# Patient Record
Sex: Male | Born: 1935 | Race: White | Hispanic: No | Marital: Married | Smoking: Former smoker
Health system: Southern US, Community
[De-identification: ages and names within clinical notes are randomized; demographics above are authoritative.]

## PROBLEM LIST (undated history)

## (undated) DIAGNOSIS — I214 Non-ST elevation (NSTEMI) myocardial infarction: Secondary | ICD-10-CM

## (undated) DIAGNOSIS — C61 Malignant neoplasm of prostate: Secondary | ICD-10-CM

## (undated) DIAGNOSIS — Z8719 Personal history of other diseases of the digestive system: Secondary | ICD-10-CM

## (undated) DIAGNOSIS — I480 Paroxysmal atrial fibrillation: Secondary | ICD-10-CM

## (undated) DIAGNOSIS — E039 Hypothyroidism, unspecified: Secondary | ICD-10-CM

## (undated) DIAGNOSIS — E871 Hypo-osmolality and hyponatremia: Secondary | ICD-10-CM

## (undated) DIAGNOSIS — I5042 Chronic combined systolic (congestive) and diastolic (congestive) heart failure: Secondary | ICD-10-CM

## (undated) DIAGNOSIS — I119 Hypertensive heart disease without heart failure: Secondary | ICD-10-CM

## (undated) DIAGNOSIS — Z951 Presence of aortocoronary bypass graft: Secondary | ICD-10-CM

## (undated) DIAGNOSIS — I10 Essential (primary) hypertension: Secondary | ICD-10-CM

## (undated) DIAGNOSIS — I440 Atrioventricular block, first degree: Secondary | ICD-10-CM

## (undated) DIAGNOSIS — Z8521 Personal history of malignant neoplasm of larynx: Secondary | ICD-10-CM

## (undated) DIAGNOSIS — R498 Other voice and resonance disorders: Secondary | ICD-10-CM

## (undated) DIAGNOSIS — K219 Gastro-esophageal reflux disease without esophagitis: Secondary | ICD-10-CM

## (undated) DIAGNOSIS — I2510 Atherosclerotic heart disease of native coronary artery without angina pectoris: Secondary | ICD-10-CM

## (undated) DIAGNOSIS — M1990 Unspecified osteoarthritis, unspecified site: Secondary | ICD-10-CM

## (undated) DIAGNOSIS — R001 Bradycardia, unspecified: Secondary | ICD-10-CM

## (undated) HISTORY — DX: Hypertensive heart disease without heart failure: I11.9

## (undated) HISTORY — DX: Bradycardia, unspecified: R00.1

## (undated) HISTORY — DX: Hypo-osmolality and hyponatremia: E87.1

## (undated) HISTORY — DX: Paroxysmal atrial fibrillation: I48.0

## (undated) HISTORY — DX: Chronic combined systolic (congestive) and diastolic (congestive) heart failure: I50.42

## (undated) HISTORY — PX: LARYNX SURGERY: SHX692

## (undated) HISTORY — PX: CATARACT EXTRACTION W/ INTRAOCULAR LENS  IMPLANT, BILATERAL: SHX1307

## (undated) HISTORY — PX: CARDIOVASCULAR STRESS TEST: SHX262

## (undated) HISTORY — DX: Atherosclerotic heart disease of native coronary artery without angina pectoris: I25.10

---

## 1998-09-27 ENCOUNTER — Ambulatory Visit (HOSPITAL_BASED_OUTPATIENT_CLINIC_OR_DEPARTMENT_OTHER): Admission: RE | Admit: 1998-09-27 | Discharge: 1998-09-27 | Payer: Self-pay | Admitting: Otolaryngology

## 2011-01-04 ENCOUNTER — Encounter (INDEPENDENT_AMBULATORY_CARE_PROVIDER_SITE_OTHER): Payer: Medicare Other | Admitting: Ophthalmology

## 2011-01-04 DIAGNOSIS — H3530 Unspecified macular degeneration: Secondary | ICD-10-CM

## 2011-01-04 DIAGNOSIS — D492 Neoplasm of unspecified behavior of bone, soft tissue, and skin: Secondary | ICD-10-CM

## 2011-01-04 DIAGNOSIS — H43819 Vitreous degeneration, unspecified eye: Secondary | ICD-10-CM

## 2011-01-22 ENCOUNTER — Other Ambulatory Visit: Payer: Self-pay | Admitting: Urology

## 2011-01-22 ENCOUNTER — Ambulatory Visit (INDEPENDENT_AMBULATORY_CARE_PROVIDER_SITE_OTHER): Payer: Medicare Other | Admitting: Urology

## 2011-01-22 DIAGNOSIS — R972 Elevated prostate specific antigen [PSA]: Secondary | ICD-10-CM

## 2011-01-22 DIAGNOSIS — N400 Enlarged prostate without lower urinary tract symptoms: Secondary | ICD-10-CM

## 2011-03-05 ENCOUNTER — Other Ambulatory Visit (HOSPITAL_COMMUNITY): Payer: Self-pay | Admitting: Urology

## 2011-03-05 ENCOUNTER — Inpatient Hospital Stay (HOSPITAL_COMMUNITY): Admission: RE | Admit: 2011-03-05 | Discharge: 2011-03-05 | Payer: Medicare Other | Source: Ambulatory Visit

## 2011-03-05 ENCOUNTER — Ambulatory Visit (HOSPITAL_COMMUNITY)
Admission: RE | Admit: 2011-03-05 | Discharge: 2011-03-05 | Disposition: A | Discharge status: Home / Self Care | Payer: Medicare Other | Source: Ambulatory Visit | Attending: Urology | Admitting: Urology

## 2011-03-05 DIAGNOSIS — N4289 Other specified disorders of prostate: Secondary | ICD-10-CM | POA: Insufficient documentation

## 2011-03-05 NOTE — Op Note (Signed)
Preoperative diagnosis: Prostate nodule, elevated PSA  Postoperative diagnosis: Same  Principal procedure: Transrectal ultrasound and biopsy of the prostate  Surgeon: Romolo Sieling  Anesthesia: 2% lidocaine, 9 cc  Complications: None  Specimens: 12 needle biopsies of the prostate, to pathology  Brief history   77 year old male who presents for ultrasound and biopsy of the prostate. He was first seen in my office here in [place], [place] in late October, 2012. At that time he was noted to have a 4-5 mm left prostatic nodule and a PSA of 9.2. The patient is on 5 alpha reductase inhibitors. With this prostate nodule and elevating PSA, it was recommended that he undergo ultrasound and biopsy of the prostate. Risks and complications have been discussed with the patient which include but are not limited to blood in the urine, semen and stool. Additionally, a 1/200 chance of developing sepsis. He understands these and desires to proceed.  Description of procedure  afterinformed consent and timeout, the patient was placed in the left lateral decubitus position. The transrectal ultrasound probe was advanced into the rectum. Images were taken of the prostate both in transverse and sagittal dimensions. Prostate measured 50.1 mL. There was a hypoechoic area noted in the left prostate in the peripheral zone, encompassing the apex and mid regions of the prostate. Measure proximally 1-1.5 cm. No other specific lesions were seen in the prostate, with seminal vesicles appear normal. After local infiltration with 2% plain lidocaine, biopsies were taken x12 in the usual sextant pattern, including the aforementioned hypoechoic area.the patient tolerated the procedure well. He was then allowed to go home at his convenience, with followup to be provided over the phone.

## 2011-03-05 NOTE — Progress Notes (Signed)
Prostate biopsy procedure started at 1325.  10 cc of Lidocaine injected into prostate.  Thirteen samples obtained and sent to pathology.  Procedure end time 1335.  Pt tolerated procedure well.

## 2011-03-05 NOTE — Discharge Instructions (Signed)
Prostate Biopsy TRUS Biopsy BEFORE THE TEST   Do not take aspirin. Do not take any medicine that has aspirin in it 7 days before your biopsy.   You may be given a medicine to take on the day of your biopsy.   You may also be given a medicine or treatment to help you go poop (laxative or enema).  AFTER THE TEST  Only take medicine as told by your doctor.   It is normal to have some bleeding from your rectum for the first 5 days.   You may have blood in your pee (urine) or sperm.  Finding out the results of your test Ask when your test results will be ready. Make sure you get your test results. GET HELP RIGHT AWAY IF:  You have a temperature by mouth above 102 F (38.9 C), not controlled by medicine.   You have blood in your pee for more than 5 days.   You have a lot of blood in your pee.   You have bleeding from your rectum for more than 5 days or have a lot of blood in your poop (feces).   You have severe pain.  Document Released: 02/27/2009 Document Revised: 11/21/2010 Document Reviewed: 02/27/2009 [facility] Patient Information 2012 [place], [place].

## 2011-04-26 ENCOUNTER — Other Ambulatory Visit: Payer: Self-pay | Admitting: Urology

## 2011-04-26 DIAGNOSIS — C61 Malignant neoplasm of prostate: Secondary | ICD-10-CM

## 2011-05-09 ENCOUNTER — Telehealth: Payer: Self-pay | Admitting: *Deleted

## 2011-05-09 ENCOUNTER — Encounter (HOSPITAL_COMMUNITY): Payer: Self-pay

## 2011-05-09 ENCOUNTER — Encounter (HOSPITAL_COMMUNITY)
Admission: RE | Admit: 2011-05-09 | Discharge: 2011-05-09 | Disposition: A | Discharge status: Home / Self Care | Payer: Medicare Other | Source: Ambulatory Visit | Attending: Urology | Admitting: Urology

## 2011-05-09 ENCOUNTER — Ambulatory Visit (HOSPITAL_COMMUNITY)
Admission: RE | Admit: 2011-05-09 | Discharge: 2011-05-09 | Disposition: A | Discharge status: Home / Self Care | Payer: Medicare Other | Source: Ambulatory Visit | Attending: Urology | Admitting: Urology

## 2011-05-09 DIAGNOSIS — C61 Malignant neoplasm of prostate: Secondary | ICD-10-CM | POA: Insufficient documentation

## 2011-05-09 HISTORY — DX: Malignant neoplasm of prostate: C61

## 2011-05-09 MED ORDER — TECHNETIUM TC 99M MEDRONATE IV KIT
24.0000 | PACK | Freq: Once | INTRAVENOUS | Status: AC | PRN
  Administered 2011-05-09: 24 via INTRAVENOUS

## 2011-05-17 NOTE — Telephone Encounter (Signed)
xxx

## 2011-05-29 ENCOUNTER — Encounter: Payer: Self-pay | Admitting: Radiation Oncology

## 2011-05-29 NOTE — Progress Notes (Signed)
  Radiation Oncology         [phone] ________________________________  Name: [name] [name]             MRN: [id number]  Date: 05/29/2011  DOB: 05-Jul-1928  [name] [name],  [name] [name] has now started radiation in [name] [name] and will finish on 4/8.  MM   ________________________________  [name] [name], M.D.

## 2011-06-05 ENCOUNTER — Encounter: Payer: Self-pay | Admitting: Radiation Oncology

## 2011-06-11 ENCOUNTER — Other Ambulatory Visit: Payer: Self-pay | Admitting: Urology

## 2011-06-21 ENCOUNTER — Other Ambulatory Visit: Payer: Self-pay | Admitting: Radiation Oncology

## 2011-06-21 DIAGNOSIS — C61 Malignant neoplasm of prostate: Secondary | ICD-10-CM

## 2011-07-03 ENCOUNTER — Encounter: Payer: Self-pay | Admitting: Radiation Oncology

## 2011-07-03 NOTE — Progress Notes (Signed)
  Radiation Oncology         [phone] ________________________________  Name: [name] [name]              MRN: [id number]  Date: 05/22/2011  DOB: 11/11/1921  SIMULATION AND TREATMENT PLANNING NOTE PUBIC ARCH STUDY  DIAGNOSIS: Adenocarcinoma of the prostate  NARRATIVE:  The patient presented today for to plan external radiation and evaluation for possible prostate seed implant boost. He was brought to the radiation planning suite and placed supine on the CT couch. A 3-dimensional image study set was obtained in upload to the planning computer. There, on each axial slice, I contoured the prostate gland. Then, using three-dimensional radiation planning tools I reconstructed the prostate in view of the structures from the transperineal needle pathway to assess for possible pubic arch interference. In doing so, I did not appreciate any pubic arch interference. Also, the patient's prostate volume was estimated based on the drawn structure. The volume was 47 cc.  Given the pubic arch appearance and prostate volume, patient remains a good candidate to proceed with prostate seed implant. Today, he freely provided informed written consent to proceed.    PLAN: The patient will undergo 5 weeks of external beam radiation treatment in her at [facility] to be followed by prostate seed implant boost. ________________________________  [name] [name], M.D.

## 2011-07-04 ENCOUNTER — Ambulatory Visit (HOSPITAL_COMMUNITY)
Admission: RE | Admit: 2011-07-04 | Discharge: 2011-07-04 | Disposition: A | Discharge status: Home / Self Care | Payer: Medicare Other | Source: Ambulatory Visit | Attending: Radiation Oncology | Admitting: Radiation Oncology

## 2011-07-04 ENCOUNTER — Other Ambulatory Visit: Payer: Self-pay

## 2011-07-04 DIAGNOSIS — R9431 Abnormal electrocardiogram [ECG] [EKG]: Secondary | ICD-10-CM | POA: Insufficient documentation

## 2011-07-04 DIAGNOSIS — R05 Cough: Secondary | ICD-10-CM | POA: Insufficient documentation

## 2011-07-04 DIAGNOSIS — R059 Cough, unspecified: Secondary | ICD-10-CM | POA: Insufficient documentation

## 2011-07-04 DIAGNOSIS — Z01810 Encounter for preprocedural cardiovascular examination: Secondary | ICD-10-CM | POA: Insufficient documentation

## 2011-07-04 DIAGNOSIS — Z01818 Encounter for other preprocedural examination: Secondary | ICD-10-CM | POA: Insufficient documentation

## 2011-07-04 DIAGNOSIS — C61 Malignant neoplasm of prostate: Secondary | ICD-10-CM

## 2011-07-26 LAB — CBC
HCT: 40.2 % (ref 39.0–52.0)
Hemoglobin: 13.5 g/dL (ref 13.0–17.0)
MCH: 29.6 pg (ref 26.0–34.0)
MCV: 88.2 fL (ref 78.0–100.0)
Platelets: 250 10*3/uL (ref 150–400)
RBC: 4.56 MIL/uL (ref 4.22–5.81)

## 2011-07-26 LAB — COMPREHENSIVE METABOLIC PANEL
BUN: 14 mg/dL (ref 6–23)
CO2: 27 mEq/L (ref 19–32)
Calcium: 9.8 mg/dL (ref 8.4–10.5)
Chloride: 100 mEq/L (ref 96–112)
Creatinine, Ser: 1.17 mg/dL (ref 0.50–1.35)
GFR calc Af Amer: 66 mL/min — ABNORMAL LOW (ref >90)
GFR calc non Af Amer: 57 mL/min — ABNORMAL LOW (ref >90)
Glucose, Bld: 97 mg/dL (ref 70–99)
Total Bilirubin: 0.4 mg/dL (ref 0.3–1.2)

## 2011-07-26 LAB — PROTIME-INR: INR: 1.06 (ref 0.00–1.49)

## 2011-07-30 ENCOUNTER — Encounter (HOSPITAL_BASED_OUTPATIENT_CLINIC_OR_DEPARTMENT_OTHER): Payer: Self-pay | Admitting: *Deleted

## 2011-07-31 ENCOUNTER — Encounter (HOSPITAL_BASED_OUTPATIENT_CLINIC_OR_DEPARTMENT_OTHER): Payer: Self-pay | Admitting: *Deleted

## 2011-07-31 NOTE — Progress Notes (Signed)
NPO AFTER MN. ARRIVES AT 0915. CURRENT LAB RESULTS IN EPIC . CXR AND EKG W/ CHART AND IN EPIC. WILL TAKE SYNTHROID AND PRILOSEC AM OF SURG. W/ SIP OF WATER AND DO ONE FLEET ENEMA.

## 2011-08-01 ENCOUNTER — Telehealth: Payer: Self-pay | Admitting: *Deleted

## 2011-08-01 NOTE — Telephone Encounter (Signed)
Called patient to remind of procedure for 08-02-11, lvm for a return call

## 2011-08-02 ENCOUNTER — Ambulatory Visit (HOSPITAL_BASED_OUTPATIENT_CLINIC_OR_DEPARTMENT_OTHER)
Admission: RE | Admit: 2011-08-02 | Discharge: 2011-08-02 | Disposition: A | Discharge status: Home / Self Care | Payer: Medicare Other | Source: Ambulatory Visit | Attending: Urology | Admitting: Urology

## 2011-08-02 ENCOUNTER — Ambulatory Visit (HOSPITAL_BASED_OUTPATIENT_CLINIC_OR_DEPARTMENT_OTHER): Payer: Medicare Other | Admitting: Anesthesiology

## 2011-08-02 ENCOUNTER — Encounter (HOSPITAL_BASED_OUTPATIENT_CLINIC_OR_DEPARTMENT_OTHER): Payer: Self-pay

## 2011-08-02 ENCOUNTER — Ambulatory Visit (HOSPITAL_COMMUNITY): Payer: Medicare Other

## 2011-08-02 ENCOUNTER — Encounter (HOSPITAL_BASED_OUTPATIENT_CLINIC_OR_DEPARTMENT_OTHER)
Admission: RE | Disposition: A | Discharge status: Home / Self Care | Payer: Self-pay | Source: Ambulatory Visit | Attending: Urology

## 2011-08-02 ENCOUNTER — Encounter (HOSPITAL_BASED_OUTPATIENT_CLINIC_OR_DEPARTMENT_OTHER): Payer: Self-pay | Admitting: Anesthesiology

## 2011-08-02 DIAGNOSIS — I10 Essential (primary) hypertension: Secondary | ICD-10-CM | POA: Insufficient documentation

## 2011-08-02 DIAGNOSIS — C61 Malignant neoplasm of prostate: Secondary | ICD-10-CM | POA: Insufficient documentation

## 2011-08-02 DIAGNOSIS — I2510 Atherosclerotic heart disease of native coronary artery without angina pectoris: Secondary | ICD-10-CM | POA: Insufficient documentation

## 2011-08-02 DIAGNOSIS — K219 Gastro-esophageal reflux disease without esophagitis: Secondary | ICD-10-CM | POA: Insufficient documentation

## 2011-08-02 DIAGNOSIS — E039 Hypothyroidism, unspecified: Secondary | ICD-10-CM | POA: Insufficient documentation

## 2011-08-02 DIAGNOSIS — Z8521 Personal history of malignant neoplasm of larynx: Secondary | ICD-10-CM | POA: Insufficient documentation

## 2011-08-02 HISTORY — DX: Atrioventricular block, first degree: I44.0

## 2011-08-02 HISTORY — DX: Personal history of malignant neoplasm of larynx: Z85.21

## 2011-08-02 HISTORY — DX: Gastro-esophageal reflux disease without esophagitis: K21.9

## 2011-08-02 HISTORY — PX: CYSTOSCOPY: SHX5120

## 2011-08-02 HISTORY — DX: Essential (primary) hypertension: I10

## 2011-08-02 HISTORY — PX: RADIOACTIVE SEED IMPLANT: SHX5150

## 2011-08-02 HISTORY — DX: Unspecified osteoarthritis, unspecified site: M19.90

## 2011-08-02 HISTORY — DX: Personal history of other diseases of the digestive system: Z87.19

## 2011-08-02 HISTORY — DX: Other voice and resonance disorders: R49.8

## 2011-08-02 HISTORY — DX: Hypothyroidism, unspecified: E03.9

## 2011-08-02 SURGERY — INSERTION, RADIATION SOURCE, PROSTATE
Anesthesia: General | Site: Prostate | Wound class: Clean Contaminated

## 2011-08-02 MED ORDER — ACETAMINOPHEN 325 MG PO TABS: 650.0000 mg | ORAL_TABLET | ORAL | Status: DC | PRN

## 2011-08-02 MED ORDER — SODIUM CHLORIDE 0.9 % IV SOLN: 250.0000 mL | INTRAVENOUS | Status: DC | PRN

## 2011-08-02 MED ORDER — FLEET ENEMA 7-19 GM/118ML RE ENEM: 1.0000 | ENEMA | Freq: Once | RECTAL | Status: DC

## 2011-08-02 MED ORDER — MEPERIDINE HCL 25 MG/ML IJ SOLN: 6.2500 mg | INTRAMUSCULAR | Status: DC | PRN

## 2011-08-02 MED ORDER — STERILE WATER FOR IRRIGATION IR SOLN
Status: DC | PRN
  Administered 2011-08-02: 3000 mL

## 2011-08-02 MED ORDER — LACTATED RINGERS IV SOLN: INTRAVENOUS | Status: DC

## 2011-08-02 MED ORDER — DEXAMETHASONE SODIUM PHOSPHATE 4 MG/ML IJ SOLN
INTRAMUSCULAR | Status: DC | PRN
  Administered 2011-08-02: 10 mg via INTRAVENOUS

## 2011-08-02 MED ORDER — HYDROCODONE-ACETAMINOPHEN 5-500 MG PO CAPS: 1.0000 | ORAL_CAPSULE | ORAL | Status: AC | PRN

## 2011-08-02 MED ORDER — SODIUM CHLORIDE 0.9 % IJ SOLN: 3.0000 mL | INTRAMUSCULAR | Status: DC | PRN

## 2011-08-02 MED ORDER — SODIUM CHLORIDE 0.9 % IJ SOLN: 3.0000 mL | Freq: Two times a day (BID) | INTRAMUSCULAR | Status: DC

## 2011-08-02 MED ORDER — CIPROFLOXACIN HCL 250 MG PO TABS: 250.0000 mg | ORAL_TABLET | Freq: Two times a day (BID) | ORAL | Status: AC

## 2011-08-02 MED ORDER — IOHEXOL 350 MG/ML SOLN
INTRAVENOUS | Status: DC | PRN
  Administered 2011-08-02: 4 mL

## 2011-08-02 MED ORDER — OXYCODONE HCL 5 MG PO TABS: 5.0000 mg | ORAL_TABLET | ORAL | Status: DC | PRN

## 2011-08-02 MED ORDER — LIDOCAINE HCL (CARDIAC) 20 MG/ML IV SOLN
INTRAVENOUS | Status: DC | PRN
  Administered 2011-08-02: 50 mg via INTRAVENOUS

## 2011-08-02 MED ORDER — LACTATED RINGERS IV SOLN
INTRAVENOUS | Status: DC
  Administered 2011-08-02 (×3): via INTRAVENOUS

## 2011-08-02 MED ORDER — GLYCOPYRROLATE 0.2 MG/ML IJ SOLN
INTRAMUSCULAR | Status: DC | PRN
  Administered 2011-08-02: 0.2 mg via INTRAVENOUS

## 2011-08-02 MED ORDER — CIPROFLOXACIN IN D5W 400 MG/200ML IV SOLN
400.0000 mg | INTRAVENOUS | Status: AC
  Administered 2011-08-02: 400 mg via INTRAVENOUS

## 2011-08-02 MED ORDER — ONDANSETRON HCL 4 MG/2ML IJ SOLN: 4.0000 mg | Freq: Four times a day (QID) | INTRAMUSCULAR | Status: DC | PRN

## 2011-08-02 MED ORDER — MORPHINE SULFATE 2 MG/ML IJ SOLN: 2.0000 mg | INTRAMUSCULAR | Status: DC | PRN

## 2011-08-02 MED ORDER — PROMETHAZINE HCL 25 MG/ML IJ SOLN: 6.2500 mg | INTRAMUSCULAR | Status: DC | PRN

## 2011-08-02 MED ORDER — ACETAMINOPHEN 650 MG RE SUPP: 650.0000 mg | RECTAL | Status: DC | PRN

## 2011-08-02 MED ORDER — PROPOFOL 10 MG/ML IV EMUL
INTRAVENOUS | Status: DC | PRN
  Administered 2011-08-02: 150 mg via INTRAVENOUS

## 2011-08-02 MED ORDER — FENTANYL CITRATE 0.05 MG/ML IJ SOLN: 25.0000 ug | INTRAMUSCULAR | Status: DC | PRN

## 2011-08-02 MED ORDER — FENTANYL CITRATE 0.05 MG/ML IJ SOLN
INTRAMUSCULAR | Status: DC | PRN
  Administered 2011-08-02: 25 ug via INTRAVENOUS
  Administered 2011-08-02: 50 ug via INTRAVENOUS
  Administered 2011-08-02: 25 ug via INTRAVENOUS

## 2011-08-02 SURGICAL SUPPLY — 23 items
BLADE SURG ROTATE 9660 (MISCELLANEOUS) ×3 IMPLANT
CATH FOLEY 2WAY SLVR  5CC 16FR (CATHETERS) ×1
CATH FOLEY 2WAY SLVR 5CC 16FR (CATHETERS) ×2 IMPLANT
CATH ROBINSON RED A/P 20FR (CATHETERS) ×3 IMPLANT
CLOTH BEACON ORANGE TIMEOUT ST (SAFETY) ×3 IMPLANT
COVER MAYO STAND STRL (DRAPES) ×3 IMPLANT
COVER TABLE BACK 60X90 (DRAPES) ×3 IMPLANT
DRSG TEGADERM 4X4.75 (GAUZE/BANDAGES/DRESSINGS) ×3 IMPLANT
DRSG TEGADERM 8X12 (GAUZE/BANDAGES/DRESSINGS) ×3 IMPLANT
GLOVE BIO SURGEON STRL SZ8 (GLOVE) ×6 IMPLANT
GLOVE ECLIPSE 8.0 STRL XLNG CF (GLOVE) ×3 IMPLANT
GLOVE INDICATOR 6.5 STRL GRN (GLOVE) ×1 IMPLANT
GOWN STRL REIN XL XLG (GOWN DISPOSABLE) ×3 IMPLANT
GOWN XL W/COTTON TOWEL STD (GOWNS) ×3 IMPLANT
HOLDER FOLEY CATH W/STRAP (MISCELLANEOUS) ×3 IMPLANT
KIT SEEDNET PRECISE PROCEDURE (KITS) ×1 IMPLANT
PACK CYSTOSCOPY (CUSTOM PROCEDURE TRAY) ×3 IMPLANT
SPONGE GAUZE 4X4 12PLY (GAUZE/BANDAGES/DRESSINGS) ×1 IMPLANT
SYRINGE 10CC LL (SYRINGE) ×3 IMPLANT
TOWEL OR 17X24 6PK STRL BLUE (TOWEL DISPOSABLE) ×1 IMPLANT
UNDERPAD 30X30 INCONTINENT (UNDERPADS AND DIAPERS) ×6 IMPLANT
WATER STERILE IRR 3000ML UROMA (IV SOLUTION) ×3 IMPLANT
WATER STERILE IRR 500ML POUR (IV SOLUTION) ×3 IMPLANT

## 2011-08-02 NOTE — Anesthesia Postprocedure Evaluation (Signed)
  Anesthesia Post-op Note  Patient: [name] [name]  Procedure(s) Performed: Procedure(s) (LRB): RADIOACTIVE SEED IMPLANT (N/A) CYSTOSCOPY FLEXIBLE (N/A)  Patient Location: PACU  Anesthesia Type: General  Level of Consciousness: awake and alert   Airway and Oxygen Therapy: Patient Spontanous Breathing  Post-op Pain: mild  Post-op Assessment: Post-op Vital signs reviewed, Patient's Cardiovascular Status Stable, Respiratory Function Stable, Patent Airway and No signs of Nausea or vomiting  Post-op Vital Signs: stable  Complications: No apparent anesthesia complications

## 2011-08-02 NOTE — Transfer of Care (Signed)
Immediate Anesthesia Transfer of Care Note  Patient: [name] [name]  Procedure(s) Performed: Procedure(s) (LRB): RADIOACTIVE SEED IMPLANT (N/A) CYSTOSCOPY FLEXIBLE (N/A)  Patient Location: PACU  Anesthesia Type: General  Level of Consciousness: awake and sedated  Airway & Oxygen Therapy: Patient Spontanous Breathing and Patient connected to face mask oxygen  Post-op Assessment: Report given to PACU RN and Post -op Vital signs reviewed and stable  Post vital signs: Reviewed and stable  Complications: No apparent anesthesia complications

## 2011-08-02 NOTE — Progress Notes (Signed)
Geiger survey - neg 

## 2011-08-02 NOTE — Anesthesia Preprocedure Evaluation (Addendum)
Anesthesia Evaluation  Patient identified by MRN, date of birth, ID band Patient awake    Reviewed: Allergy & Precautions, H&P , NPO status , Patient's Chart, lab work & pertinent test results  Airway Mallampati: II TM Distance: >3 FB Neck ROM: Full    Dental No notable dental hx. (+) Edentulous Upper and Edentulous Lower   Pulmonary neg pulmonary ROS, COPDformer smoker breath sounds clear to auscultation  Pulmonary exam normal       Cardiovascular hypertension, Pt. on medications negative cardio ROS  - dysrhythmias - Valvular Problems/MurmursRhythm:Regular Rate:Normal     Neuro/Psych negative neurological ROS  negative psych ROS   GI/Hepatic negative GI ROS, Neg liver ROS, hiatal hernia, GERD-  Medicated and Controlled,  Endo/Other  negative endocrine ROSHypothyroidism   Renal/GU negative Renal ROS  negative genitourinary   Musculoskeletal negative musculoskeletal ROS (+)   Abdominal   Peds negative pediatric ROS (+)  Hematology negative hematology ROS (+)   Anesthesia Other Findings H/o laryngeal cancer s/p partial resection  Reproductive/Obstetrics negative OB ROS                          Anesthesia Physical Anesthesia Plan  ASA: III  Anesthesia Plan: General   Post-op Pain Management:    Induction: Intravenous  Airway Management Planned: LMA  Additional Equipment:   Intra-op Plan:   Post-operative Plan: Extubation in OR  Informed Consent: I have reviewed the patients History and Physical, chart, labs and discussed the procedure including the risks, benefits and alternatives for the proposed anesthesia with the patient or authorized representative who has indicated his/her understanding and acceptance.   Dental advisory given  Plan Discussed with: CRNA  Anesthesia Plan Comments:         Anesthesia Quick Evaluation

## 2011-08-02 NOTE — Discharge Instructions (Signed)
Radioactive Seed Implant Home Care Instructions   Activity:    Rest for the remainder of the day.  Do not drive or operate equipment today.  You may resume normal activities in a few days as instructed by your physician, without risk of harmful radiation exposure to those around you, provided you follow  the time and distance precautions on the Radiation Oncology Instruction Sheet.   Meals: Drink plenty of lipuids and eat light foods, such as gelatin or soup this evening .  You may return to normal meal   plan tomorrow.  Return To Work: You may return to work as instructed by Designer, multimedia.    Call your physician if any of these symptoms occur:   Persistent or heavy bleeding  Urine stream diminishes or stops completely after catheter is removed  Fever equal to or greater than 101 degrees F  Cloudy urine with a strong foul odor  Severe pain  You may feel some burning pain and/or hesitancy when you urinate after the catheter is removed.  These symptoms may increase over the next few weeks, but should diminish within forur to six weeks.  Applying moist heat to the lower abdomen or a hot tub bath may help relieve the pain.  If the discomfort becomes severe, please call your physician for additional medications.  Follow-up (Date of Return Visit to Physician): ***  Patient:_______________________________   @date @  Nurse:________________________________ @date @   Post Anesthesia Home Care Instructions  Activity: Get plenty of rest for the remainder of the day. A responsible adult should stay with you for 24 hours following the procedure.  For the next 24 hours, DO NOT: -Drive a car -Advertising copywriter -Drink alcoholic beverages -Take any medication unless instructed by your physician -Make any legal decisions or sign important papers.  Meals: Start with liquid foods such as gelatin or soup. Progress to regular foods as tolerated. Avoid greasy, spicy, heavy foods. If nausea  and/or vomiting occur, drink only clear liquids until the nausea and/or vomiting subsides. Call your physician if vomiting continues.  Special Instructions/Symptoms: Your throat may feel dry or sore from the anesthesia or the breathing tube placed in your throat during surgery. If this causes discomfort, gargle with warm salt water. The discomfort should disappear within 24 hours.

## 2011-08-02 NOTE — H&P (Signed)
Urology History and Physical Exam  CC: Prostate cancer  HPI: 78 year old male presents for brachytherapy. His history is  as follows:  I first saw him recently in our office in [place], [place]. At that time, his PSA was 9.20, with a significant nodule on his left prostate. He was on Avodart at that time. Within the past 2-3 years, his PSA was 12 prior to initiation of the Avodart. It was recommended at that time that he have a biopsy by Dr. Rito Ehrlich. The patient deferred on this.  Recent ultrasound and biopsy was performed on 03/05/2011. 5/12 biopsies were positive as follows:  Left base lateral, Gleason 4+3, 60% of core Left mid medial, Gleason 4+3, 80% of core Left mid lateral, Gleason 3+4, 80% of core Left apex medial, Gleason 3+4, 10% of core Left apex lateral, Gleason 3+4, 70% of core  Prostatic size was 50 cc. He does not have significant urinary symptomatology.  He was referred for XRT to Dr. Kathrynn Running, and has received EBRT in 25 fractions. He is here for brachytherapy to complete combination radiotherapy.    PMH: Past Medical History  Diagnosis Date  . Hypertension   . GERD (gastroesophageal reflux disease)   . Prostate cancer STAGE T2a,      FOLLOWED BY DR Retta Diones AND DR MANNING  . H/O hiatal hernia   . Coronary atherosclerosis PER CT SCAN DONE 05-09-2011     CALCIFIED PLAQUE  . History of cancer of larynx 1996--- S/P  REMOVAL 1/3 VOICE BOX  AND RADIATION TX    NO RECURRENCE---  RESIDUAL , PT SPEECH A WHISPER  . Weakness of voice PT CAN ONLY WHISPER---  SECONDARY TO VOCAL CORD CA  S/P REMOVAL 1/3  VOICE BOX  . Hypothyroidism   . Heart murmur MILD--  ASYMPTOMATIC  . First degree heart block   . DJD (degenerative joint disease)     PSH: Past Surgical History  Procedure Date  . Larynx surgery x3  1996    BX'S AND 1/3 OF VOICE BOX REMOVED DUE TO CANCER--  NO RECURRENCE---  (RESIDUAL , WHISPERS)  . Cataract extraction w/ intraocular lens  implant,  bilateral   . Cardiovascular stress test 5 YRS AGO    PT STATES NORMAL    Allergies: Allergies  Allergen Reactions  . Sulfa Antibiotics Palpitations  . Contrast Media (Iodinated Diagnostic Agents) Rash    ivp dye---  Pt states w/ pre-treatment does okay    Medications: No prescriptions prior to admission     Social History: History   Social History  . Marital Status: Married    Spouse Name: N/A    Number of Children: N/A  . Years of Education: N/A   Occupational History  . Not on file.   Social History Main Topics  . Smoking status: Former Smoker -- 3.0 packs/day for 45 years    Types: Cigarettes    Quit date: 07/31/1990  . Smokeless tobacco: Never Used  . Alcohol Use: No  . Drug Use: No  . Sexually Active:    Other Topics Concern  . Not on file   Social History Narrative  . No narrative on file    Family History: History reviewed. No pertinent family history.  Review of Systems: Genitourinary, constitutional, skin, eye, otolaryngeal, hematologic/lymphatic, cardiovascular, pulmonary, endocrine, musculoskeletal, gastrointestinal, neurological and psychiatric system(s) were reviewed and pertinent findings if present are noted.  Genitourinary: urinary stream starts and stops and erectile dysfunction.  Gastrointestinal: heartburn.  ENT: sore throat  and sinus problems.  Respiratory: shortness of breath.  Musculoskeletal: back pain and joint pain.   Physical Exam: @VITALS2 @ Constitutional: Well nourished and well developed . No acute distress.  ENT:. The ears and nose are normal in appearance.  Neck: The appearance of the neck is normal.  Pulmonary: No respiratory distress and normal respiratory rhythm and effort.  Abdomen: No hernias are palpable.  Rectal: Rectal exam demonstrates normal sphincter tone and the anus is normal on inspection. Estimated prostate size is 2+. The prostate has a palpable nodule (4 mm) involving the left, apex of the prostate which  appears to be confined within the prostate capsule. The left seminal vesicle is nonpalpable. The right seminal vesicle is nonpalpable.  Genitourinary: Examination of the penis demonstrates no discharge, no masses, no lesions and a normal meatus. The penis is uncircumcised. The scrotum is without lesions. The right epididymis is palpably normal and non-tender. The left epididymis is palpably normal and non-tender. The right testis is palpably normal, non-tender and without masses. The left testis is normal, non-tender and without masses.  Studies:  No results found for this basename: HGB:2,WBC:2,PLT:2 in the last 72 hours  No results found for this basename: NA:2,K:2,CL:2,CO2:2,BUN:2,CREATININE:2,CALCIUM:2,MAGNESIUM:2,GFRNONAA:2,GFRAA:2 in the last 72 hours   No results found for this basename: PT:2,INR:2,APTT:2 in the last 72 hours   No components found with this basename: ABG:2    Assessment:  Clinical stage T2 A. Adenocarcinoma the prostate, Gleason 4+3  Plan: I-125 brachytherapy of the prostate to complete combination radiotherapy

## 2011-08-02 NOTE — OR Nursing (Signed)
89 Seeds -

## 2011-08-02 NOTE — Progress Notes (Signed)
Pt voided 50 cc's; pvr via scan was 100 cc's.  Dr. Retta Diones called and informed.  Ok for d/c to home.  Pt encouraged to force fluids today.

## 2011-08-02 NOTE — Op Note (Signed)
Preoperative diagnosis: Clinical stage TI C adenocarcinoma the prostate   Postoperative diagnosis: Same   Procedure: I-125 prostate seed implantation with Nucletron robotic implanter   Surgeon: Bertram Millard. Gust Eugene M.D.  Radiation Oncologist:Manning  Anesthesia: Gen.   Indications: Patient  was diagnosed with clinical stage TI C prostate cancer. We had extensive discussion with him about treatment options versus. He elected to proceed with seed implantation. He underwent consultation my office as well as with Dr. Chipper Herb. He appeared to understand the advantages disadvantages potential risks of this treatment option. Full informed consent has been obtained.   Technique and findings: Patient was brought the operating room where he had successful induction of general anesthesia. He was placed in dorso-lithotomy position and prepped and draped in usual manner. Appropriate surgical timeout was performed. Radiation oncology department placed a transrectal ultrasound probe anchoring stand. Foley catheter with contrast in the balloon was inserted without difficulty. Anchoring needles were placed within the prostate. Rectal tube was placed. Real-time contouring of the urethra prostate and rectum were performed and the dosing parameters were established. Targeted dose was 110 gray.  I was then called  to the operating suite suite for placement of the needles. A second timeout was performed. All needle passage was done with real-time transrectal ultrasound guidance with the sagittal plane. A total of 22 needles were placed. The implantation itself was done with the robotic implanter. 89 active seeds were implanted. A Foley catheter was removed and flexible cystoscopy failed to show any seeds outside the prostate. The Foley catheter was inserted which drained clear urine. The patient was brought to recovery room in stable condition.

## 2011-08-02 NOTE — Anesthesia Procedure Notes (Signed)
Procedure Name: LMA Insertion Date/Time: 08/02/2011 10:58 AM Performed by: Gar Gibbon Pre-anesthesia Checklist: Patient identified, Emergency Drugs available, Suction available and Patient being monitored Patient Re-evaluated:Patient Re-evaluated prior to inductionOxygen Delivery Method: Circle System Utilized Preoxygenation: Pre-oxygenation with 100% oxygen Intubation Type: IV induction Ventilation: Mask ventilation without difficulty LMA: LMA inserted LMA Size: 4.0 Number of attempts: 1 Airway Equipment and Method: bite block Placement Confirmation: positive ETCO2 Tube secured with: Tape Dental Injury: Teeth and Oropharynx as per pre-operative assessment

## 2011-08-05 ENCOUNTER — Encounter (HOSPITAL_BASED_OUTPATIENT_CLINIC_OR_DEPARTMENT_OTHER): Payer: Self-pay | Admitting: Urology

## 2011-08-05 ENCOUNTER — Encounter (INDEPENDENT_AMBULATORY_CARE_PROVIDER_SITE_OTHER): Payer: Medicare Other | Admitting: Ophthalmology

## 2011-08-05 DIAGNOSIS — H353290 Exudative age-related macular degeneration, unspecified eye, stage unspecified: Secondary | ICD-10-CM

## 2011-08-05 DIAGNOSIS — H43819 Vitreous degeneration, unspecified eye: Secondary | ICD-10-CM

## 2011-08-05 DIAGNOSIS — H3530 Unspecified macular degeneration: Secondary | ICD-10-CM

## 2011-08-05 NOTE — Progress Notes (Signed)
  Radiation Oncology         [phone] ________________________________  Name: [name] [name] MRN: [id number]  Date: 06/10/2011  DOB: Jun 20, 1928       Prostate Seed Implant  DIAGNOSIS: A 78 year old gentlemen with stage T2a adenocarcinoma of the prostate with a Gleason of 4+3 and a PSA of 9.2 (on avodart).  PROCEDURE: Insertion of radioactive I-125 seeds into the prostate gland.  RADIATION DOSE: 110 Gy, boost therapy.  TECHNIQUE: [name] [name] was brought to the operating room with the urologist. He was placed in the dorsolithotomy position. He was catheterized and a rectal tube was inserted. The perineum was shaved, prepped and draped. The ultrasound probe was then introduced into the rectum to see the prostate gland.  TREATMENT DEVICE: A needle grid was attached to the ultrasound probe stand and anchor needles were placed.  COMPLEX ISODOSE CALCULATION: The prostate was imaged in 3D using a sagittal sweep of the prostate probe. These images were transferred to the planning computer. There, the prostate, urethra and rectum were defined on each axial reconstructed image. Then, the software created an optimized plan and a few seed positions were adjusted. Then the accepted plan was uploaded to the seed Selectron afterloading unit.  SPECIAL TREATMENT PROCEDURE/SUPERVISION AND HANDLING: The Nucletron FIRST system was used to place the needles under sagittal guidance. A total of 24 needles were used to deposit 89 seeds in the prostate gland. The individual seed activity was 0.359 mCi for a total implant activity of 31.951 mCi.  COMPLEX SIMULATION: At the end of the procedure, an anterior radiograph of the pelvis was obtained to document seed positioning and count. Cystoscopy was performed to check the urethra and bladder.  MICRODOSIMETRY: At the end of the procedure, the patient was emitting 0.317 mrem/hr at 1 meter. Accordingly, he was considered safe for hospital discharge.  PLAN: The patient  will return to the radiation oncology clinic for post implant CT dosimetry in three weeks.   ________________________________  Artist Pais Kathrynn Running, M.D.

## 2011-08-08 ENCOUNTER — Ambulatory Visit (INDEPENDENT_AMBULATORY_CARE_PROVIDER_SITE_OTHER): Payer: Medicare Other | Admitting: Ophthalmology

## 2011-08-08 DIAGNOSIS — H43819 Vitreous degeneration, unspecified eye: Secondary | ICD-10-CM

## 2011-08-08 DIAGNOSIS — H3530 Unspecified macular degeneration: Secondary | ICD-10-CM

## 2011-08-08 DIAGNOSIS — H353290 Exudative age-related macular degeneration, unspecified eye, stage unspecified: Secondary | ICD-10-CM

## 2011-08-21 ENCOUNTER — Telehealth: Payer: Self-pay | Admitting: *Deleted

## 2011-08-21 NOTE — Telephone Encounter (Signed)
CALLED PATIENT TO REMIND OF APPTS. FOR 08-22-11, CONFIRMED APPTS.

## 2011-08-22 ENCOUNTER — Ambulatory Visit
Admit: 2011-08-22 | Discharge: 2011-08-22 | Disposition: A | Discharge status: Home / Self Care | Payer: Medicare Other | Attending: Radiation Oncology | Admitting: Radiation Oncology

## 2011-08-22 ENCOUNTER — Encounter: Payer: Self-pay | Admitting: Radiation Oncology

## 2011-08-22 VITALS — BP 127/76 | HR 64 | Temp 96.6°F | Wt 189.0 lb

## 2011-08-22 DIAGNOSIS — C61 Malignant neoplasm of prostate: Secondary | ICD-10-CM

## 2011-08-22 NOTE — Progress Notes (Signed)
  Radiation Oncology         [phone] ________________________________  Name: [name] [name] MRN: [id number]  Date: 08/22/2011  DOB: [birth year]  COMPLEX SIMULATION NOTE  NARRATIVE:  The patient was brought to the CT Simulation planning suite today following prostate seed implantation approximately one month ago.  Identity was confirmed.  All relevant records and images related to the planned course of therapy were reviewed.  Then, the patient was set-up supine.  CT images were obtained.  The CT images were loaded into the planning software.  Then the prostate and rectum were contoured.  Treatment planning then occurred.  The implanted iodine 125 seeds were identified by the physics staff for projection of radiation distribution  I have requested : 3D Simulation  I have requested a DVH of the following structures: Prostate and rectum.    ________________________________  Artist Pais Kathrynn Running, M.D.

## 2011-08-22 NOTE — Progress Notes (Signed)
Radiation Oncology         (336) [phone] ________________________________  Name: [name] [name] MRN: [id number]  Date: 08/22/2011  DOB: 1928-11-16  Follow-Up Visit Note  CC: Isabella Stalling, MD, MD  Marcine Matar, MD  Diagnosis:   78 year old gentlemen with stage T2a adenocarcinoma of the prostate with a Gleason of 4+3 and a PSA of 9.2 (on avodart).  Interval Since Last Radiation:  3 months  Narrative:  The patient returns today for routine follow-up.  He is complaining of increased urinary frequency and urinary hesitation symptoms. He filled out a questionnaire regarding urinary function today providing and overall IPSS score of 17 characterizing his symptoms as moderate.  His pre-implant score was 1. He denies any bowel symptoms.  ALLERGIES:  is allergic to sulfa antibiotics and contrast media.  Meds: Current Outpatient Prescriptions  Medication Sig Dispense Refill  . amLODipine-benazepril (LOTREL) 10-20 MG per capsule Take 1 capsule by mouth daily.       Marland Kitchen aspirin 81 MG chewable tablet Chew 81 mg by mouth daily.       . beta carotene w/minerals (OCUVITE) tablet Take 1 tablet by mouth daily.       Marland Kitchen dutasteride (AVODART) 0.5 MG capsule Take 0.5 mg by mouth daily.       Marland Kitchen levothyroxine (SYNTHROID, LEVOTHROID) 50 MCG tablet Take 50 mcg by mouth every morning.      Marland Kitchen omeprazole (PRILOSEC) 20 MG capsule Take 20 mg by mouth every morning.       . valsartan-hydrochlorothiazide (DIOVAN-HCT) 320-12.5 MG per tablet Take 1 tablet by mouth every morning.         Physical Findings: The patient is in no acute distress. Patient is alert and oriented. Filed Vitals:   08/22/11 1519  BP: 127/76  Pulse: 64  Temp: 96.6 F (35.9 C)   Filed Weights   08/22/11 1519  Weight: 189 lb (85.73 kg)   .  No significant changes.  Lab Findings: Lab Results  Component Value Date   WBC 4.9 07/26/2011   HGB 13.5 07/26/2011   HCT 40.2 07/26/2011   MCV 88.2 07/26/2011   PLT 250 07/26/2011     Radiographic Findings:  Patient underwent CT imaging in our clinic for post implant dosimetry. The CT appears to demonstrate an adequate distribution of radioactive seeds throughout the prostate gland. There no seeds in her near the rectum. I suspect the final radiation plan and dosimetry will show appropriate coverage of the prostate gland.   Impression: The patient is recovering from the effects of radiation. His urinary symptoms should gradually improve over the next 4-6 months. We talked about this today. He is encouraged by his improvement already and is otherwise please with his outcome.   Plan: Today, I spent time talking to the patient about his prostate seed implant and resolving urinary symptoms. Which for long-term followup for prostate cancer following seed implant. He understands that ongoing PSA determinations and digital rectal exams will help perform surveillance to rule out disease recurrence. He understands what to expect with his PSA measures. Patient was also educated today about some of the long-term effects would radiation including the Small risk for rectal bleeding and possibly erectile dysfunction. Talked about some of the general management approaches to these potential complications. However, I did encourage the patient to contact her office or return at any point if he has questions or concerns related to his previous radiation and prostate cancer.   _____________________________________  Artist Pais. Kathrynn Running, M.D.

## 2011-08-22 NOTE — Progress Notes (Signed)
Here for routine follow up post prostate seed implant on 08/02/11.Denies pain. IPSS score 17. Nocturia x 3.Urinary patterns have increased at night but the same during the day. Burning mostly on first urination in the morning.Bowels have increased mostly soft and states he must sit to make sure hd doesn't have an accident. Increased bowel sensation has improved over the last week. Wife recently home after open heart surgery.

## 2011-08-30 ENCOUNTER — Encounter (INDEPENDENT_AMBULATORY_CARE_PROVIDER_SITE_OTHER): Payer: Medicare Other | Admitting: Ophthalmology

## 2011-08-30 DIAGNOSIS — H353290 Exudative age-related macular degeneration, unspecified eye, stage unspecified: Secondary | ICD-10-CM

## 2011-08-30 DIAGNOSIS — H3530 Unspecified macular degeneration: Secondary | ICD-10-CM

## 2011-08-30 DIAGNOSIS — H43819 Vitreous degeneration, unspecified eye: Secondary | ICD-10-CM

## 2011-09-16 ENCOUNTER — Encounter: Payer: Self-pay | Admitting: Radiation Oncology

## 2011-09-18 NOTE — Progress Notes (Signed)
  Radiation Oncology         (336) [phone] ________________________________  Name: [name] [name] MRN: [id number]  Date: 09/16/2011  DOB: 05-Dec-1928  3-D Planning Note Prostate Brachytherapy  Narrative: [name] returned following prostate seed implantation for post implant planning. He underwent CT scan to delineate the three-dimensional structures of the pelvis and demonstrate the radiation distribution.  Results:   Prostate Coverage - The dose of radiation delivered to the 90% or more of the prostate gland (D90) was 109.44% of the prescription dose. This exceeds our goal of greater than 90%. Rectal Sparing - The volume of rectal tissue receiving the prescription dose or higher was 0.45 cc. This falls under our thresholds tolerance of 1.0 cc.  Impression: The prostate seed implant appears to show adequate target coverage and appropriate rectal sparing.  Plan:  The patient will continue to follow with urology for ongoing PSA determinations. I would anticipate a high likelihood for local tumor control with minimal risk for rectal morbidity.   Artist Pais Kathrynn Running, M.D.

## 2011-09-30 ENCOUNTER — Encounter (INDEPENDENT_AMBULATORY_CARE_PROVIDER_SITE_OTHER): Payer: Medicare Other | Admitting: Ophthalmology

## 2011-09-30 DIAGNOSIS — H43399 Other vitreous opacities, unspecified eye: Secondary | ICD-10-CM

## 2011-09-30 DIAGNOSIS — H353290 Exudative age-related macular degeneration, unspecified eye, stage unspecified: Secondary | ICD-10-CM

## 2011-09-30 DIAGNOSIS — H2700 Aphakia, unspecified eye: Secondary | ICD-10-CM

## 2011-09-30 DIAGNOSIS — H3530 Unspecified macular degeneration: Secondary | ICD-10-CM

## 2011-10-04 ENCOUNTER — Encounter (INDEPENDENT_AMBULATORY_CARE_PROVIDER_SITE_OTHER): Payer: Medicare Other | Admitting: Ophthalmology

## 2011-11-04 ENCOUNTER — Encounter (INDEPENDENT_AMBULATORY_CARE_PROVIDER_SITE_OTHER): Payer: Medicare Other | Admitting: Ophthalmology

## 2011-11-04 DIAGNOSIS — H35039 Hypertensive retinopathy, unspecified eye: Secondary | ICD-10-CM

## 2011-11-04 DIAGNOSIS — H43819 Vitreous degeneration, unspecified eye: Secondary | ICD-10-CM

## 2011-11-04 DIAGNOSIS — H353290 Exudative age-related macular degeneration, unspecified eye, stage unspecified: Secondary | ICD-10-CM

## 2011-11-04 DIAGNOSIS — H3530 Unspecified macular degeneration: Secondary | ICD-10-CM

## 2011-11-19 ENCOUNTER — Ambulatory Visit (INDEPENDENT_AMBULATORY_CARE_PROVIDER_SITE_OTHER): Payer: Medicare Other | Admitting: Urology

## 2011-11-19 DIAGNOSIS — C61 Malignant neoplasm of prostate: Secondary | ICD-10-CM

## 2011-12-06 ENCOUNTER — Encounter: Payer: Self-pay | Admitting: *Deleted

## 2011-12-16 ENCOUNTER — Encounter (INDEPENDENT_AMBULATORY_CARE_PROVIDER_SITE_OTHER): Payer: Medicare Other | Admitting: Ophthalmology

## 2011-12-16 DIAGNOSIS — H353290 Exudative age-related macular degeneration, unspecified eye, stage unspecified: Secondary | ICD-10-CM

## 2011-12-16 DIAGNOSIS — H43819 Vitreous degeneration, unspecified eye: Secondary | ICD-10-CM

## 2011-12-16 DIAGNOSIS — I10 Essential (primary) hypertension: Secondary | ICD-10-CM

## 2011-12-16 DIAGNOSIS — H3530 Unspecified macular degeneration: Secondary | ICD-10-CM

## 2011-12-16 DIAGNOSIS — H35039 Hypertensive retinopathy, unspecified eye: Secondary | ICD-10-CM

## 2012-02-03 ENCOUNTER — Encounter (INDEPENDENT_AMBULATORY_CARE_PROVIDER_SITE_OTHER): Payer: Medicare Other | Admitting: Ophthalmology

## 2012-02-03 DIAGNOSIS — I10 Essential (primary) hypertension: Secondary | ICD-10-CM

## 2012-02-03 DIAGNOSIS — H3530 Unspecified macular degeneration: Secondary | ICD-10-CM

## 2012-02-03 DIAGNOSIS — H43819 Vitreous degeneration, unspecified eye: Secondary | ICD-10-CM

## 2012-02-03 DIAGNOSIS — H353290 Exudative age-related macular degeneration, unspecified eye, stage unspecified: Secondary | ICD-10-CM

## 2012-02-03 DIAGNOSIS — H35039 Hypertensive retinopathy, unspecified eye: Secondary | ICD-10-CM

## 2012-02-18 ENCOUNTER — Ambulatory Visit (INDEPENDENT_AMBULATORY_CARE_PROVIDER_SITE_OTHER): Payer: Medicare Other | Admitting: Urology

## 2012-02-18 DIAGNOSIS — C61 Malignant neoplasm of prostate: Secondary | ICD-10-CM

## 2012-03-30 ENCOUNTER — Encounter (INDEPENDENT_AMBULATORY_CARE_PROVIDER_SITE_OTHER): Payer: Medicare Other | Admitting: Ophthalmology

## 2012-03-30 DIAGNOSIS — I10 Essential (primary) hypertension: Secondary | ICD-10-CM

## 2012-03-30 DIAGNOSIS — H3530 Unspecified macular degeneration: Secondary | ICD-10-CM

## 2012-03-30 DIAGNOSIS — H43819 Vitreous degeneration, unspecified eye: Secondary | ICD-10-CM

## 2012-03-30 DIAGNOSIS — H353290 Exudative age-related macular degeneration, unspecified eye, stage unspecified: Secondary | ICD-10-CM

## 2012-03-30 DIAGNOSIS — H35039 Hypertensive retinopathy, unspecified eye: Secondary | ICD-10-CM

## 2012-06-08 ENCOUNTER — Encounter (INDEPENDENT_AMBULATORY_CARE_PROVIDER_SITE_OTHER): Payer: Medicare Other | Admitting: Ophthalmology

## 2012-06-08 DIAGNOSIS — H3530 Unspecified macular degeneration: Secondary | ICD-10-CM

## 2012-06-08 DIAGNOSIS — H35039 Hypertensive retinopathy, unspecified eye: Secondary | ICD-10-CM

## 2012-06-08 DIAGNOSIS — I10 Essential (primary) hypertension: Secondary | ICD-10-CM

## 2012-06-08 DIAGNOSIS — H43819 Vitreous degeneration, unspecified eye: Secondary | ICD-10-CM

## 2012-06-16 ENCOUNTER — Ambulatory Visit (INDEPENDENT_AMBULATORY_CARE_PROVIDER_SITE_OTHER): Payer: Medicare Other | Admitting: Urology

## 2012-06-16 DIAGNOSIS — C61 Malignant neoplasm of prostate: Secondary | ICD-10-CM

## 2012-08-03 ENCOUNTER — Encounter (INDEPENDENT_AMBULATORY_CARE_PROVIDER_SITE_OTHER): Payer: Medicare Other | Admitting: Ophthalmology

## 2012-08-03 DIAGNOSIS — H35039 Hypertensive retinopathy, unspecified eye: Secondary | ICD-10-CM

## 2012-08-03 DIAGNOSIS — H43819 Vitreous degeneration, unspecified eye: Secondary | ICD-10-CM

## 2012-08-03 DIAGNOSIS — I10 Essential (primary) hypertension: Secondary | ICD-10-CM

## 2012-08-03 DIAGNOSIS — H3530 Unspecified macular degeneration: Secondary | ICD-10-CM

## 2012-09-28 ENCOUNTER — Encounter (INDEPENDENT_AMBULATORY_CARE_PROVIDER_SITE_OTHER): Payer: Medicare Other | Admitting: Ophthalmology

## 2012-09-28 DIAGNOSIS — H35059 Retinal neovascularization, unspecified, unspecified eye: Secondary | ICD-10-CM

## 2012-09-28 DIAGNOSIS — H43819 Vitreous degeneration, unspecified eye: Secondary | ICD-10-CM

## 2012-09-28 DIAGNOSIS — I10 Essential (primary) hypertension: Secondary | ICD-10-CM

## 2012-09-28 DIAGNOSIS — H3530 Unspecified macular degeneration: Secondary | ICD-10-CM

## 2012-09-28 DIAGNOSIS — H35039 Hypertensive retinopathy, unspecified eye: Secondary | ICD-10-CM

## 2012-10-06 ENCOUNTER — Ambulatory Visit (INDEPENDENT_AMBULATORY_CARE_PROVIDER_SITE_OTHER): Payer: Medicare Other | Admitting: Ophthalmology

## 2012-10-06 DIAGNOSIS — H35059 Retinal neovascularization, unspecified, unspecified eye: Secondary | ICD-10-CM

## 2012-10-06 DIAGNOSIS — I10 Essential (primary) hypertension: Secondary | ICD-10-CM

## 2012-10-06 DIAGNOSIS — H35039 Hypertensive retinopathy, unspecified eye: Secondary | ICD-10-CM

## 2012-10-06 DIAGNOSIS — H3530 Unspecified macular degeneration: Secondary | ICD-10-CM

## 2012-11-17 ENCOUNTER — Encounter (INDEPENDENT_AMBULATORY_CARE_PROVIDER_SITE_OTHER): Payer: Medicare Other | Admitting: Ophthalmology

## 2012-11-17 DIAGNOSIS — H35059 Retinal neovascularization, unspecified, unspecified eye: Secondary | ICD-10-CM

## 2012-12-22 ENCOUNTER — Ambulatory Visit (INDEPENDENT_AMBULATORY_CARE_PROVIDER_SITE_OTHER): Payer: Medicare Other | Admitting: Urology

## 2012-12-22 DIAGNOSIS — C61 Malignant neoplasm of prostate: Secondary | ICD-10-CM

## 2012-12-22 DIAGNOSIS — R350 Frequency of micturition: Secondary | ICD-10-CM

## 2012-12-31 ENCOUNTER — Encounter (INDEPENDENT_AMBULATORY_CARE_PROVIDER_SITE_OTHER): Payer: Medicare Other | Admitting: Ophthalmology

## 2012-12-31 DIAGNOSIS — I10 Essential (primary) hypertension: Secondary | ICD-10-CM

## 2012-12-31 DIAGNOSIS — H35039 Hypertensive retinopathy, unspecified eye: Secondary | ICD-10-CM

## 2012-12-31 DIAGNOSIS — H3530 Unspecified macular degeneration: Secondary | ICD-10-CM

## 2012-12-31 DIAGNOSIS — H43819 Vitreous degeneration, unspecified eye: Secondary | ICD-10-CM

## 2013-02-25 ENCOUNTER — Encounter (INDEPENDENT_AMBULATORY_CARE_PROVIDER_SITE_OTHER): Payer: Medicare Other | Admitting: Ophthalmology

## 2013-02-25 DIAGNOSIS — I10 Essential (primary) hypertension: Secondary | ICD-10-CM

## 2013-02-25 DIAGNOSIS — H43819 Vitreous degeneration, unspecified eye: Secondary | ICD-10-CM

## 2013-02-25 DIAGNOSIS — H3530 Unspecified macular degeneration: Secondary | ICD-10-CM

## 2013-02-25 DIAGNOSIS — H35039 Hypertensive retinopathy, unspecified eye: Secondary | ICD-10-CM

## 2013-04-22 ENCOUNTER — Encounter (INDEPENDENT_AMBULATORY_CARE_PROVIDER_SITE_OTHER): Payer: Medicare Other | Admitting: Ophthalmology

## 2013-04-22 DIAGNOSIS — I10 Essential (primary) hypertension: Secondary | ICD-10-CM

## 2013-04-22 DIAGNOSIS — H3530 Unspecified macular degeneration: Secondary | ICD-10-CM

## 2013-04-22 DIAGNOSIS — H35039 Hypertensive retinopathy, unspecified eye: Secondary | ICD-10-CM

## 2013-04-22 DIAGNOSIS — H43819 Vitreous degeneration, unspecified eye: Secondary | ICD-10-CM

## 2013-04-22 DIAGNOSIS — H353290 Exudative age-related macular degeneration, unspecified eye, stage unspecified: Secondary | ICD-10-CM

## 2013-06-03 ENCOUNTER — Encounter (INDEPENDENT_AMBULATORY_CARE_PROVIDER_SITE_OTHER): Payer: Medicare Other | Admitting: Ophthalmology

## 2013-06-03 DIAGNOSIS — H3530 Unspecified macular degeneration: Secondary | ICD-10-CM

## 2013-06-03 DIAGNOSIS — H43819 Vitreous degeneration, unspecified eye: Secondary | ICD-10-CM

## 2013-06-03 DIAGNOSIS — I10 Essential (primary) hypertension: Secondary | ICD-10-CM

## 2013-06-03 DIAGNOSIS — H353290 Exudative age-related macular degeneration, unspecified eye, stage unspecified: Secondary | ICD-10-CM

## 2013-06-03 DIAGNOSIS — H35039 Hypertensive retinopathy, unspecified eye: Secondary | ICD-10-CM

## 2013-06-22 ENCOUNTER — Ambulatory Visit (INDEPENDENT_AMBULATORY_CARE_PROVIDER_SITE_OTHER): Payer: Medicare Other | Admitting: Urology

## 2013-06-22 DIAGNOSIS — C61 Malignant neoplasm of prostate: Secondary | ICD-10-CM

## 2013-07-08 ENCOUNTER — Encounter (INDEPENDENT_AMBULATORY_CARE_PROVIDER_SITE_OTHER): Payer: Medicare Other | Admitting: Ophthalmology

## 2013-07-08 DIAGNOSIS — H43819 Vitreous degeneration, unspecified eye: Secondary | ICD-10-CM

## 2013-07-08 DIAGNOSIS — H353290 Exudative age-related macular degeneration, unspecified eye, stage unspecified: Secondary | ICD-10-CM

## 2013-07-08 DIAGNOSIS — H35039 Hypertensive retinopathy, unspecified eye: Secondary | ICD-10-CM

## 2013-07-08 DIAGNOSIS — H3530 Unspecified macular degeneration: Secondary | ICD-10-CM

## 2013-07-08 DIAGNOSIS — I10 Essential (primary) hypertension: Secondary | ICD-10-CM

## 2013-08-26 ENCOUNTER — Encounter (INDEPENDENT_AMBULATORY_CARE_PROVIDER_SITE_OTHER): Payer: Medicare Other | Admitting: Ophthalmology

## 2013-08-26 DIAGNOSIS — I10 Essential (primary) hypertension: Secondary | ICD-10-CM

## 2013-08-26 DIAGNOSIS — H35039 Hypertensive retinopathy, unspecified eye: Secondary | ICD-10-CM

## 2013-08-26 DIAGNOSIS — H3530 Unspecified macular degeneration: Secondary | ICD-10-CM

## 2013-08-26 DIAGNOSIS — H353290 Exudative age-related macular degeneration, unspecified eye, stage unspecified: Secondary | ICD-10-CM

## 2013-08-26 DIAGNOSIS — H43819 Vitreous degeneration, unspecified eye: Secondary | ICD-10-CM

## 2013-10-14 ENCOUNTER — Encounter (INDEPENDENT_AMBULATORY_CARE_PROVIDER_SITE_OTHER): Payer: Medicare Other | Admitting: Ophthalmology

## 2013-10-14 DIAGNOSIS — H3530 Unspecified macular degeneration: Secondary | ICD-10-CM

## 2013-10-14 DIAGNOSIS — H353290 Exudative age-related macular degeneration, unspecified eye, stage unspecified: Secondary | ICD-10-CM

## 2013-10-14 DIAGNOSIS — I10 Essential (primary) hypertension: Secondary | ICD-10-CM

## 2013-10-14 DIAGNOSIS — H35039 Hypertensive retinopathy, unspecified eye: Secondary | ICD-10-CM

## 2013-10-14 DIAGNOSIS — H43819 Vitreous degeneration, unspecified eye: Secondary | ICD-10-CM

## 2013-12-16 ENCOUNTER — Encounter (INDEPENDENT_AMBULATORY_CARE_PROVIDER_SITE_OTHER): Payer: Medicare Other | Admitting: Ophthalmology

## 2013-12-16 DIAGNOSIS — I10 Essential (primary) hypertension: Secondary | ICD-10-CM

## 2013-12-16 DIAGNOSIS — H35039 Hypertensive retinopathy, unspecified eye: Secondary | ICD-10-CM

## 2013-12-16 DIAGNOSIS — H43819 Vitreous degeneration, unspecified eye: Secondary | ICD-10-CM

## 2013-12-16 DIAGNOSIS — H3530 Unspecified macular degeneration: Secondary | ICD-10-CM

## 2013-12-16 DIAGNOSIS — H353290 Exudative age-related macular degeneration, unspecified eye, stage unspecified: Secondary | ICD-10-CM

## 2013-12-21 ENCOUNTER — Ambulatory Visit (INDEPENDENT_AMBULATORY_CARE_PROVIDER_SITE_OTHER): Payer: Medicare Other | Admitting: Urology

## 2013-12-21 DIAGNOSIS — C61 Malignant neoplasm of prostate: Secondary | ICD-10-CM

## 2014-02-24 ENCOUNTER — Encounter (INDEPENDENT_AMBULATORY_CARE_PROVIDER_SITE_OTHER): Payer: Medicare Other | Admitting: Ophthalmology

## 2014-02-24 DIAGNOSIS — I10 Essential (primary) hypertension: Secondary | ICD-10-CM

## 2014-02-24 DIAGNOSIS — H43813 Vitreous degeneration, bilateral: Secondary | ICD-10-CM

## 2014-02-24 DIAGNOSIS — H3532 Exudative age-related macular degeneration: Secondary | ICD-10-CM

## 2014-02-24 DIAGNOSIS — H3531 Nonexudative age-related macular degeneration: Secondary | ICD-10-CM

## 2014-02-24 DIAGNOSIS — H35033 Hypertensive retinopathy, bilateral: Secondary | ICD-10-CM

## 2014-05-12 ENCOUNTER — Encounter (INDEPENDENT_AMBULATORY_CARE_PROVIDER_SITE_OTHER): Payer: Medicare HMO | Admitting: Ophthalmology

## 2014-05-12 DIAGNOSIS — H35033 Hypertensive retinopathy, bilateral: Secondary | ICD-10-CM

## 2014-05-12 DIAGNOSIS — H43813 Vitreous degeneration, bilateral: Secondary | ICD-10-CM

## 2014-05-12 DIAGNOSIS — I10 Essential (primary) hypertension: Secondary | ICD-10-CM

## 2014-05-12 DIAGNOSIS — H3531 Nonexudative age-related macular degeneration: Secondary | ICD-10-CM

## 2014-06-13 ENCOUNTER — Encounter (INDEPENDENT_AMBULATORY_CARE_PROVIDER_SITE_OTHER): Payer: Medicare HMO | Admitting: Ophthalmology

## 2014-06-13 DIAGNOSIS — H3531 Nonexudative age-related macular degeneration: Secondary | ICD-10-CM | POA: Diagnosis not present

## 2014-06-13 DIAGNOSIS — H3532 Exudative age-related macular degeneration: Secondary | ICD-10-CM | POA: Diagnosis not present

## 2014-06-13 DIAGNOSIS — H35033 Hypertensive retinopathy, bilateral: Secondary | ICD-10-CM | POA: Diagnosis not present

## 2014-06-13 DIAGNOSIS — I10 Essential (primary) hypertension: Secondary | ICD-10-CM | POA: Diagnosis not present

## 2014-06-13 DIAGNOSIS — H43813 Vitreous degeneration, bilateral: Secondary | ICD-10-CM

## 2014-07-04 ENCOUNTER — Encounter (INDEPENDENT_AMBULATORY_CARE_PROVIDER_SITE_OTHER): Payer: Medicare HMO | Admitting: Ophthalmology

## 2014-07-04 DIAGNOSIS — H35033 Hypertensive retinopathy, bilateral: Secondary | ICD-10-CM | POA: Diagnosis not present

## 2014-07-04 DIAGNOSIS — I10 Essential (primary) hypertension: Secondary | ICD-10-CM | POA: Diagnosis not present

## 2014-07-04 DIAGNOSIS — H43813 Vitreous degeneration, bilateral: Secondary | ICD-10-CM

## 2014-07-04 DIAGNOSIS — H3532 Exudative age-related macular degeneration: Secondary | ICD-10-CM | POA: Diagnosis not present

## 2014-07-04 DIAGNOSIS — H3531 Nonexudative age-related macular degeneration: Secondary | ICD-10-CM

## 2014-07-21 ENCOUNTER — Encounter (INDEPENDENT_AMBULATORY_CARE_PROVIDER_SITE_OTHER): Payer: Medicare HMO | Admitting: Ophthalmology

## 2014-08-01 ENCOUNTER — Encounter (INDEPENDENT_AMBULATORY_CARE_PROVIDER_SITE_OTHER): Payer: Medicare HMO | Admitting: Ophthalmology

## 2014-08-01 DIAGNOSIS — H43813 Vitreous degeneration, bilateral: Secondary | ICD-10-CM

## 2014-08-01 DIAGNOSIS — H3531 Nonexudative age-related macular degeneration: Secondary | ICD-10-CM | POA: Diagnosis not present

## 2014-08-01 DIAGNOSIS — H3532 Exudative age-related macular degeneration: Secondary | ICD-10-CM

## 2014-08-01 DIAGNOSIS — H35033 Hypertensive retinopathy, bilateral: Secondary | ICD-10-CM

## 2014-08-01 DIAGNOSIS — I10 Essential (primary) hypertension: Secondary | ICD-10-CM | POA: Diagnosis not present

## 2014-08-29 ENCOUNTER — Encounter (INDEPENDENT_AMBULATORY_CARE_PROVIDER_SITE_OTHER): Payer: Medicare HMO | Admitting: Ophthalmology

## 2014-08-29 DIAGNOSIS — H3531 Nonexudative age-related macular degeneration: Secondary | ICD-10-CM

## 2014-08-29 DIAGNOSIS — I10 Essential (primary) hypertension: Secondary | ICD-10-CM | POA: Diagnosis not present

## 2014-08-29 DIAGNOSIS — H3532 Exudative age-related macular degeneration: Secondary | ICD-10-CM

## 2014-08-29 DIAGNOSIS — H43813 Vitreous degeneration, bilateral: Secondary | ICD-10-CM | POA: Diagnosis not present

## 2014-08-29 DIAGNOSIS — H35033 Hypertensive retinopathy, bilateral: Secondary | ICD-10-CM

## 2014-09-20 ENCOUNTER — Ambulatory Visit (INDEPENDENT_AMBULATORY_CARE_PROVIDER_SITE_OTHER): Payer: Medicare HMO | Admitting: Urology

## 2014-09-20 DIAGNOSIS — C61 Malignant neoplasm of prostate: Secondary | ICD-10-CM | POA: Diagnosis not present

## 2014-09-23 ENCOUNTER — Encounter (INDEPENDENT_AMBULATORY_CARE_PROVIDER_SITE_OTHER): Payer: Medicare HMO | Admitting: Ophthalmology

## 2014-09-23 DIAGNOSIS — H3531 Nonexudative age-related macular degeneration: Secondary | ICD-10-CM | POA: Diagnosis not present

## 2014-09-23 DIAGNOSIS — I10 Essential (primary) hypertension: Secondary | ICD-10-CM | POA: Diagnosis not present

## 2014-09-23 DIAGNOSIS — H3532 Exudative age-related macular degeneration: Secondary | ICD-10-CM | POA: Diagnosis not present

## 2014-09-23 DIAGNOSIS — H43813 Vitreous degeneration, bilateral: Secondary | ICD-10-CM

## 2014-09-23 DIAGNOSIS — H35033 Hypertensive retinopathy, bilateral: Secondary | ICD-10-CM | POA: Diagnosis not present

## 2014-10-21 ENCOUNTER — Encounter (INDEPENDENT_AMBULATORY_CARE_PROVIDER_SITE_OTHER): Payer: Medicare HMO | Admitting: Ophthalmology

## 2014-10-21 DIAGNOSIS — I10 Essential (primary) hypertension: Secondary | ICD-10-CM

## 2014-10-21 DIAGNOSIS — H43813 Vitreous degeneration, bilateral: Secondary | ICD-10-CM

## 2014-10-21 DIAGNOSIS — H35033 Hypertensive retinopathy, bilateral: Secondary | ICD-10-CM

## 2014-10-21 DIAGNOSIS — H3532 Exudative age-related macular degeneration: Secondary | ICD-10-CM

## 2014-10-21 DIAGNOSIS — H3531 Nonexudative age-related macular degeneration: Secondary | ICD-10-CM | POA: Diagnosis not present

## 2014-12-02 ENCOUNTER — Encounter (INDEPENDENT_AMBULATORY_CARE_PROVIDER_SITE_OTHER): Payer: Medicare HMO | Admitting: Ophthalmology

## 2014-12-02 DIAGNOSIS — I10 Essential (primary) hypertension: Secondary | ICD-10-CM | POA: Diagnosis not present

## 2014-12-02 DIAGNOSIS — H3532 Exudative age-related macular degeneration: Secondary | ICD-10-CM | POA: Diagnosis not present

## 2014-12-02 DIAGNOSIS — H3531 Nonexudative age-related macular degeneration: Secondary | ICD-10-CM | POA: Diagnosis not present

## 2014-12-02 DIAGNOSIS — H43813 Vitreous degeneration, bilateral: Secondary | ICD-10-CM

## 2014-12-02 DIAGNOSIS — H35033 Hypertensive retinopathy, bilateral: Secondary | ICD-10-CM

## 2015-01-27 ENCOUNTER — Encounter (INDEPENDENT_AMBULATORY_CARE_PROVIDER_SITE_OTHER): Payer: Medicare HMO | Admitting: Ophthalmology

## 2015-01-27 DIAGNOSIS — I10 Essential (primary) hypertension: Secondary | ICD-10-CM

## 2015-01-27 DIAGNOSIS — H43813 Vitreous degeneration, bilateral: Secondary | ICD-10-CM | POA: Diagnosis not present

## 2015-01-27 DIAGNOSIS — H353231 Exudative age-related macular degeneration, bilateral, with active choroidal neovascularization: Secondary | ICD-10-CM | POA: Diagnosis not present

## 2015-01-27 DIAGNOSIS — H35033 Hypertensive retinopathy, bilateral: Secondary | ICD-10-CM | POA: Diagnosis not present

## 2015-04-07 ENCOUNTER — Encounter (INDEPENDENT_AMBULATORY_CARE_PROVIDER_SITE_OTHER): Payer: Medicare HMO | Admitting: Ophthalmology

## 2015-04-07 DIAGNOSIS — I10 Essential (primary) hypertension: Secondary | ICD-10-CM

## 2015-04-07 DIAGNOSIS — H353231 Exudative age-related macular degeneration, bilateral, with active choroidal neovascularization: Secondary | ICD-10-CM | POA: Diagnosis not present

## 2015-04-07 DIAGNOSIS — H35033 Hypertensive retinopathy, bilateral: Secondary | ICD-10-CM

## 2015-04-07 DIAGNOSIS — H43813 Vitreous degeneration, bilateral: Secondary | ICD-10-CM | POA: Diagnosis not present

## 2015-06-09 ENCOUNTER — Encounter (INDEPENDENT_AMBULATORY_CARE_PROVIDER_SITE_OTHER): Payer: Medicare HMO | Admitting: Ophthalmology

## 2015-06-09 DIAGNOSIS — H353231 Exudative age-related macular degeneration, bilateral, with active choroidal neovascularization: Secondary | ICD-10-CM

## 2015-06-09 DIAGNOSIS — H35033 Hypertensive retinopathy, bilateral: Secondary | ICD-10-CM | POA: Diagnosis not present

## 2015-06-09 DIAGNOSIS — H43813 Vitreous degeneration, bilateral: Secondary | ICD-10-CM

## 2015-06-09 DIAGNOSIS — I10 Essential (primary) hypertension: Secondary | ICD-10-CM | POA: Diagnosis not present

## 2015-07-20 ENCOUNTER — Encounter (INDEPENDENT_AMBULATORY_CARE_PROVIDER_SITE_OTHER): Payer: Medicare HMO | Admitting: Ophthalmology

## 2015-07-20 DIAGNOSIS — H43813 Vitreous degeneration, bilateral: Secondary | ICD-10-CM | POA: Diagnosis not present

## 2015-07-20 DIAGNOSIS — H35033 Hypertensive retinopathy, bilateral: Secondary | ICD-10-CM | POA: Diagnosis not present

## 2015-07-20 DIAGNOSIS — H353231 Exudative age-related macular degeneration, bilateral, with active choroidal neovascularization: Secondary | ICD-10-CM

## 2015-07-20 DIAGNOSIS — I10 Essential (primary) hypertension: Secondary | ICD-10-CM

## 2015-08-11 ENCOUNTER — Encounter (INDEPENDENT_AMBULATORY_CARE_PROVIDER_SITE_OTHER): Payer: Medicare HMO | Admitting: Ophthalmology

## 2015-08-11 DIAGNOSIS — H43813 Vitreous degeneration, bilateral: Secondary | ICD-10-CM

## 2015-08-11 DIAGNOSIS — I10 Essential (primary) hypertension: Secondary | ICD-10-CM | POA: Diagnosis not present

## 2015-08-11 DIAGNOSIS — H353231 Exudative age-related macular degeneration, bilateral, with active choroidal neovascularization: Secondary | ICD-10-CM

## 2015-08-11 DIAGNOSIS — H35033 Hypertensive retinopathy, bilateral: Secondary | ICD-10-CM

## 2015-09-08 ENCOUNTER — Encounter (INDEPENDENT_AMBULATORY_CARE_PROVIDER_SITE_OTHER): Payer: Medicare HMO | Admitting: Ophthalmology

## 2015-09-08 DIAGNOSIS — I10 Essential (primary) hypertension: Secondary | ICD-10-CM

## 2015-09-08 DIAGNOSIS — H43813 Vitreous degeneration, bilateral: Secondary | ICD-10-CM | POA: Diagnosis not present

## 2015-09-08 DIAGNOSIS — H35033 Hypertensive retinopathy, bilateral: Secondary | ICD-10-CM | POA: Diagnosis not present

## 2015-09-08 DIAGNOSIS — H353231 Exudative age-related macular degeneration, bilateral, with active choroidal neovascularization: Secondary | ICD-10-CM

## 2015-09-17 ENCOUNTER — Encounter (HOSPITAL_COMMUNITY): Payer: Self-pay | Admitting: *Deleted

## 2015-09-17 ENCOUNTER — Other Ambulatory Visit: Payer: Self-pay

## 2015-09-17 ENCOUNTER — Emergency Department (HOSPITAL_COMMUNITY): Payer: Medicare HMO

## 2015-09-17 ENCOUNTER — Inpatient Hospital Stay (HOSPITAL_COMMUNITY)
Admission: EM | Admit: 2015-09-17 | Discharge: 2015-09-30 | DRG: 233 | Disposition: A | Discharge status: Home Health | Payer: Medicare HMO | Attending: Cardiothoracic Surgery | Admitting: Cardiothoracic Surgery

## 2015-09-17 DIAGNOSIS — K449 Diaphragmatic hernia without obstruction or gangrene: Secondary | ICD-10-CM | POA: Diagnosis present

## 2015-09-17 DIAGNOSIS — Z8546 Personal history of malignant neoplasm of prostate: Secondary | ICD-10-CM

## 2015-09-17 DIAGNOSIS — Z9841 Cataract extraction status, right eye: Secondary | ICD-10-CM

## 2015-09-17 DIAGNOSIS — K219 Gastro-esophageal reflux disease without esophagitis: Secondary | ICD-10-CM | POA: Diagnosis present

## 2015-09-17 DIAGNOSIS — I480 Paroxysmal atrial fibrillation: Secondary | ICD-10-CM | POA: Diagnosis present

## 2015-09-17 DIAGNOSIS — I213 ST elevation (STEMI) myocardial infarction of unspecified site: Secondary | ICD-10-CM

## 2015-09-17 DIAGNOSIS — I440 Atrioventricular block, first degree: Secondary | ICD-10-CM | POA: Diagnosis present

## 2015-09-17 DIAGNOSIS — K30 Functional dyspepsia: Secondary | ICD-10-CM | POA: Diagnosis not present

## 2015-09-17 DIAGNOSIS — I4892 Unspecified atrial flutter: Secondary | ICD-10-CM | POA: Diagnosis not present

## 2015-09-17 DIAGNOSIS — I2510 Atherosclerotic heart disease of native coronary artery without angina pectoris: Secondary | ICD-10-CM

## 2015-09-17 DIAGNOSIS — J939 Pneumothorax, unspecified: Secondary | ICD-10-CM

## 2015-09-17 DIAGNOSIS — I200 Unstable angina: Secondary | ICD-10-CM

## 2015-09-17 DIAGNOSIS — I110 Hypertensive heart disease with heart failure: Secondary | ICD-10-CM | POA: Diagnosis present

## 2015-09-17 DIAGNOSIS — D62 Acute posthemorrhagic anemia: Secondary | ICD-10-CM | POA: Diagnosis not present

## 2015-09-17 DIAGNOSIS — Z8521 Personal history of malignant neoplasm of larynx: Secondary | ICD-10-CM

## 2015-09-17 DIAGNOSIS — I237 Postinfarction angina: Secondary | ICD-10-CM | POA: Insufficient documentation

## 2015-09-17 DIAGNOSIS — J449 Chronic obstructive pulmonary disease, unspecified: Secondary | ICD-10-CM | POA: Diagnosis present

## 2015-09-17 DIAGNOSIS — R079 Chest pain, unspecified: Secondary | ICD-10-CM

## 2015-09-17 DIAGNOSIS — Z951 Presence of aortocoronary bypass graft: Secondary | ICD-10-CM

## 2015-09-17 DIAGNOSIS — I252 Old myocardial infarction: Secondary | ICD-10-CM

## 2015-09-17 DIAGNOSIS — Z9842 Cataract extraction status, left eye: Secondary | ICD-10-CM

## 2015-09-17 DIAGNOSIS — I214 Non-ST elevation (NSTEMI) myocardial infarction: Secondary | ICD-10-CM | POA: Diagnosis not present

## 2015-09-17 DIAGNOSIS — E785 Hyperlipidemia, unspecified: Secondary | ICD-10-CM | POA: Diagnosis present

## 2015-09-17 DIAGNOSIS — E039 Hypothyroidism, unspecified: Secondary | ICD-10-CM | POA: Diagnosis present

## 2015-09-17 DIAGNOSIS — J9811 Atelectasis: Secondary | ICD-10-CM | POA: Diagnosis not present

## 2015-09-17 DIAGNOSIS — I10 Essential (primary) hypertension: Secondary | ICD-10-CM | POA: Diagnosis present

## 2015-09-17 DIAGNOSIS — Z961 Presence of intraocular lens: Secondary | ICD-10-CM | POA: Diagnosis present

## 2015-09-17 DIAGNOSIS — R0602 Shortness of breath: Secondary | ICD-10-CM

## 2015-09-17 DIAGNOSIS — Z91041 Radiographic dye allergy status: Secondary | ICD-10-CM

## 2015-09-17 DIAGNOSIS — Z923 Personal history of irradiation: Secondary | ICD-10-CM

## 2015-09-17 DIAGNOSIS — I255 Ischemic cardiomyopathy: Secondary | ICD-10-CM | POA: Diagnosis present

## 2015-09-17 DIAGNOSIS — Z87891 Personal history of nicotine dependence: Secondary | ICD-10-CM

## 2015-09-17 DIAGNOSIS — I5041 Acute combined systolic (congestive) and diastolic (congestive) heart failure: Secondary | ICD-10-CM | POA: Diagnosis not present

## 2015-09-17 DIAGNOSIS — I77810 Thoracic aortic ectasia: Secondary | ICD-10-CM | POA: Diagnosis present

## 2015-09-17 LAB — CBC
HEMATOCRIT: 43.1 % (ref 39.0–52.0)
HEMOGLOBIN: 14.9 g/dL (ref 13.0–17.0)
MCH: 30.3 pg (ref 26.0–34.0)
MCHC: 34.6 g/dL (ref 30.0–36.0)
MCV: 87.6 fL (ref 78.0–100.0)
Platelets: 309 10*3/uL (ref 150–400)
RBC: 4.92 MIL/uL (ref 4.22–5.81)
RDW: 13.1 % (ref 11.5–15.5)
WBC: 7.9 10*3/uL (ref 4.0–10.5)

## 2015-09-17 LAB — BASIC METABOLIC PANEL
ANION GAP: 11 (ref 5–15)
BUN: 14 mg/dL (ref 6–20)
CALCIUM: 9.4 mg/dL (ref 8.9–10.3)
CO2: 24 mmol/L (ref 22–32)
Chloride: 98 mmol/L — ABNORMAL LOW (ref 101–111)
Creatinine, Ser: 0.9 mg/dL (ref 0.61–1.24)
Glucose, Bld: 125 mg/dL — ABNORMAL HIGH (ref 65–99)
POTASSIUM: 2.9 mmol/L — AB (ref 3.5–5.1)
Sodium: 133 mmol/L — ABNORMAL LOW (ref 135–145)

## 2015-09-17 LAB — TROPONIN I: TROPONIN I: 0.96 ng/mL — AB (ref <0.031)

## 2015-09-17 MED ORDER — SODIUM CHLORIDE 0.9 % IV SOLN
INTRAVENOUS | Status: DC
  Administered 2015-09-17: 23:00:00 via INTRAVENOUS

## 2015-09-17 MED ORDER — NITROGLYCERIN 0.4 MG SL SUBL
0.4000 mg | SUBLINGUAL_TABLET | SUBLINGUAL | Status: DC | PRN
  Administered 2015-09-17 – 2015-09-18 (×3): 0.4 mg via SUBLINGUAL
  Filled 2015-09-17: qty 1

## 2015-09-17 NOTE — ED Notes (Signed)
Pt c/o chest pain that started x 30 mins ago while watching TV; pt describes the pain as an ache that is on the left side of the chest and radiates to both arms; pt states he had a same episode x 2 days ago that resolved on its own; pt states he took 4 of the 325mg  ASA pta

## 2015-09-17 NOTE — ED Notes (Signed)
CRITICAL VALUE ALERT  Critical value received: Troponin 0.96  Date of notification:  09/17/2015  Time of notification: 2346  Critical value read back:Yes.    Nurse who received alert:  Fabio Neighbors RN  MD notified (1st page):  Dr. Rogene Houston  Time of first page:  [phone]

## 2015-09-17 NOTE — ED Provider Notes (Addendum)
CSN: [id number]     Arrival date & time 09/17/15  2119 History  By signing my name below, I, Jasmyn B. Alexander, attest that this documentation has been prepared under the direction and in the presence of Fredia Sorrow, MD.  Electronically Signed: Tedra Coupe. Sheppard Coil, ED Scribe. 09/17/2015. 11:30 PM.   Chief Complaint  Patient presents with  . Chest Pain   The history is provided by the patient, the spouse and a relative. No language interpreter was used.   HPI Comments: [name] [name] is a 82 y.o. male who presents to the Emergency Department complaining of gradual onset, intermittent. 9/10, aching, radiating left-sided chest pain to both arms x 2 hrs PTA. Pt reports that current chest pain episode began while watching tv PTA. He states that he had similar episode of chest pain on 09/15/15 that lasted longer than 20 minutes but resolved on its own. Pt has no hx of MI. Pt took Tums and 4 tablets of 325mg  Aspirin with moderate relief. Pt states chest pain is currently a 3/10 in [facility]. He says movement of arms relieves pain. Per pt's daughter, she believes that pt is "under a lot of stress" due to his son being recently diagnosed with Stage 4 cancer, which she thinks can be related to his chest pain.  Denies nausea, vomiting, or SOB.  Past Medical History  Diagnosis Date  . Hypertension   . GERD (gastroesophageal reflux disease)   . Prostate cancer ([place]) STAGE T2a,      FOLLOWED BY DR Diona Fanti AND DR MANNING  . H/O hiatal hernia   . Coronary atherosclerosis PER CT SCAN DONE 05-09-2011     CALCIFIED PLAQUE  . History of cancer of larynx 1996--- S/P  REMOVAL 1/3 VOICE BOX  AND RADIATION TX    NO RECURRENCE---  RESIDUAL , PT SPEECH A WHISPER  . Weakness of voice PT CAN ONLY WHISPER---  SECONDARY TO VOCAL CORD CA  S/P REMOVAL 1/3  VOICE BOX  . Hypothyroidism   . Heart murmur MILD--  ASYMPTOMATIC  . First degree heart block   . DJD (degenerative joint disease)    Past Surgical History   Procedure Laterality Date  . Larynx surgery  x3  1996    BX'S AND 1/3 OF VOICE BOX REMOVED DUE TO CANCER--  NO RECURRENCE---  (RESIDUAL , WHISPERS)  . Cataract extraction w/ intraocular lens  implant, bilateral    . Cardiovascular stress test  5 YRS AGO    PT STATES NORMAL  . Radioactive seed implant  08/02/2011    Procedure: RADIOACTIVE SEED IMPLANT;  Surgeon: Franchot Gallo, MD;  Location: [facility];  Service: Urology;  Laterality: N/A;  C-ARM RAD TECH OK PER [facility] AT MAIN  . Cystoscopy  08/02/2011    Procedure: CYSTOSCOPY FLEXIBLE;  Surgeon: Franchot Gallo, MD;  Location: [facility];  Service: Urology;  Laterality: N/A;   History reviewed. No pertinent family history. Social History  Substance Use Topics  . Smoking status: Former Smoker -- 3.00 packs/day for 45 years    Types: Cigarettes    Quit date: 07/31/1990  . Smokeless tobacco: Never Used  . Alcohol Use: No    Review of Systems  Constitutional: Negative for fever and chills.  HENT: Positive for voice change (Voicebox removal). Negative for rhinorrhea and sore throat.   Eyes: Negative for visual disturbance.  Respiratory: Negative for cough and shortness of breath.   Cardiovascular: Positive for chest pain. Negative for leg swelling.  Gastrointestinal: Positive for abdominal pain. Negative for nausea, vomiting and diarrhea.  Genitourinary: Negative for dysuria.  Skin: Negative for rash.  Neurological: Negative for headaches.  Hematological: Does not bruise/bleed easily.  Psychiatric/Behavioral: Negative for confusion.    Allergies  Sulfa antibiotics and Contrast media  Home Medications   Prior to Admission medications   Medication Sig Start Date End Date Taking? Authorizing Provider  amLODipine-benazepril (LOTREL) 10-20 MG per capsule Take 1 capsule by mouth daily.    Yes Historical Provider, MD  aspirin 81 MG chewable tablet Chew 81 mg by mouth daily.    Yes Historical  Provider, MD  aspirin EC 325 MG tablet Take 325 mg by mouth daily.   Yes Historical Provider, MD  Bilberry, Vaccinium myrtillus, (BILBERRY PO) Take 2 tablets by mouth daily.    Yes Historical Provider, MD  cloNIDine (CATAPRES) 0.1 MG tablet Take 0.1 mg by mouth 2 (two) times daily.    Yes Historical Provider, MD  GARLIC PO Take 1 tablet by mouth daily.   Yes Historical Provider, MD  levothyroxine (SYNTHROID, LEVOTHROID) 50 MCG tablet Take 50 mcg by mouth every morning.   Yes Historical Provider, MD  Multiple Vitamins-Minerals (PRESERVISION AREDS 2) CAPS Take 2 capsules by mouth daily.    Yes Historical Provider, MD  omeprazole (PRILOSEC) 20 MG capsule Take 20 mg by mouth every morning.    Yes Historical Provider, MD  valsartan-hydrochlorothiazide (DIOVAN-HCT) 320-12.5 MG per tablet Take 1 tablet by mouth every morning.    Yes Historical Provider, MD   BP 102/80 mmHg  Pulse 109  Resp 22  Ht 5\' 10"  (1.778 m)  Wt 84.369 kg  BMI 26.69 kg/m2  SpO2 94% Physical Exam  Constitutional: He is oriented to person, place, and time. He appears well-developed and well-nourished. No distress.  HENT:  Head: Normocephalic and atraumatic.  Mouth/Throat: Oropharynx is clear and moist.  Eyes: Conjunctivae and EOM are normal. Pupils are equal, round, and reactive to light. No scleral icterus.  Cardiovascular: Normal rate, regular rhythm and normal heart sounds.   No murmur heard. Left radial pulse is 2+  Pulmonary/Chest: Effort normal and breath sounds normal. He exhibits no tenderness.  Abdominal: Bowel sounds are normal. He exhibits no distension. There is no tenderness.  Musculoskeletal: He exhibits no edema.  No swelling of the ankles.  Neurological: He is alert and oriented to person, place, and time. No cranial nerve deficit. He exhibits normal muscle tone. Coordination normal.  Skin: Skin is warm and dry.  Psychiatric: He has a normal mood and affect.  Nursing note and vitals reviewed.   ED  Course  Procedures (including critical care time) DIAGNOSTIC STUDIES: Oxygen Saturation is 94% on RA, adequate by my interpretation.    COORDINATION OF CARE: 11:05 PM-Discussed treatment plan which includes CXR, BMP, and CBC with pt at bedside and pt agreed to plan. Will order Nitroglycerin.  Labs Review Labs Reviewed  BASIC METABOLIC PANEL - Abnormal; Notable for the following:    Sodium 133 (*)    Potassium 2.9 (*)    Chloride 98 (*)    Glucose, Bld 125 (*)    All other components within normal limits  TROPONIN I - Abnormal; Notable for the following:    Troponin I 0.96 (*)    All other components within normal limits  CBC  PROTIME-INR  APTT  I-STAT TROPOININ, ED    Imaging Review Dg Chest 2 View  09/17/2015  CLINICAL DATA:  82 year old male with chest pain EXAM:  CHEST  2 VIEW COMPARISON:  Chest radiograph dated 07/04/2011 FINDINGS: Two views of the chest demonstrate emphysematous changes of the lungs with bibasilar atelectasis/scarring. There is no focal consolidation or pneumothorax. Trace right pleural effusion versus pleural thickening. There is mild eventration of the right hemidiaphragm. Stable cardiac silhouette. No acute osseous pathology. IMPRESSION: No active cardiopulmonary disease. Electronically Signed   By: Anner Crete M.D.   On: 09/17/2015 22:25   I have personally reviewed and evaluated these images and lab results as part of my medical decision-making.   EKG Interpretation   Date/Time:  Sunday September 17 2015 21:27:26 EDT Ventricular Rate:  93 PR Interval:    QRS Duration: 107 QT Interval:  453 QTC Calculation: 564 R Axis:   -24 Text Interpretation:  Atrial fibrillation Borderline left axis deviation  Repol abnrm, severe global ischemia (LM/MVD) Prolonged QT interval New  since previous tracing Confirmed by Katie Faraone  MD, Camdyn Laden [phone]) on  09/17/2015 10:41:04 PM        CRITICAL CARE Performed by: Fredia Sorrow Total critical care time: 60 may  say dating somebody like minutes Critical care time was exclusive of separately billable procedures and treating other patients. Critical care was necessary to treat or prevent imminent or life-threatening deterioration. Critical care was time spent personally by me on the following activities: development of treatment plan with patient and/or surrogate as well as nursing, discussions with consultants, evaluation of patient's response to treatment, examination of patient, obtaining history from patient or surrogate, ordering and performing treatments and interventions, ordering and review of laboratory studies, ordering and review of radiographic studies, pulse oximetry and re-evaluation of patient's condition.  MDM   Final diagnoses:  Non-STEMI (non-ST elevated myocardial infarction) ([place])  Unstable angina ([place])      Patient with elevation in troponin. EKG with ST segment depression sort of globally. But mostly laterally and inferiorly. EKG would be consistent with an unstable angina ischemic picture. But with elevated troponin. Technically non-STEMI. Patient's chest pain is currently 2 out of 3 after sublingual nitroglycerin. Patient already on heparin. Patient will be started on nitroglycerin drip and titrated. Discussed with cardiology at [facility] Dr. Alvester Chou. Patient will be accepted by [facility] Sickles to the ICU by cardiology.  Patient without significant medical problems. Patient status post laryngeal CA in 1996 with removal one third of the voice box. Which has been stable and non-recurrence since. He is more global is more global in that there is ST segment depression inferiorly anterior laterally. Just re-discussed with cardiology. There is some question of a posterior MI. A lower vertical head and activated as a code STEMI and patient will be transferred to the Cath Lab.  With 3 sublingual nitroglycerin pain is still 2 out of 10. Because the persistent pain and the question will posterior MI will  activated as a code STEMI and patient will go to the Cath Lab. Will have EMS transfer. If [facility] cannot be here soon.  I personally performed the services described in this documentation, which was scribed in my presence. The recorded information has been reviewed and considered.    Fredia Sorrow, MD 09/18/15 [id number]  Addendum:. Subsequent discussion with on-call cardiology concern now may be for posterior MI. Patient will be treated as a STEMI and transferred to the Cath Lab at [facility]. Cath Lab cardiologist is Dr. Alvester Chou.  Fredia Sorrow, MD 09/18/15 [phone]

## 2015-09-18 ENCOUNTER — Inpatient Hospital Stay (HOSPITAL_COMMUNITY): Payer: Medicare HMO

## 2015-09-18 ENCOUNTER — Encounter (HOSPITAL_COMMUNITY)
Admission: EM | Disposition: A | Discharge status: Home Health | Payer: Self-pay | Source: Home / Self Care | Attending: Cardiothoracic Surgery

## 2015-09-18 ENCOUNTER — Other Ambulatory Visit: Payer: Self-pay

## 2015-09-18 ENCOUNTER — Other Ambulatory Visit: Payer: Self-pay | Admitting: *Deleted

## 2015-09-18 ENCOUNTER — Ambulatory Visit (HOSPITAL_COMMUNITY): Admit: 2015-09-18 | Payer: Self-pay | Admitting: Cardiovascular Disease

## 2015-09-18 ENCOUNTER — Encounter (HOSPITAL_COMMUNITY): Payer: Self-pay | Admitting: Cardiovascular Disease

## 2015-09-18 DIAGNOSIS — J449 Chronic obstructive pulmonary disease, unspecified: Secondary | ICD-10-CM | POA: Diagnosis present

## 2015-09-18 DIAGNOSIS — E039 Hypothyroidism, unspecified: Secondary | ICD-10-CM | POA: Diagnosis present

## 2015-09-18 DIAGNOSIS — K219 Gastro-esophageal reflux disease without esophagitis: Secondary | ICD-10-CM | POA: Diagnosis present

## 2015-09-18 DIAGNOSIS — I2510 Atherosclerotic heart disease of native coronary artery without angina pectoris: Secondary | ICD-10-CM

## 2015-09-18 DIAGNOSIS — I255 Ischemic cardiomyopathy: Secondary | ICD-10-CM | POA: Diagnosis present

## 2015-09-18 DIAGNOSIS — I25110 Atherosclerotic heart disease of native coronary artery with unstable angina pectoris: Secondary | ICD-10-CM

## 2015-09-18 DIAGNOSIS — I214 Non-ST elevation (NSTEMI) myocardial infarction: Secondary | ICD-10-CM | POA: Diagnosis present

## 2015-09-18 DIAGNOSIS — Z923 Personal history of irradiation: Secondary | ICD-10-CM | POA: Diagnosis not present

## 2015-09-18 DIAGNOSIS — R079 Chest pain, unspecified: Secondary | ICD-10-CM | POA: Diagnosis present

## 2015-09-18 DIAGNOSIS — Z91041 Radiographic dye allergy status: Secondary | ICD-10-CM | POA: Diagnosis not present

## 2015-09-18 DIAGNOSIS — Z951 Presence of aortocoronary bypass graft: Secondary | ICD-10-CM | POA: Diagnosis not present

## 2015-09-18 DIAGNOSIS — E785 Hyperlipidemia, unspecified: Secondary | ICD-10-CM | POA: Diagnosis present

## 2015-09-18 DIAGNOSIS — I4892 Unspecified atrial flutter: Secondary | ICD-10-CM | POA: Diagnosis not present

## 2015-09-18 DIAGNOSIS — Z87891 Personal history of nicotine dependence: Secondary | ICD-10-CM | POA: Diagnosis not present

## 2015-09-18 DIAGNOSIS — I77810 Thoracic aortic ectasia: Secondary | ICD-10-CM | POA: Diagnosis not present

## 2015-09-18 DIAGNOSIS — Z8521 Personal history of malignant neoplasm of larynx: Secondary | ICD-10-CM | POA: Diagnosis not present

## 2015-09-18 DIAGNOSIS — I200 Unstable angina: Secondary | ICD-10-CM | POA: Diagnosis not present

## 2015-09-18 DIAGNOSIS — Z9842 Cataract extraction status, left eye: Secondary | ICD-10-CM | POA: Diagnosis not present

## 2015-09-18 DIAGNOSIS — I480 Paroxysmal atrial fibrillation: Secondary | ICD-10-CM | POA: Diagnosis present

## 2015-09-18 DIAGNOSIS — Z8546 Personal history of malignant neoplasm of prostate: Secondary | ICD-10-CM | POA: Diagnosis not present

## 2015-09-18 DIAGNOSIS — I440 Atrioventricular block, first degree: Secondary | ICD-10-CM | POA: Diagnosis present

## 2015-09-18 DIAGNOSIS — I213 ST elevation (STEMI) myocardial infarction of unspecified site: Secondary | ICD-10-CM | POA: Diagnosis present

## 2015-09-18 DIAGNOSIS — I5041 Acute combined systolic (congestive) and diastolic (congestive) heart failure: Secondary | ICD-10-CM | POA: Diagnosis not present

## 2015-09-18 DIAGNOSIS — Z9841 Cataract extraction status, right eye: Secondary | ICD-10-CM | POA: Diagnosis not present

## 2015-09-18 DIAGNOSIS — I110 Hypertensive heart disease with heart failure: Secondary | ICD-10-CM | POA: Diagnosis present

## 2015-09-18 DIAGNOSIS — I10 Essential (primary) hypertension: Secondary | ICD-10-CM

## 2015-09-18 DIAGNOSIS — Z961 Presence of intraocular lens: Secondary | ICD-10-CM | POA: Diagnosis present

## 2015-09-18 DIAGNOSIS — D62 Acute posthemorrhagic anemia: Secondary | ICD-10-CM | POA: Diagnosis not present

## 2015-09-18 DIAGNOSIS — I237 Postinfarction angina: Secondary | ICD-10-CM | POA: Diagnosis present

## 2015-09-18 DIAGNOSIS — J9811 Atelectasis: Secondary | ICD-10-CM | POA: Diagnosis not present

## 2015-09-18 HISTORY — PX: CARDIAC CATHETERIZATION: SHX172

## 2015-09-18 HISTORY — DX: Non-ST elevation (NSTEMI) myocardial infarction: I21.4

## 2015-09-18 LAB — COMPREHENSIVE METABOLIC PANEL
ALBUMIN: 3.4 g/dL — AB (ref 3.5–5.0)
ALT: 13 U/L — AB (ref 17–63)
ANION GAP: 8 (ref 5–15)
AST: 31 U/L (ref 15–41)
Alkaline Phosphatase: 40 U/L (ref 38–126)
BUN: 11 mg/dL (ref 6–20)
CALCIUM: 8.7 mg/dL — AB (ref 8.9–10.3)
CO2: 23 mmol/L (ref 22–32)
Chloride: 101 mmol/L (ref 101–111)
Creatinine, Ser: 0.97 mg/dL (ref 0.61–1.24)
GFR calc non Af Amer: >60 mL/min (ref >60)
Glucose, Bld: 119 mg/dL — ABNORMAL HIGH (ref 65–99)
Potassium: 4 mmol/L (ref 3.5–5.1)
SODIUM: 132 mmol/L — AB (ref 135–145)
TOTAL PROTEIN: 6.1 g/dL — AB (ref 6.5–8.1)
Total Bilirubin: 0.5 mg/dL (ref 0.3–1.2)

## 2015-09-18 LAB — CBC
HCT: 38 % — ABNORMAL LOW (ref 39.0–52.0)
Hemoglobin: 12.5 g/dL — ABNORMAL LOW (ref 13.0–17.0)
MCH: 28.7 pg (ref 26.0–34.0)
MCHC: 32.9 g/dL (ref 30.0–36.0)
MCV: 87.4 fL (ref 78.0–100.0)
PLATELETS: 249 10*3/uL (ref 150–400)
RBC: 4.35 MIL/uL (ref 4.22–5.81)
RDW: 13.1 % (ref 11.5–15.5)
WBC: 8.6 10*3/uL (ref 4.0–10.5)

## 2015-09-18 LAB — TROPONIN I
TROPONIN I: 1.17 ng/mL — AB (ref <0.031)
Troponin I: 2.59 ng/mL (ref <0.031)
Troponin I: 3.4 ng/mL (ref <0.031)

## 2015-09-18 LAB — MRSA PCR SCREENING: MRSA by PCR: NEGATIVE

## 2015-09-18 LAB — ECHOCARDIOGRAM COMPLETE
CHL CUP MV DEC (S): 176
CHL CUP STROKE VOLUME: 62 mL
CHL CUP TV REG PEAK VELOCITY: 236 cm/s
EWDT: 176 ms
FS: 23 % — AB (ref 28–44)
Height: 70 in
IV/PV OW: 1.57
LA diam index: 2 cm/m2
LA vol: 69.7 mL
LASIZE: 41 mm
LAVOLA4C: 84.8 mL
LAVOLIN: 33.9 mL/m2
LDCA: 3.46 cm2
LEFT ATRIUM END SYS DIAM: 41 mm
LV SIMPSON'S DISK: 48
LV dias vol: 128 mL (ref 62–150)
LV sys vol index: 32 mL/m2
LV sys vol: 66 mL — AB (ref 21–61)
LVDIAVOLIN: 62 mL/m2
LVOTD: 21 mm
MVPG: 7 mmHg
MVPKEVEL: 129 m/s
PW: 10.5 mm — AB (ref 0.6–1.1)
RV LATERAL S' VELOCITY: 10.6 cm/s
RV sys press: 30 mmHg
TAPSE: 22.6 mm
TR max vel: 236 cm/s
Weight: 2976 oz

## 2015-09-18 LAB — MAGNESIUM: MAGNESIUM: 1.7 mg/dL (ref 1.7–2.4)

## 2015-09-18 LAB — LIPID PANEL
CHOL/HDL RATIO: 5 ratio
CHOLESTEROL: 164 mg/dL (ref 0–200)
HDL: 33 mg/dL — AB (ref >40)
LDL Cholesterol: 116 mg/dL — ABNORMAL HIGH (ref 0–99)
TRIGLYCERIDES: 77 mg/dL (ref <150)
VLDL: 15 mg/dL (ref 0–40)

## 2015-09-18 LAB — TSH: TSH: 0.865 u[IU]/mL (ref 0.350–4.500)

## 2015-09-18 LAB — HEPARIN LEVEL (UNFRACTIONATED): HEPARIN UNFRACTIONATED: 0.14 [IU]/mL — AB (ref 0.30–0.70)

## 2015-09-18 LAB — PROTIME-INR
INR: 1.31 (ref 0.00–1.49)
PROTHROMBIN TIME: 16.4 s — AB (ref 11.6–15.2)

## 2015-09-18 LAB — T4, FREE: Free T4: 0.98 ng/dL (ref 0.61–1.12)

## 2015-09-18 LAB — BRAIN NATRIURETIC PEPTIDE: B NATRIURETIC PEPTIDE 5: 135.4 pg/mL — AB (ref 0.0–100.0)

## 2015-09-18 LAB — POCT ACTIVATED CLOTTING TIME: Activated Clotting Time: 125 seconds

## 2015-09-18 LAB — APTT: aPTT: 159 seconds — ABNORMAL HIGH (ref 24–37)

## 2015-09-18 SURGERY — LEFT HEART CATH AND CORONARY ANGIOGRAPHY
Anesthesia: LOCAL

## 2015-09-18 MED ORDER — CLONIDINE HCL 0.1 MG PO TABS
0.1000 mg | ORAL_TABLET | Freq: Two times a day (BID) | ORAL | Status: DC
  Administered 2015-09-18 – 2015-09-21 (×6): 0.1 mg via ORAL
  Filled 2015-09-18 (×8): qty 1

## 2015-09-18 MED ORDER — PROSIGHT PO TABS
2.0000 | ORAL_TABLET | Freq: Every day | ORAL | Status: DC
  Administered 2015-09-18 – 2015-09-30 (×12): 2 via ORAL
  Filled 2015-09-18 (×13): qty 2

## 2015-09-18 MED ORDER — DIPHENHYDRAMINE HCL 50 MG/ML IJ SOLN
INTRAMUSCULAR | Status: AC
  Filled 2015-09-18: qty 1

## 2015-09-18 MED ORDER — POTASSIUM CHLORIDE CRYS ER 20 MEQ PO TBCR
40.0000 meq | EXTENDED_RELEASE_TABLET | Freq: Once | ORAL | Status: AC
  Administered 2015-09-18: 40 meq via ORAL
  Filled 2015-09-18: qty 2

## 2015-09-18 MED ORDER — LEVOTHYROXINE SODIUM 50 MCG PO TABS
50.0000 ug | ORAL_TABLET | Freq: Every day | ORAL | Status: DC
  Administered 2015-09-18 – 2015-09-30 (×12): 50 ug via ORAL
  Filled 2015-09-18 (×12): qty 1

## 2015-09-18 MED ORDER — HEPARIN (PORCINE) IN NACL 2-0.9 UNIT/ML-% IJ SOLN
INTRAMUSCULAR | Status: AC
  Filled 2015-09-18: qty 500

## 2015-09-18 MED ORDER — MIDAZOLAM HCL 2 MG/2ML IJ SOLN
INTRAMUSCULAR | Status: AC
  Filled 2015-09-18: qty 2

## 2015-09-18 MED ORDER — PERFLUTREN LIPID MICROSPHERE
INTRAVENOUS | Status: AC
  Administered 2015-09-18: 2 mL
  Filled 2015-09-18: qty 10

## 2015-09-18 MED ORDER — LIDOCAINE HCL (PF) 1 % IJ SOLN
INTRAMUSCULAR | Status: AC
  Filled 2015-09-18: qty 30

## 2015-09-18 MED ORDER — METHYLPREDNISOLONE SODIUM SUCC 125 MG IJ SOLR
INTRAMUSCULAR | Status: DC | PRN
  Administered 2015-09-18: 125 mg via INTRAVENOUS

## 2015-09-18 MED ORDER — MIDAZOLAM HCL 2 MG/2ML IJ SOLN
INTRAMUSCULAR | Status: DC | PRN
  Administered 2015-09-18: 1 mg via INTRAVENOUS

## 2015-09-18 MED ORDER — IOPAMIDOL (ISOVUE-370) INJECTION 76%
INTRAVENOUS | Status: AC
  Filled 2015-09-18: qty 125

## 2015-09-18 MED ORDER — ATORVASTATIN CALCIUM 80 MG PO TABS
80.0000 mg | ORAL_TABLET | Freq: Every day | ORAL | Status: DC
  Administered 2015-09-18 – 2015-09-29 (×11): 80 mg via ORAL
  Filled 2015-09-18 (×11): qty 1

## 2015-09-18 MED ORDER — POTASSIUM CHLORIDE 10 MEQ/100ML IV SOLN
10.0000 meq | Freq: Once | INTRAVENOUS | Status: AC
  Administered 2015-09-18: 10 meq via INTRAVENOUS
  Filled 2015-09-18: qty 100

## 2015-09-18 MED ORDER — SODIUM CHLORIDE 0.9% FLUSH: 3.0000 mL | INTRAVENOUS | Status: DC | PRN

## 2015-09-18 MED ORDER — HEPARIN (PORCINE) IN NACL 100-0.45 UNIT/ML-% IJ SOLN
1100.0000 [IU]/h | INTRAMUSCULAR | Status: DC
  Administered 2015-09-18: 1100 [IU]/h via INTRAVENOUS
  Filled 2015-09-18: qty 250

## 2015-09-18 MED ORDER — SODIUM CHLORIDE 0.9 % IV SOLN: 250.0000 mL | INTRAVENOUS | Status: DC | PRN

## 2015-09-18 MED ORDER — ASPIRIN 81 MG PO CHEW
81.0000 mg | CHEWABLE_TABLET | Freq: Every day | ORAL | Status: DC
  Administered 2015-09-18 – 2015-09-21 (×4): 81 mg via ORAL
  Filled 2015-09-18 (×4): qty 1

## 2015-09-18 MED ORDER — NITROGLYCERIN 1 MG/10 ML FOR IR/CATH LAB
INTRA_ARTERIAL | Status: AC
  Filled 2015-09-18: qty 10

## 2015-09-18 MED ORDER — DIPHENHYDRAMINE HCL 50 MG/ML IJ SOLN
INTRAMUSCULAR | Status: DC | PRN
  Administered 2015-09-18: 25 mg via INTRAVENOUS

## 2015-09-18 MED ORDER — PANTOPRAZOLE SODIUM 40 MG PO TBEC
40.0000 mg | DELAYED_RELEASE_TABLET | Freq: Every day | ORAL | Status: DC
  Administered 2015-09-18 – 2015-09-20 (×3): 40 mg via ORAL
  Filled 2015-09-18 (×4): qty 1

## 2015-09-18 MED ORDER — METHYLPREDNISOLONE SODIUM SUCC 125 MG IJ SOLR
INTRAMUSCULAR | Status: AC
  Filled 2015-09-18: qty 2

## 2015-09-18 MED ORDER — ACETAMINOPHEN 325 MG PO TABS: 650.0000 mg | ORAL_TABLET | ORAL | Status: DC | PRN

## 2015-09-18 MED ORDER — MORPHINE SULFATE (PF) 2 MG/ML IV SOLN
2.0000 mg | INTRAVENOUS | Status: DC | PRN
  Administered 2015-09-19 – 2015-09-20 (×2): 2 mg via INTRAVENOUS
  Filled 2015-09-18 (×2): qty 1

## 2015-09-18 MED ORDER — NITROGLYCERIN IN D5W 200-5 MCG/ML-% IV SOLN
5.0000 ug/min | Freq: Once | INTRAVENOUS | Status: AC
  Administered 2015-09-18: 5 ug/min via INTRAVENOUS
  Filled 2015-09-18: qty 250

## 2015-09-18 MED ORDER — HEPARIN (PORCINE) IN NACL 2-0.9 UNIT/ML-% IJ SOLN
INTRAMUSCULAR | Status: DC | PRN
  Administered 2015-09-18: 1500 mL

## 2015-09-18 MED ORDER — NITROGLYCERIN IN D5W 200-5 MCG/ML-% IV SOLN
INTRAVENOUS | Status: DC | PRN
  Administered 2015-09-18: 10 ug/min via INTRAVENOUS

## 2015-09-18 MED ORDER — ASPIRIN EC 81 MG PO TBEC: 81.0000 mg | DELAYED_RELEASE_TABLET | Freq: Every day | ORAL | Status: DC

## 2015-09-18 MED ORDER — OXYCODONE-ACETAMINOPHEN 5-325 MG PO TABS
1.0000 | ORAL_TABLET | ORAL | Status: DC | PRN
  Administered 2015-09-19: 1 via ORAL
  Filled 2015-09-18: qty 1

## 2015-09-18 MED ORDER — FAMOTIDINE IN NACL 20-0.9 MG/50ML-% IV SOLN
INTRAVENOUS | Status: DC | PRN
  Administered 2015-09-18: 20 mg via INTRAVENOUS

## 2015-09-18 MED ORDER — SODIUM CHLORIDE 0.9% FLUSH
3.0000 mL | Freq: Two times a day (BID) | INTRAVENOUS | Status: DC
  Administered 2015-09-18 – 2015-09-21 (×4): 3 mL via INTRAVENOUS

## 2015-09-18 MED ORDER — HEPARIN (PORCINE) IN NACL 100-0.45 UNIT/ML-% IJ SOLN
1600.0000 [IU]/h | INTRAMUSCULAR | Status: DC
  Administered 2015-09-18: 1100 [IU]/h via INTRAVENOUS
  Administered 2015-09-19 – 2015-09-20 (×3): 1350 [IU]/h via INTRAVENOUS
  Administered 2015-09-21: 1600 [IU]/h via INTRAVENOUS
  Filled 2015-09-18 (×5): qty 250

## 2015-09-18 MED ORDER — METOPROLOL TARTRATE 12.5 MG HALF TABLET
12.5000 mg | ORAL_TABLET | Freq: Two times a day (BID) | ORAL | Status: DC
  Administered 2015-09-18 (×2): 12.5 mg via ORAL
  Filled 2015-09-18 (×2): qty 1

## 2015-09-18 MED ORDER — NITROGLYCERIN 1 MG/10 ML FOR IR/CATH LAB: INTRA_ARTERIAL | Status: DC | PRN

## 2015-09-18 MED ORDER — HEPARIN (PORCINE) IN NACL 2-0.9 UNIT/ML-% IJ SOLN
INTRAMUSCULAR | Status: AC
  Filled 2015-09-18: qty 1000

## 2015-09-18 MED ORDER — FAMOTIDINE IN NACL 20-0.9 MG/50ML-% IV SOLN
INTRAVENOUS | Status: AC
  Filled 2015-09-18: qty 50

## 2015-09-18 MED ORDER — SODIUM CHLORIDE 0.9 % IV SOLN
INTRAVENOUS | Status: DC
  Administered 2015-09-18 – 2015-09-20 (×2): via INTRAVENOUS

## 2015-09-18 MED ORDER — ONDANSETRON HCL 4 MG/2ML IJ SOLN
4.0000 mg | Freq: Four times a day (QID) | INTRAMUSCULAR | Status: DC | PRN
  Administered 2015-09-21: 4 mg via INTRAVENOUS
  Filled 2015-09-18: qty 2

## 2015-09-18 MED ORDER — HEPARIN SODIUM (PORCINE) 5000 UNIT/ML IJ SOLN
4000.0000 [IU] | Freq: Once | INTRAMUSCULAR | Status: AC
  Administered 2015-09-18: 4000 [IU] via INTRAVENOUS
  Filled 2015-09-18: qty 1

## 2015-09-18 MED ORDER — ONDANSETRON HCL 4 MG/2ML IJ SOLN
4.0000 mg | Freq: Once | INTRAMUSCULAR | Status: AC
  Administered 2015-09-18: 4 mg via INTRAVENOUS
  Filled 2015-09-18: qty 2

## 2015-09-18 MED ORDER — NITROGLYCERIN IN D5W 200-5 MCG/ML-% IV SOLN
0.0000 ug/min | INTRAVENOUS | Status: DC
Start: 2015-09-18 — End: 2015-09-22
  Administered 2015-09-18: 30 ug/min via INTRAVENOUS
  Administered 2015-09-19 – 2015-09-20 (×3): 40 ug/min via INTRAVENOUS
  Administered 2015-09-21: 45 ug/min via INTRAVENOUS
  Administered 2015-09-21: 50 ug/min via INTRAVENOUS
  Filled 2015-09-18 (×5): qty 250

## 2015-09-18 MED ORDER — ONDANSETRON HCL 4 MG/2ML IJ SOLN: 4.0000 mg | Freq: Four times a day (QID) | INTRAMUSCULAR | Status: DC | PRN

## 2015-09-18 MED ORDER — SODIUM CHLORIDE 0.9 % IV SOLN: INTRAVENOUS | Status: DC

## 2015-09-18 MED ORDER — LIDOCAINE HCL (PF) 1 % IJ SOLN
INTRAMUSCULAR | Status: DC | PRN
  Administered 2015-09-18: 30 mL

## 2015-09-18 MED ORDER — SPIRONOLACTONE 25 MG PO TABS
12.5000 mg | ORAL_TABLET | Freq: Every day | ORAL | Status: DC
  Administered 2015-09-18 – 2015-09-20 (×3): 12.5 mg via ORAL
  Filled 2015-09-18 (×4): qty 1

## 2015-09-18 SURGICAL SUPPLY — 10 items
CATH INFINITI 5FR JL5 (CATHETERS) ×1 IMPLANT
CATH INFINITI 5FR MULTPACK ANG (CATHETERS) ×1 IMPLANT
KIT ENCORE 26 ADVANTAGE (KITS) ×1 IMPLANT
KIT HEART LEFT (KITS) ×2 IMPLANT
PACK CARDIAC CATHETERIZATION (CUSTOM PROCEDURE TRAY) ×2 IMPLANT
SHEATH PINNACLE 6F 10CM (SHEATH) ×1 IMPLANT
SYR MEDRAD MARK V 150ML (SYRINGE) ×2 IMPLANT
TRANSDUCER W/STOPCOCK (MISCELLANEOUS) ×2 IMPLANT
TUBING CIL FLEX 10 FLL-RA (TUBING) ×2 IMPLANT
WIRE EMERALD 3MM-J .035X150CM (WIRE) ×1 IMPLANT

## 2015-09-18 NOTE — Progress Notes (Signed)
ANTICOAGULATION CONSULT NOTE - Initial Consult  Pharmacy Consult for Heparin  Indication: S/P Cath with multi-vessel disease  Allergies  Allergen Reactions  . Sulfa Antibiotics Palpitations  . Contrast Media [Iodinated Diagnostic Agents] Rash    ivp dye---  Pt states w/ pre-treatment does okay    Patient Measurements: Height: 5\' 10"  (177.8 cm) Weight: 186 lb (84.369 kg) IBW/kg (Calculated) : 73  Vital Signs: Temp: 98.7 F (37.1 C) (06/26 0300) Temp Source: Oral (06/26 0300) BP: 100/62 mmHg (06/26 0315) Pulse Rate: 68 (06/26 0315)  Labs:  Recent Labs  09/17/15 2135 09/18/15 0048  HGB 14.9  --   HCT 43.1  --   PLT 309  --   APTT  --  159*  LABPROT  --  16.4*  INR  --  1.31  CREATININE 0.90  --   TROPONINI 0.96*  --     Estimated Creatinine Clearance: 62 mL/min (by C-G formula based on Cr of 0.9).  Assessment: 82 y/o M s/p cath with multi-vessel disease to await surgery consult. To resume IV heparin 8 hours post sheath removal. Sheath removed at ~0240.   Goal of Therapy:  Heparin level 0.3-0.7 units/ml Monitor platelets by anticoagulation protocol: Yes   Plan:  -Start heparin at 1100 units/hr at 1030  -1830 HL -Daily CBC/HL -Monitor for bleeding -F/U TCTS plans  Narda Bonds 09/18/2015,3:24 AM

## 2015-09-18 NOTE — Progress Notes (Signed)
   09/18/15 0900  Clinical Encounter Type  Visited With Patient  Visit Type Follow-up  Referral From Chaplain  Consult/Referral To Chaplain  Spiritual Encounters  Spiritual Needs Emotional  CHP followed-up with patient per on-call CHP.  Visited with patient and offered ministry of presence and emotional support. CHP available as needed. Roe Coombs 09/18/2015

## 2015-09-18 NOTE — Progress Notes (Signed)
Day of Surgery Procedure(s) (LRB): Left Heart Cath and Coronary Angiography (N/A) Subjective: Patient examined, cardiac catheterization with coronary arteriograms and transthoracic echocardiogram personally reviewed and counseled with patient Active 82 year old Caucasian male admitted in transfer from outside hospital with chest pain and positive cardiac enzymes. Cardiac catheterization demonstrates severe multivessel coronary disease with suboptimal targets for grafting. Ejection fraction is 45-50 percent with mild AI, MR. There is dilatation of the ascending aorta 4.5 cm. Chest CT scan is pending to further evaluate the thoracic aorta. The patient is stable on IV heparin and nitroglycerin.  Patient has history of hypertension, laryngectomy for cancer in 1996, remote history of smoking, and history of prostate cancer.   Vital signs in last 24 hours: Temp:  [98.1 F (36.7 C)-98.7 F (37.1 C)] 98.1 F (36.7 C) (06/26 1636) Pulse Rate:  [0-109] 67 (06/26 1700) Cardiac Rhythm:  [-] Normal sinus rhythm;Heart block (06/26 0800) Resp:  [0-41] 20 (06/26 1700) BP: (98-173)/(61-101) 117/69 mmHg (06/26 1700) SpO2:  [0 %-100 %] 97 % (06/26 1700) Weight:  [186 lb (84.369 kg)] 186 lb (84.369 kg) (06/26 0012)  Hemodynamic parameters for last 24 hours:  sinus rhythm  Intake/Output from previous day: 06/25 0701 - 06/26 0700 In: 2064.3 [I.V.:2064.3] Out: 600 [Urine:600] Intake/Output this shift: Total I/O In: 1313.5 [P.O.:600; I.V.:713.5] Out: 1100 [Urine:1100]      Physical Exam  General: Elderly Caucasian male breathing comfortably supine in the CCU HEENT: Normocephalic pupils equal , dentition with total plates Neck: Supple without JVD, adenopathy, or bruit Chest: Clear to auscultation, symmetrical breath sounds, no rhonchi, no tenderness             or deformity Cardiovascular: Regular rate and rhythm, no murmur, no gallop, peripheral pulses             palpable in all  extremities Abdomen:  Soft, nontender, no palpable mass or organomegaly Extremities: Warm, well-perfused, no clubbing cyanosis edema or tenderness,              no venous stasis changes of the legs Rectal/GU: Deferred Neuro: Grossly non--focal and symmetrical throughout Skin: Clean and dry without rash or ulceration   Lab Results:  Recent Labs  09/17/15 2135 09/18/15 0339  WBC 7.9 8.6  HGB 14.9 12.5*  HCT 43.1 38.0*  PLT 309 249   BMET:  Recent Labs  09/17/15 2135 09/18/15 0339  NA 133* 132*  K 2.9* 4.0  CL 98* 101  CO2 24 23  GLUCOSE 125* 119*  BUN 14 11  CREATININE 0.90 0.97  CALCIUM 9.4 8.7*    PT/INR:  Recent Labs  09/18/15 0048  LABPROT 16.4*  INR 1.31   ABG No results found for: PHART, HCO3, TCO2, ACIDBASEDEF, O2SAT CBG (last 3)  No results for input(s): GLUCAP in the last 72 hours.  Assessment/Plan: S/P Procedure(s) (LRB): Left Heart Cath and Coronary Angiography (N/A)  Patient has severe multivessel coronary disease with suboptimal targets for grafting. If PCI is not possible then high risk CABG would be his best long-term therapeutic option. We will review his CT scan of chest to determine the severity of his thoracic aortic disease. Continue IV heparin and nitroglycerin pending PCI versus high risk CABG. Pre-CABG Dopplers pending.  LOS: 0 days    Tharon Aquas Trigt III 09/18/2015

## 2015-09-18 NOTE — Progress Notes (Signed)
CRITICAL VALUE ALERT  Critical value received:  Troponin 1.17   Date of notification:  09/18/15  Time of notification:  0400  Critical value read back:No.  Nurse who received alert:  Carolanne Grumbling, RN  MD notified (1st page):  Shelva Majestic  Time of first page:  [id number]  Elevated troponin expected. No new orders at this time. Notified per hospital protocol.

## 2015-09-18 NOTE — Progress Notes (Signed)
Subjective:  Feels better: Had chest pain earlier today, leading to increasing IV NTG.  Objective:   Vital Signs : Filed Vitals:   09/18/15 0530 09/18/15 0600 09/18/15 0700 09/18/15 0800  BP: 103/61 101/67 110/72 127/90  Pulse: 73 72 81 91  Temp:    98.1 F (36.7 C)  TempSrc:    Oral  Resp: 16 17 19 25   Height:      Weight:      SpO2: 94% 94% 95% 96%    Intake/Output from previous day:  Intake/Output Summary (Last 24 hours) at 09/18/15 1131 Last data filed at 09/18/15 1100  Gross per 24 hour  Intake 2557.8 ml  Output   1225 ml  Net 1332.8 ml    I/O since admission:  Wt Readings from Last 3 Encounters:  09/18/15 186 lb (84.369 kg)  08/22/11 189 lb (85.73 kg)  07/31/11 190 lb (86.183 kg)    Medications: . aspirin  81 mg Oral Daily  . atorvastatin  80 mg Oral q1800  . cloNIDine  0.1 mg Oral BID  . levothyroxine  50 mcg Oral QAC breakfast  . metoprolol tartrate  12.5 mg Oral BID  . multivitamin  2 tablet Oral Daily  . pantoprazole  40 mg Oral Daily  . sodium chloride flush  3 mL Intravenous Q12H    . sodium chloride 50 mL/hr at 09/18/15 0900  . heparin 1,100 Units/hr (09/18/15 1124)  . nitroGLYCERIN 35 mcg/min (09/18/15 0800)    Physical Exam:   General appearance: alert, cooperative and no distress Neck: no adenopathy, no carotid bruit, no JVD, supple, symmetrical, trachea midline and thyroid not enlarged, symmetric, no tenderness/mass/nodules Lungs: basilar rales/ decreased BS at bases Heart: regular rhtyhm HR 88-110; 1/6 systolic murmur, no S4 gallop.  No rubs thrills or heaves Abdomen: soft, non-tender; bowel sounds normal; no masses,  no organomegaly Extremities: no edema, redness or tenderness in the calves or thighs Pulses: 2+ and symmetric Skin: Skin color, texture, turgor normal. No rashes or lesions Neurologic: Grossly normal   Rate: 104  Rhythm: ST  ECG (independently read by me): Normal sinus rhythm at 74 bpm.  First-degree AV block.   Improvement in initial ST abnormalities from initial ECG of 625/17  09/17/15   ECG (independently read by me): Sinus rhythm at 93 bpm with PACs.  Diffuse global ST segment changes with 2 mm ST elevation in lead aVR  Lab Results:   Recent Labs  09/17/15 2135 09/18/15 0339  NA 133* 132*  K 2.9* 4.0  CL 98* 101  CO2 24 23  GLUCOSE 125* 119*  BUN 14 11  CREATININE 0.90 0.97  CALCIUM 9.4 8.7*  MG  --  1.7    Hepatic Function Latest Ref Rng 09/18/2015 07/26/2011  Total Protein 6.5 - 8.1 g/dL 6.1(L) 7.1  Albumin 3.5 - 5.0 g/dL 3.4(L) 3.9  AST 15 - 41 U/L 31 14  ALT 17 - 63 U/L 13(L) 11  Alk Phosphatase 38 - 126 U/L 40 40  Total Bilirubin 0.3 - 1.2 mg/dL 0.5 0.4     Recent Labs  09/17/15 2135 09/18/15 0339  WBC 7.9 8.6  HGB 14.9 12.5*  HCT 43.1 38.0*  MCV 87.6 87.4  PLT 309 249     Recent Labs  09/17/15 2135 09/18/15 0339  TROPONINI 0.96* 1.17*    Lab Results  Component Value Date   TSH 0.865 09/18/2015   No results for input(s): HGBA1C in the last 72 hours.   Recent  Labs  09/18/15 0339  PROT 6.1*  ALBUMIN 3.4*  AST 31  ALT 13*  ALKPHOS 40  BILITOT 0.5    Recent Labs  09/18/15 0048  INR 1.31   BNP (last 3 results)  Recent Labs  09/18/15 0339  BNP 135.4*    ProBNP (last 3 results) No results for input(s): PROBNP in the last 8760 hours.   Lipid Panel     Component Value Date/Time   CHOL 164 09/18/2015 0339   TRIG 77 09/18/2015 0339   HDL 33* 09/18/2015 0339   CHOLHDL 5.0 09/18/2015 0339   VLDL 15 09/18/2015 0339   LDLCALC 116* 09/18/2015 0339      Imaging:  Dg Chest 2 View  09/17/2015  CLINICAL DATA:  82 year old male with chest pain EXAM: CHEST  2 VIEW COMPARISON:  Chest radiograph dated 07/04/2011 FINDINGS: Two views of the chest demonstrate emphysematous changes of the lungs with bibasilar atelectasis/scarring. There is no focal consolidation or pneumothorax. Trace right pleural effusion versus pleural thickening. There is mild  eventration of the right hemidiaphragm. Stable cardiac silhouette. No acute osseous pathology. IMPRESSION: No active cardiopulmonary disease. Electronically Signed   By: Anner Crete M.D.   On: 09/17/2015 22:25    Conclusion     Ost LAD lesion, 90% stenosed.  Mid LAD lesion, 90% stenosed.  2nd Diag lesion, 99% stenosed.  Dist LAD-1 lesion, 80% stenosed.  Dist LAD-2 lesion, 90% stenosed.  Ost Cx to Prox Cx lesion, 70% stenosed.  Mid Cx lesion, 100% stenosed.  Mid RCA lesion, 80% stenosed.  2nd RPLB lesion, 99% stenosed.  Dist RCA lesion, 80% stenosed.  There is severe left ventricular systolic dysfunction.  Prox RCA lesion, 40% stenosed.   Severe diffuse multivessel CAD with ostial 90% LAD stenosis, diffuse 90% proximal to mid and 52 and 80% LAD stenoses with subtotal 99% stenosis in the second diagonal vessel; total occlusion of the proximal circumflex with collateral supplying the distal circumflex; and diffusely irregular RCA with 80% stenosis in the region of the acute margin and 99% PLA stenosis with left-to-right collaterals.  Severe global LV dysfunction compatible with an ischemic cardiomyopathy with diffuse hypokinesis and an ejection fraction of 25-30%. LVEDP 12 mm Hg  Dilated aortic root and ascending aorta.  RECOMMENDATION: Surgical consultation will be obtained for consideration of CABG revascularization surgery.     Indications    ST elevation myocardial infarction (STEMI), unspecified artery ([place]) [I21.3 (ICD-10-CM)]    Technique and Indications    Mr. Amond Speranza is an 82 year old Caucasian male who developed significant chest pain 2 days ago. He developed recurrent chest pain this evening and presented to [facility] emergency room. His ECG revealed diffuse global ST-wave abnormalities with 2 mm of ST elevation in lead aVR. He was given IV heparin and supplemental nitroglycerin and started on IV nitroglycerin drip. A code STEMI was activated  and he was transported to the [facility] catheterization laboratory for emergent cardiac catheterization.  Upon arrival to the laboratory the patient chest pain was improved but still had mild 1-2/10 chest pain. With a history of dye allergy he was pretreated with Solu-Medrol, Pepcid, and Benadryl. He received Versed 1 mg. His right femoral artery was punctured anteriorly and a 6 French sheath was inserted without difficulty. Diagnostic catheterization was done with 5 [place] JL 5 and JR4 diagnostic catheters. A 5 French pigtail catheter was used for left ventriculography. The patient was on an IV nitroglycerin drip and wiith his severe disease, his nitro drip was  increased. An ACT was checked and his sheath was pulled with plans for resumption of heparin and surgical consultation for CABG surgery in the morning. He left the catheterization laboratory with stable hemodynamics chest pain-free and was transported to the coronary care unit. During this procedure the patient was administered a total of Versed 1 mg to achieve and maintain moderate conscious sedation. The patient's heart rate, blood pressure, and oxygen saturation were monitored continuously during the procedure. The period of conscious sedation was 27 minutes, of which I was present face-to-face 100% of this time. Estimated blood loss <50 mL. There were no immediate complications during the procedure.    Coronary Findings    Dominance: Right   Left Main  The left main coronary artery was a large-caliber vessel which bifurcated into the LAD and left circumflex vessel     Left Anterior Descending  The LAD was diffusely diseased. There was diffuse 90% ostial stenosis proximal to the first diagonal vessel. There were segmental stenoses of 90, 80, 70 and 90 throughout the entire LAD. The second diagonal vessel was subtotally occluded in its midsegment   . Ost LAD lesion, 90% stenosed.   . Mid LAD lesion, 90% stenosed.   . Dist LAD-1 lesion, 80%  stenosed.   . Dist LAD-2 lesion, 90% stenosed.   . Second Diagonal Branch   . 2nd Diag lesion, 99% stenosed.     Left Circumflex  E left circumflex vessel is totally occluded proximally. There was collateralization of the distal circumflex. The the left coronary system   . Ost Cx to Prox Cx lesion, 70% stenosed.   . Mid Cx lesion, 100% stenosed.   . Third Obtuse Marginal Branch   3rd Mrg filled by collaterals from 3rd Sept.     Right Coronary Artery  RCA had diffuse irregularity and 40% proximal stenoses. There was 80% stenosis in the region of the acute margin. The vessel supply the PDA. Then the posterior lateral vessel. There was diffuse 99% stenosis in this posterior lateral vessel with retrograde filling of this be the distal LAD.   Marland Kitchen Prox RCA lesion, 40% stenosed.   . Mid RCA lesion, 80% stenosed.   . Dist RCA lesion, 80% stenosed.   . First Right Posterolateral   The vessel is small in size.   Marland Kitchen Second Right Posterolateral   2nd RPLB filled by collaterals from Dist LAD.   . 2nd RPLB lesion, 99% stenosed.      Wall Motion                 Left Heart    Left Ventricle The left ventricle is enlarged. There is severe left ventricular systolic dysfunction. The left ventricular ejection fraction is 25-35% by visual estimate. There are wall motion abnormalities in the left ventricle. Severe global LV dysfunction with an ejection fraction at 25-30%. LVEDP is 12 mmHg.   Aorta The aortic root and ascending aorta is dilated.    Coronary Diagrams    Diagnostic Diagram             Assessment/Plan:   Principal Problem:   Non-STEMI (non-ST elevated myocardial infarction) (HCC) Active Problems:   Hypertension   GERD (gastroesophageal reflux disease)   NSTEMI (non-ST elevated myocardial infarction) (HCC)   ST elevation myocardial infarction (STEMI) (HCC)   ST elevation (STEMI) myocardial infarction of unspecified site ([place])   1. Acute coronary syndrome with  severe multivessel CAD: See catheterization findings. 2.  Ischemic cardiomyopathy with EF 25-30% at  cath 3.  Essential hypertension 4. GERD  I have titrated IV nitroglycerin to 40 g.  Patient has been started on low-dose metoprolol at 12.5 mg twice a day and will titrate accordingly.  With his ischemic cardiomyopathy.  We'll give spironolactone 12.5 mg daily.  A 2-D echo Doppler study was recently completed; results pending.  Surgical consultation was requested for consideration of CABG revascularization surgery and states that he has been active prior to admission.   Troy Sine, MD, [facility] 09/18/2015, 11:31 AM

## 2015-09-18 NOTE — Progress Notes (Signed)
   09/18/15 0200  Clinical Encounter Type  Visited With Family  Visit Type ED  Referral From Nurse  Consult/Referral To Chaplain  Spiritual Encounters  Spiritual Needs Prayer;Emotional  Stress Factors  Family Stress Factors Health changes;Lack of knowledge  Chaplain responded to page, patient to transfer from [facility], second page received upon arrival of family, escorted to cath lab waiting area, provided hospitality, spiritual presence and prayer, continued follow-up made.

## 2015-09-18 NOTE — Care Management Important Message (Signed)
Important Message  Patient Details  Name: [name] [name] MRN: [id number] Date of Birth: 28-Apr-1928   Medicare Important Message Given:  Yes    [name] 09/18/2015, 8:43 AM

## 2015-09-18 NOTE — Progress Notes (Signed)
Discussed sternal precautions, mobility post op, d/c planning for if pt is for OHS. Pt has numerous family members who could take care of him at d/c. Gave OHS booklet, guideline, and video to watch. Pt is emotionally regarding the decision to have surgery or not as his twin sister died after a complication of OHS. Will not ambulate at this time due to recent MI and continued CP this am. Will f/u Dean, ACSM 1:41 PM 09/18/2015

## 2015-09-18 NOTE — Progress Notes (Signed)
  Echocardiogram 2D Echocardiogram with with definity 74mL has been performed.  Darlina Sicilian M 09/18/2015, 8:45 AM

## 2015-09-18 NOTE — Progress Notes (Signed)
ANTICOAGULATION CONSULT NOTE - Follow Up Consult  Pharmacy Consult for heparin Indication: s/p cath with multi-vessel disease  Allergies  Allergen Reactions  . Sulfa Antibiotics Palpitations  . Contrast Media [Iodinated Diagnostic Agents] Rash    ivp dye---  Pt states w/ pre-treatment does okay    Patient Measurements: Height: 5\' 10"  (177.8 cm) Weight: 186 lb (84.369 kg) IBW/kg (Calculated) : 73 Heparin Dosing Weight: 84.4 kg  Vital Signs: Temp: 98.1 F (36.7 C) (06/26 1636) Temp Source: Oral (06/26 1636) BP: 134/76 mmHg (06/26 1800) Pulse Rate: 58 (06/26 1800)  Labs:  Recent Labs  09/17/15 2135 09/18/15 0048 09/18/15 0339 09/18/15 1204 09/18/15 1500 09/18/15 1835  HGB 14.9  --  12.5*  --   --   --   HCT 43.1  --  38.0*  --   --   --   PLT 309  --  249  --   --   --   APTT  --  159*  --   --   --   --   LABPROT  --  16.4*  --   --   --   --   INR  --  1.31  --   --   --   --   HEPARINUNFRC  --   --   --   --   --  0.14*  CREATININE 0.90  --  0.97  --   --   --   TROPONINI 0.96*  --  1.17* 2.59* 3.40*  --     Estimated Creatinine Clearance: 57.5 mL/min (by C-G formula based on Cr of 0.97).   Medications:  Scheduled:  . aspirin  81 mg Oral Daily  . atorvastatin  80 mg Oral q1800  . cloNIDine  0.1 mg Oral BID  . levothyroxine  50 mcg Oral QAC breakfast  . metoprolol tartrate  12.5 mg Oral BID  . multivitamin  2 tablet Oral Daily  . pantoprazole  40 mg Oral Daily  . sodium chloride flush  3 mL Intravenous Q12H  . spironolactone  12.5 mg Oral Daily   Infusions:  . sodium chloride 25 mL/hr at 09/18/15 1334  . heparin 1,100 Units/hr (09/18/15 1124)  . nitroGLYCERIN 40 mcg/min (09/18/15 1225)    Assessment: 82 yo male s/p cath with multi-vessel disease is currently on subtherapeutic heparin. Heparin level is 0.14. No problem with infusion per RN Goal of Therapy:  Heparin level 0.3-0.7 units/ml Monitor platelets by anticoagulation protocol: Yes   Plan:   - - increase heparin to 1350 units/hr.  - 8 hr heparin level  Cecily Lawhorne, Tsz-Yin 09/18/2015,7:14 PM

## 2015-09-18 NOTE — H&P (Addendum)
[name] [name] is an 82 y.o. male.     Chief Complaint: chest pain Primary Cardiologist: new HPI: [name] [name] is an 82 yo man with PMH of GERD, hypertension and vocal cord cancer treated with radiation who presented to [facility] ER with substernal to left-sided chest discomfort that radiates to both arms for 2 hours prior to presentation this evening. He characterized the severity as bad as 9/10 with aching, heavy sensation. Of note, he had a similar episode 09/15/15 that lasted ~ 30 minutes and resolved on its on. Tonight, he took tums and 4 large aspirin with mild relief. Per his family, he has been under a lot of stress with son recently diagnosed with stage IV cancer.   He denies recent illness - no fever/chills/nausea/vomiting/diarrhea.   He was started on heparin gtt and given SL NTG x3 before starting IV NTG gtt. Given continued chest pain despite medications, urgent cardiac catheterization lab activation (elevated aVR, ST depression 2-3 mm in V3/V4).    Past Medical History  Diagnosis Date  . Hypertension   . GERD (gastroesophageal reflux disease)   . Prostate cancer ([place]) STAGE T2a,      FOLLOWED BY DR Diona Fanti AND DR MANNING  . H/O hiatal hernia   . Coronary atherosclerosis PER CT SCAN DONE 05-09-2011     CALCIFIED PLAQUE  . History of cancer of larynx 1996--- S/P  REMOVAL 1/3 VOICE BOX  AND RADIATION TX    NO RECURRENCE---  RESIDUAL , PT SPEECH A WHISPER  . Weakness of voice PT CAN ONLY WHISPER---  SECONDARY TO VOCAL CORD CA  S/P REMOVAL 1/3  VOICE BOX  . Hypothyroidism   . Heart murmur MILD--  ASYMPTOMATIC  . First degree heart block   . DJD (degenerative joint disease)     Past Surgical History  Procedure Laterality Date  . Larynx surgery  x3  1996    BX'S AND 1/3 OF VOICE BOX REMOVED DUE TO CANCER--  NO RECURRENCE---  (RESIDUAL , WHISPERS)  . Cataract extraction w/ intraocular lens  implant, bilateral    . Cardiovascular stress test  5 YRS AGO    PT STATES NORMAL    . Radioactive seed implant  08/02/2011    Procedure: RADIOACTIVE SEED IMPLANT;  Surgeon: Franchot Gallo, MD;  Location: [facility];  Service: Urology;  Laterality: N/A;  C-ARM RAD TECH OK PER [facility] AT MAIN  . Cystoscopy  08/02/2011    Procedure: CYSTOSCOPY FLEXIBLE;  Surgeon: Franchot Gallo, MD;  Location: [facility];  Service: Urology;  Laterality: N/A;    History reviewed. No pertinent family history. Social History:  reports that he quit smoking about 25 years ago. His smoking use included Cigarettes. He has a 135 pack-year smoking history. He has never used smokeless tobacco. He reports that he does not drink alcohol or use illicit drugs. No known family history of CAD or heart failure Allergies:  Allergies  Allergen Reactions  . Sulfa Antibiotics Palpitations  . Contrast Media [Iodinated Diagnostic Agents] Rash    ivp dye---  Pt states w/ pre-treatment does okay     (Not in a hospital admission)  Results for orders placed or performed during the hospital encounter of 09/17/15 (from the past 48 hour(s))  Basic metabolic panel     Status: Abnormal   Collection Time: 09/17/15  9:35 PM  Result Value Ref Range   Sodium 133 (L) 135 - 145 mmol/L   Potassium 2.9 (L) 3.5 - 5.1  mmol/L   Chloride 98 (L) 101 - 111 mmol/L   CO2 24 22 - 32 mmol/L   Glucose, Bld 125 (H) 65 - 99 mg/dL   BUN 14 6 - 20 mg/dL   Creatinine, Ser 0.90 0.61 - 1.24 mg/dL   Calcium 9.4 8.9 - 10.3 mg/dL   GFR calc non Af Amer >60 >60 mL/min   GFR calc Af Amer >60 >60 mL/min    Comment: (NOTE) The eGFR has been calculated using the CKD EPI equation. This calculation has not been validated in all clinical situations. eGFR's persistently <60 mL/min signify possible Chronic Kidney Disease.    Anion gap 11 5 - 15  CBC     Status: None   Collection Time: 09/17/15  9:35 PM  Result Value Ref Range   WBC 7.9 4.0 - 10.5 K/uL   RBC 4.92 4.22 - 5.81 MIL/uL   Hemoglobin 14.9 13.0 -  17.0 g/dL   HCT 43.1 39.0 - 52.0 %   MCV 87.6 78.0 - 100.0 fL   MCH 30.3 26.0 - 34.0 pg   MCHC 34.6 30.0 - 36.0 g/dL   RDW 13.1 11.5 - 15.5 %   Platelets 309 150 - 400 K/uL  Troponin I     Status: Abnormal   Collection Time: 09/17/15  9:35 PM  Result Value Ref Range   Troponin I 0.96 (HH) <0.031 ng/mL    Comment:        POSSIBLE MYOCARDIAL ISCHEMIA. SERIAL TESTING RECOMMENDED. CRITICAL RESULT CALLED TO, READ BACK BY AND VERIFIED WITH: TALBOT,T. AT 2345 ON 09/17/2015 BY AGUNDIZ,E.    Dg Chest 2 View  09/17/2015  CLINICAL DATA:  82 year old male with chest pain EXAM: CHEST  2 VIEW COMPARISON:  Chest radiograph dated 07/04/2011 FINDINGS: Two views of the chest demonstrate emphysematous changes of the lungs with bibasilar atelectasis/scarring. There is no focal consolidation or pneumothorax. Trace right pleural effusion versus pleural thickening. There is mild eventration of the right hemidiaphragm. Stable cardiac silhouette. No acute osseous pathology. IMPRESSION: No active cardiopulmonary disease. Electronically Signed   By: Anner Crete M.D.   On: 09/17/2015 22:25    Review of Systems  Constitutional: Positive for malaise/fatigue and diaphoresis. Negative for fever, chills and weight loss.  HENT: Positive for hearing loss. Negative for ear pain and tinnitus.   Eyes: Negative for double vision and photophobia.  Respiratory: Positive for shortness of breath. Negative for hemoptysis and sputum production.   Cardiovascular: Positive for chest pain and leg swelling. Negative for palpitations.  Gastrointestinal: Negative for nausea, vomiting and abdominal pain.  Genitourinary: Negative for dysuria and frequency.  Musculoskeletal: Negative for myalgias.  Skin: Negative for rash.  Neurological: Negative for dizziness and tingling.  Endo/Heme/Allergies: Negative for polydipsia.  Psychiatric/Behavioral: Negative for depression, hallucinations and substance abuse.    Blood pressure  102/80, pulse 109, resp. rate 22, height 5' 10" (1.778 m), weight 84.369 kg (186 lb), SpO2 94 %. Physical Exam  Nursing note and vitals reviewed. Constitutional: He is oriented to person, place, and time. He appears distressed.  Appears stated age, mildly ill  HENT:  Head: Normocephalic and atraumatic.  Nose: Nose normal.  Mouth/Throat: Oropharynx is clear and moist. No oropharyngeal exudate.  Eyes: Conjunctivae and EOM are normal. Pupils are equal, round, and reactive to light. No scleral icterus.  Neck: Normal range of motion. Neck supple. No JVD present. No tracheal deviation present.  Cardiovascular: Normal heart sounds and intact distal pulses.   No murmur heard. Irregularly irregular,  tachycardic  Respiratory: Effort normal and breath sounds normal. He has no wheezes. He has no rales.  GI: Soft. Bowel sounds are normal. He exhibits no distension. There is no tenderness. There is no rebound.  Musculoskeletal: Normal range of motion. He exhibits edema.  Trace lower extremity edema  Neurological: He is alert and oriented to person, place, and time. No cranial nerve deficit.  Skin: Skin is warm and dry. No rash noted. He is not diaphoretic. No erythema.  Psychiatric: He has a normal mood and affect. His behavior is normal.    Labs reviewed; bun/cr 14/0.9, trop 0.96, wbc 7.9, na 133, K 2.9, inr 1.06 EKG: ST depression V2-V6, elevated aVR, sinus tachycardia Assessment/Plan [name] [name] is an 82 yo man with PMH of GERD, hypertension and vocal cord cancer treated with radiation who presented to [facility] ER with substernal to left-sided chest discomfort that radiates to both arms for 2 hours prior to presentation this evening. Abnormal ECG and elevated troponin with near STEMI equivalent (elevated aVR with ST depression 2-3 mm V2-V3) leading to cath lab activation.  Problem List 1. NSTEMI - near STEMI equivalent with elevated aVR, V2-V3 ST depression and ongoing pain 2. Hypertension 3.  GERD 4. Dyslipidemia  Plan 1. Urgent cath lab activation, telemetry, he received large aspirin, heparin gtt 2. Hba1c, BNP, TSH, urinalysis 3. Echocardiogram in AM 4. Atorvastatin 80 mg qHS, first dose now 5. Daily asa 81 mg; defer P2Y12 based cath findings  Jules Husbands, MD 09/18/2015, 12:36 AM  Patient was seen and examined upon arrival to the cardiac catheterization laboratory in transfer from [facility] emergency room.  I also had a discussion with the [facility] emergency room physician, Dr Venita Sheffield.  [name] [name] is an 82 year old gentleman who has a history of hypertension, remote vocal cord cancer treated with radiation therapy.  He was unaware of any known prior history of CAD.  He had experienced and episode of chest discomfort 2 days previously, which lasted for proximally 30 minutes.  Today, he again developed recurrent substernal and left-sided chest discomfort radiating to both arms.  His pain intensified leading to presentation to [facility].  His initial ECG revealed a slightly irregular rhythm which is probably sinus , 93 bpm with PACs.  There is diffuse global ST segment depression and 2 mm of ST elevation in lead aVR. Due to concerns for probable high-grade multivessel disease or left main equivalent disease.  A code STEMI was activated and he was transported to [facility] for emergent catheterization.  Following heparinization, an IV nitroglycerin.  His chest pain has improved.  He presents now for acute catheterization.   Troy Sine, MD, [facility] 09/18/2015 11:23 AM

## 2015-09-18 NOTE — Progress Notes (Signed)
ANTICOAGULATION CONSULT NOTE - Preliminary  Pharmacy Consult for heparin  Indication: chest pain/ACS  Allergies  Allergen Reactions  . Sulfa Antibiotics Palpitations  . Contrast Media [Iodinated Diagnostic Agents] Rash    ivp dye---  Pt states w/ pre-treatment does okay    Patient Measurements: Height: 5\' 10"  (177.8 cm) Weight: 186 lb (84.369 kg) IBW/kg (Calculated) : 73 HEPARIN DW (KG): 84.4   Vital Signs: BP: 102/80 mmHg (06/26 0000) Pulse Rate: 109 (06/26 0000)  Labs:  Recent Labs  09/17/15 2135  HGB 14.9  HCT 43.1  PLT 309  CREATININE 0.90  TROPONINI 0.96*   Estimated Creatinine Clearance: 62 mL/min (by C-G formula based on Cr of 0.9).  Medical History: Past Medical History  Diagnosis Date  . Hypertension   . GERD (gastroesophageal reflux disease)   . Prostate cancer ([place]) STAGE T2a,      FOLLOWED BY DR Diona Fanti AND DR MANNING  . H/O hiatal hernia   . Coronary atherosclerosis PER CT SCAN DONE 05-09-2011     CALCIFIED PLAQUE  . History of cancer of larynx 1996--- S/P  REMOVAL 1/3 VOICE BOX  AND RADIATION TX    NO RECURRENCE---  RESIDUAL , PT SPEECH A WHISPER  . Weakness of voice PT CAN ONLY WHISPER---  SECONDARY TO VOCAL CORD CA  S/P REMOVAL 1/3  VOICE BOX  . Hypothyroidism   . Heart murmur MILD--  ASYMPTOMATIC  . First degree heart block   . DJD (degenerative joint disease)     Medications:  Scheduled:   Infusions:  . sodium chloride 75 mL/hr at 09/17/15 2302  . heparin    . potassium chloride 10 mEq (09/18/15 0020)   PRN: nitroGLYCERIN Anti-infectives    None      Assessment: 82 yo with chest pain radiating to both arms. Starting heparin for ACS/STEMI.   Goal of Therapy:  Heparin level 0.3-0.7 units/ml   Plan:  Give 4000 units bolus x 1 Start heparin infusion at 1100 units/hr Check anti-Xa level in 8 hours and daily while on heparin Continue to monitor H&H and platelets   Bolus and infusion ordered by ED MD. Verified dose and  ordered INR and PTT. Plts OK.   Preliminary review of pertinent patient information completed.  Forestine Na clinical pharmacist will complete review during morning rounds to assess the patient and finalize treatment regimen.  Kaija Kovacevic, Magdalene Molly, [place] 09/18/2015,12:21 AM

## 2015-09-19 ENCOUNTER — Inpatient Hospital Stay (HOSPITAL_COMMUNITY): Payer: Medicare HMO

## 2015-09-19 DIAGNOSIS — I10 Essential (primary) hypertension: Secondary | ICD-10-CM

## 2015-09-19 DIAGNOSIS — K219 Gastro-esophageal reflux disease without esophagitis: Secondary | ICD-10-CM

## 2015-09-19 DIAGNOSIS — I77810 Thoracic aortic ectasia: Secondary | ICD-10-CM

## 2015-09-19 DIAGNOSIS — I2510 Atherosclerotic heart disease of native coronary artery without angina pectoris: Secondary | ICD-10-CM

## 2015-09-19 DIAGNOSIS — I214 Non-ST elevation (NSTEMI) myocardial infarction: Principal | ICD-10-CM

## 2015-09-19 LAB — URINALYSIS, ROUTINE W REFLEX MICROSCOPIC
Bilirubin Urine: NEGATIVE
Glucose, UA: NEGATIVE mg/dL
Ketones, ur: NEGATIVE mg/dL
Leukocytes, UA: NEGATIVE
Nitrite: NEGATIVE
Protein, ur: NEGATIVE mg/dL
Specific Gravity, Urine: 1.016 (ref 1.005–1.030)
pH: 6 (ref 5.0–8.0)

## 2015-09-19 LAB — BASIC METABOLIC PANEL
ANION GAP: 8 (ref 5–15)
BUN: 15 mg/dL (ref 6–20)
CALCIUM: 8.5 mg/dL — AB (ref 8.9–10.3)
CO2: 21 mmol/L — ABNORMAL LOW (ref 22–32)
CREATININE: 1 mg/dL (ref 0.61–1.24)
Chloride: 103 mmol/L (ref 101–111)
Glucose, Bld: 103 mg/dL — ABNORMAL HIGH (ref 65–99)
Potassium: 3.9 mmol/L (ref 3.5–5.1)
SODIUM: 132 mmol/L — AB (ref 135–145)

## 2015-09-19 LAB — VAS US DOPPLER PRE CABG
LEFT ECA DIAS: -1 cm/s
LEFT VERTEBRAL DIAS: 10 cm/s
Left CCA dist dias: 11 cm/s
Left CCA dist sys: 76 cm/s
Left CCA prox dias: 8 cm/s
Left CCA prox sys: 64 cm/s
Left ICA dist dias: -18 cm/s
Left ICA dist sys: -72 cm/s
Left ICA prox dias: -25 cm/s
Left ICA prox sys: -84 cm/s
RIGHT ECA DIAS: -11 cm/s
RIGHT VERTEBRAL DIAS: 10 cm/s
Right CCA prox dias: 17 cm/s
Right CCA prox sys: 58 cm/s
Right cca dist sys: -46 cm/s

## 2015-09-19 LAB — URINE MICROSCOPIC-ADD ON

## 2015-09-19 LAB — CBC
HCT: 32.4 % — ABNORMAL LOW (ref 39.0–52.0)
Hemoglobin: 10.6 g/dL — ABNORMAL LOW (ref 13.0–17.0)
MCH: 28.6 pg (ref 26.0–34.0)
MCHC: 32.7 g/dL (ref 30.0–36.0)
MCV: 87.3 fL (ref 78.0–100.0)
PLATELETS: 233 10*3/uL (ref 150–400)
RBC: 3.71 MIL/uL — AB (ref 4.22–5.81)
RDW: 13.5 % (ref 11.5–15.5)
WBC: 13.6 10*3/uL — AB (ref 4.0–10.5)

## 2015-09-19 LAB — BRAIN NATRIURETIC PEPTIDE: B NATRIURETIC PEPTIDE 5: 356.6 pg/mL — AB (ref 0.0–100.0)

## 2015-09-19 LAB — HEMOGLOBIN A1C
HEMOGLOBIN A1C: 5.1 % (ref 4.8–5.6)
MEAN PLASMA GLUCOSE: 100 mg/dL

## 2015-09-19 LAB — HEPARIN LEVEL (UNFRACTIONATED)
Heparin Unfractionated: 0.57 IU/mL (ref 0.30–0.70)
Heparin Unfractionated: 0.53 IU/mL (ref 0.30–0.70)

## 2015-09-19 LAB — TSH: TSH: 0.837 u[IU]/mL (ref 0.350–4.500)

## 2015-09-19 LAB — SURGICAL PCR SCREEN
MRSA, PCR: NEGATIVE
Staphylococcus aureus: NEGATIVE

## 2015-09-19 MED ORDER — RANOLAZINE ER 500 MG PO TB12
500.0000 mg | ORAL_TABLET | Freq: Two times a day (BID) | ORAL | Status: DC
  Administered 2015-09-19 – 2015-09-21 (×6): 500 mg via ORAL
  Filled 2015-09-19 (×7): qty 1

## 2015-09-19 MED ORDER — METOPROLOL TARTRATE 12.5 MG HALF TABLET
12.5000 mg | ORAL_TABLET | Freq: Two times a day (BID) | ORAL | Status: DC
  Administered 2015-09-19 – 2015-09-21 (×4): 12.5 mg via ORAL
  Filled 2015-09-19 (×5): qty 1

## 2015-09-19 MED ORDER — AMLODIPINE BESYLATE 2.5 MG PO TABS
2.5000 mg | ORAL_TABLET | Freq: Every day | ORAL | Status: DC
  Administered 2015-09-19 – 2015-09-20 (×2): 2.5 mg via ORAL
  Filled 2015-09-19 (×3): qty 1

## 2015-09-19 MED FILL — Nitroglycerin IV Soln 100 MCG/ML in D5W: INTRA_ARTERIAL | Qty: 10 | Status: AC

## 2015-09-19 NOTE — Progress Notes (Signed)
1 Day Post-Op Procedure(s) (LRB): Left Heart Cath and Coronary Angiography (N/A) Subjective: Now,in CCU for rest angina - now pain free Not candidate for PCI per DR TK CT chest shows mod calcification of ascending aorta - operable Plan high risk CABG 7-30 am, first available OR opening   Objective: Vital signs in last 24 hours: Temp:  [97.9 F (36.6 C)-98.4 F (36.9 C)] 98.4 F (36.9 C) (06/27 1614) Pulse Rate:  [42-104] 104 (06/27 1400) Cardiac Rhythm:  [-] Sinus bradycardia (06/27 0800) Resp:  [16-44] 23 (06/27 1400) BP: (96-134)/(54-83) 117/69 mmHg (06/27 1614) SpO2:  [89 %-100 %] 100 % (06/27 1400) Weight:  [179 lb 3.7 oz (81.3 kg)] 179 lb 3.7 oz (81.3 kg) (06/27 0500)  Hemodynamic parameters for last 24 hours:  nsr  Intake/Output from previous day: 06/26 0701 - 06/27 0700 In: 2087.5 [P.O.:720; I.V.:1367.5] Out: 2300 [Urine:2300] Intake/Output this shift: Total I/O In: 814.5 [P.O.:360; I.V.:454.5] Out: 1100 [Urine:1100]      Exam    General- alert and comfortable   Lungs- clear without rales, wheezes   Cor- regular rate and rhythm, no murmur , gallop   Abdomen- soft, non-tender   Extremities - warm, non-tender, minimal edema   Neuro- oriented, appropriate, no focal weakness   Lab Results:  Recent Labs  09/18/15 0339 09/19/15 0312  WBC 8.6 13.6*  HGB 12.5* 10.6*  HCT 38.0* 32.4*  PLT 249 233   BMET:  Recent Labs  09/18/15 0339 09/19/15 0312  NA 132* 132*  K 4.0 3.9  CL 101 103  CO2 23 21*  GLUCOSE 119* 103*  BUN 11 15  CREATININE 0.97 1.00  CALCIUM 8.7* 8.5*    PT/INR:  Recent Labs  09/18/15 0048  LABPROT 16.4*  INR 1.31   ABG No results found for: PHART, HCO3, TCO2, ACIDBASEDEF, O2SAT CBG (last 3)  No results for input(s): GLUCAP in the last 72 hours.  Assessment/Plan: S/P Procedure(s) (LRB): Left Heart Cath and Coronary Angiography (N/A) CABG fri am   LOS: 1 day    [name] [name] 09/19/2015

## 2015-09-19 NOTE — Progress Notes (Addendum)
Pre-op Cardiac Surgery  Carotid Findings:  Bilateral: No significant (1-39%) ICA stenosis. Antegrade vertebral flow.    Upper Extremity Right Left  Brachial Pressures 112 107  Radial Waveforms Tri Tri  Ulnar Waveforms Tri Tri  Palmar Arch (Allen's Test) Decreases >50% with radial compression, normal with ulnar compression decreases >50% with radial compression, normal with ulnar compression   Findings:   Bilateral ABI within normal limits.   Lower  Extremity Right Left  Dorsalis Pedis    Anterior Tibial 70, Bi 109, Bi  Posterior Tibial 107, Bi 124, Bi  Ankle/Brachial Indices 0.96 1.11    Landry Mellow, RDMS, RVT 09/19/2015

## 2015-09-19 NOTE — Progress Notes (Signed)
[place] for Heparin  Indication: S/P Cath with multi-vessel disease  Allergies  Allergen Reactions  . Sulfa Antibiotics Palpitations  . Contrast Media [Iodinated Diagnostic Agents] Rash    ivp dye---  Pt states w/ pre-treatment does okay    Patient Measurements: Height: 5\' 10"  (177.8 cm) Weight: 179 lb 3.7 oz (81.3 kg) IBW/kg (Calculated) : 73  Vital Signs: Temp: 97.9 F (36.6 C) (06/27 1229) Temp Source: Oral (06/27 1229) BP: 105/73 mmHg (06/27 1300) Pulse Rate: 65 (06/27 1300)  Labs:  Recent Labs  09/17/15 2135 09/18/15 0048 09/18/15 0339 09/18/15 1204 09/18/15 1500 09/18/15 1835 09/19/15 0312 09/19/15 1204  HGB 14.9  --  12.5*  --   --   --  10.6*  --   HCT 43.1  --  38.0*  --   --   --  32.4*  --   PLT 309  --  249  --   --   --  233  --   APTT  --  159*  --   --   --   --   --   --   LABPROT  --  16.4*  --   --   --   --   --   --   INR  --  1.31  --   --   --   --   --   --   HEPARINUNFRC  --   --   --   --   --  0.14* 0.57 0.53  CREATININE 0.90  --  0.97  --   --   --  1.00  --   TROPONINI 0.96*  --  1.17* 2.59* 3.40*  --   --   --     Estimated Creatinine Clearance: 55.8 mL/min (by C-G formula based on Cr of 1).  Assessment: 82 y/o M s/p cath with multi-vessel disease on heparin. Plans for  PCI vs high risk CABG -Heparin level is confirmed at goal (HL= 0.53)  Goal of Therapy:  Heparin level 0.3-0.7 units/ml Monitor platelets by anticoagulation protocol: Yes   Plan:  -Cont heparin at 1350 units/hr -Daily CBC/HL -F/U TCTS plans  Hildred Laser, Pharm D 09/19/2015 1:40 PM

## 2015-09-19 NOTE — Progress Notes (Signed)
Subjective:  Feels better but again had recurrent chest pain earlier today.  Objective:   Vital Signs : Filed Vitals:   09/19/15 0630 09/19/15 0700 09/19/15 0800 09/19/15 0830  BP: 107/58 103/77 107/65   Pulse: 50 53 62   Temp:    98.1 F (36.7 C)  TempSrc:    Oral  Resp: 23 44 21   Height:      Weight:      SpO2: 97% 97% 97%     Intake/Output from previous day:  Intake/Output Summary (Last 24 hours) at 09/19/15 0859 Last data filed at 09/19/15 0800  Gross per 24 hour  Intake 2137.98 ml  Output   2325 ml  Net -187.02 ml    I/O since admission: +1052  Wt Readings from Last 3 Encounters:  09/19/15 179 lb 3.7 oz (81.3 kg)  08/22/11 189 lb (85.73 kg)  07/31/11 190 lb (86.183 kg)    Medications: . aspirin  81 mg Oral Daily  . atorvastatin  80 mg Oral q1800  . cloNIDine  0.1 mg Oral BID  . levothyroxine  50 mcg Oral QAC breakfast  . metoprolol tartrate  12.5 mg Oral BID  . multivitamin  2 tablet Oral Daily  . pantoprazole  40 mg Oral Daily  . sodium chloride flush  3 mL Intravenous Q12H  . spironolactone  12.5 mg Oral Daily    . sodium chloride 25 mL/hr at 09/18/15 1334  . heparin 1,350 Units/hr (09/19/15 0530)  . nitroGLYCERIN 40 mcg/min (09/19/15 0700)    Physical Exam:   General appearance: alert, cooperative and no distress Neck: no adenopathy, no carotid bruit, no JVD, supple, symmetrical, trachea midline and thyroid not enlarged, symmetric, no tenderness/mass/nodules Lungs: basilar rales/ decreased BS at bases Heart: regular rhtyhm HR 44-967; 1/6 systolic murmur, no S4 gallop.  No rubs thrills or heaves Abdomen: soft, non-tender; bowel sounds normal; no masses,  no organomegaly Extremities: no edema, redness or tenderness in the calves or thighs Pulses: 2+ and symmetric Skin: Skin color, texture, turgor normal. No rashes or lesions Neurologic: Grossly normal   Rate: 69  Rhythm: NSR  6/27/17ECG (independently read by me): Sinus bradycardia at 50  with sinus arrythmia and PVC  09/18/15 ECG (independently read by me): Normal sinus rhythm at 74 bpm.  First-degree AV block.  Improvement in initial ST abnormalities from initial ECG of 625/17  09/17/15   ECG (independently read by me): Sinus rhythm at 93 bpm with PACs.  Diffuse global ST segment changes with 2 mm ST elevation in lead aVR  Lab Results:   Recent Labs  09/17/15 2135 09/18/15 0339 09/19/15 0312  NA 133* 132* 132*  K 2.9* 4.0 3.9  CL 98* 101 103  CO2 24 23 21*  GLUCOSE 125* 119* 103*  BUN 14 11 15   CREATININE 0.90 0.97 1.00  CALCIUM 9.4 8.7* 8.5*  MG  --  1.7  --     Hepatic Function Latest Ref Rng 09/18/2015 07/26/2011  Total Protein 6.5 - 8.1 g/dL 6.1(L) 7.1  Albumin 3.5 - 5.0 g/dL 3.4(L) 3.9  AST 15 - 41 U/L 31 14  ALT 17 - 63 U/L 13(L) 11  Alk Phosphatase 38 - 126 U/L 40 40  Total Bilirubin 0.3 - 1.2 mg/dL 0.5 0.4     Recent Labs  09/17/15 2135 09/18/15 0339 09/19/15 0312  WBC 7.9 8.6 13.6*  HGB 14.9 12.5* 10.6*  HCT 43.1 38.0* 32.4*  MCV 87.6 87.4 87.3  PLT 309 249  233     Recent Labs  09/18/15 0339 09/18/15 1204 09/18/15 1500  TROPONINI 1.17* 2.59* 3.40*    Lab Results  Component Value Date   TSH 0.837 09/19/2015    Recent Labs  09/18/15 0339  HGBA1C 5.1     Recent Labs  09/18/15 0339  PROT 6.1*  ALBUMIN 3.4*  AST 31  ALT 13*  ALKPHOS 40  BILITOT 0.5    Recent Labs  09/18/15 0048  INR 1.31   BNP (last 3 results)  Recent Labs  09/18/15 0339 09/19/15 0312  BNP 135.4* 356.6*    ProBNP (last 3 results) No results for input(s): PROBNP in the last 8760 hours.   Lipid Panel     Component Value Date/Time   CHOL 164 09/18/2015 0339   TRIG 77 09/18/2015 0339   HDL 33* 09/18/2015 0339   CHOLHDL 5.0 09/18/2015 0339   VLDL 15 09/18/2015 0339   LDLCALC 116* 09/18/2015 0339      Imaging:  Dg Chest 2 View  09/17/2015  CLINICAL DATA:  82 year old male with chest pain EXAM: CHEST  2 VIEW COMPARISON:  Chest  radiograph dated 07/04/2011 FINDINGS: Two views of the chest demonstrate emphysematous changes of the lungs with bibasilar atelectasis/scarring. There is no focal consolidation or pneumothorax. Trace right pleural effusion versus pleural thickening. There is mild eventration of the right hemidiaphragm. Stable cardiac silhouette. No acute osseous pathology. IMPRESSION: No active cardiopulmonary disease. Electronically Signed   By: Anner Crete M.D.   On: 09/17/2015 22:25    Conclusion     Ost LAD lesion, 90% stenosed.  Mid LAD lesion, 90% stenosed.  2nd Diag lesion, 99% stenosed.  Dist LAD-1 lesion, 80% stenosed.  Dist LAD-2 lesion, 90% stenosed.  Ost Cx to Prox Cx lesion, 70% stenosed.  Mid Cx lesion, 100% stenosed.  Mid RCA lesion, 80% stenosed.  2nd RPLB lesion, 99% stenosed.  Dist RCA lesion, 80% stenosed.  There is severe left ventricular systolic dysfunction.  Prox RCA lesion, 40% stenosed.   Severe diffuse multivessel CAD with ostial 90% LAD stenosis, diffuse 90% proximal to mid and 19 and 80% LAD stenoses with subtotal 99% stenosis in the second diagonal vessel; total occlusion of the proximal circumflex with collateral supplying the distal circumflex; and diffusely irregular RCA with 80% stenosis in the region of the acute margin and 99% PLA stenosis with left-to-right collaterals.  Severe global LV dysfunction compatible with an ischemic cardiomyopathy with diffuse hypokinesis and an ejection fraction of 25-30%. LVEDP 12 mm Hg  Dilated aortic root and ascending aorta.  RECOMMENDATION: Surgical consultation will be obtained for consideration of CABG revascularization surgery.     Indications    ST elevation myocardial infarction (STEMI), unspecified artery ([place]) [I21.3 (ICD-10-CM)]    Technique and Indications    [name] is an 82 year old Caucasian male who developed significant chest pain 2 days ago. He developed recurrent chest pain this  evening and presented to [facility] emergency room. His ECG revealed diffuse global ST-wave abnormalities with 2 mm of ST elevation in lead aVR. He was given IV heparin and supplemental nitroglycerin and started on IV nitroglycerin drip. A code STEMI was activated and he was transported to the [facility] catheterization laboratory for emergent cardiac catheterization.  Upon arrival to the laboratory the patient chest pain was improved but still had mild 1-2/10 chest pain. With a history of dye allergy he was pretreated with Solu-Medrol, Pepcid, and Benadryl. He received Versed 1 mg. His right femoral artery  was punctured anteriorly and a 6 French sheath was inserted without difficulty. Diagnostic catheterization was done with 5 [place] JL 5 and JR4 diagnostic catheters. A 5 French pigtail catheter was used for left ventriculography. The patient was on an IV nitroglycerin drip and wiith his severe disease, his nitro drip was increased. An ACT was checked and his sheath was pulled with plans for resumption of heparin and surgical consultation for CABG surgery in the morning. He left the catheterization laboratory with stable hemodynamics chest pain-free and was transported to the coronary care unit. During this procedure the patient was administered a total of Versed 1 mg to achieve and maintain moderate conscious sedation. The patient's heart rate, blood pressure, and oxygen saturation were monitored continuously during the procedure. The period of conscious sedation was 27 minutes, of which I was present face-to-face 100% of this time. Estimated blood loss <50 mL. There were no immediate complications during the procedure.    Coronary Findings    Dominance: Right   Left Main  The left main coronary artery was a large-caliber vessel which bifurcated into the LAD and left circumflex vessel     Left Anterior Descending  The LAD was diffusely diseased. There was diffuse 90% ostial stenosis proximal to the first  diagonal vessel. There were segmental stenoses of 90, 80, 70 and 90 throughout the entire LAD. The second diagonal vessel was subtotally occluded in its midsegment   . Ost LAD lesion, 90% stenosed.   . Mid LAD lesion, 90% stenosed.   . Dist LAD-1 lesion, 80% stenosed.   . Dist LAD-2 lesion, 90% stenosed.   . Second Diagonal Branch   . 2nd Diag lesion, 99% stenosed.     Left Circumflex  E left circumflex vessel is totally occluded proximally. There was collateralization of the distal circumflex. The the left coronary system   . Ost Cx to Prox Cx lesion, 70% stenosed.   . Mid Cx lesion, 100% stenosed.   . Third Obtuse Marginal Branch   3rd Mrg filled by collaterals from 3rd Sept.     Right Coronary Artery  RCA had diffuse irregularity and 40% proximal stenoses. There was 80% stenosis in the region of the acute margin. The vessel supply the PDA. Then the posterior lateral vessel. There was diffuse 99% stenosis in this posterior lateral vessel with retrograde filling of this be the distal LAD.   Marland Kitchen Prox RCA lesion, 40% stenosed.   . Mid RCA lesion, 80% stenosed.   . Dist RCA lesion, 80% stenosed.   . First Right Posterolateral   The vessel is small in size.   Marland Kitchen Second Right Posterolateral   2nd RPLB filled by collaterals from Dist LAD.   . 2nd RPLB lesion, 99% stenosed.      Wall Motion                 Left Heart    Left Ventricle The left ventricle is enlarged. There is severe left ventricular systolic dysfunction. The left ventricular ejection fraction is 25-35% by visual estimate. There are wall motion abnormalities in the left ventricle. Severe global LV dysfunction with an ejection fraction at 25-30%. LVEDP is 12 mmHg.   Aorta The aortic root and ascending aorta is dilated.    Coronary Diagrams    Diagnostic Diagram           ------------------------------------------------------------------- ECHO Study Conclusions  - Left ventricle: There appears to be  hypokinesis in the basal and  mid inferolateral and anterolateral  walls. The cavity size was  normal. There was mild focal basal hypertrophy of the septum.  Systolic function was normal. The estimated ejection fraction was  in the range of 50% to 55%. Wall motion was normal; there were no  regional wall motion abnormalities. The study is not technically  sufficient to allow evaluation of LV diastolic function. - Aortic valve: There was mild regurgitation. - Mitral valve: There was mild regurgitation. - Left atrium: The atrium was mildly dilated. - Right ventricle: The cavity size was normal. Wall thickness was  normal. Systolic function was normal. - Right atrium: The atrium was normal in size. - Pulmonary arteries: Systolic pressure was within the normal  range. - Inferior vena cava: The vessel was normal in size. - Pericardium, extracardiac: There was no pericardial effusion.   Assessment/Plan:   Principal Problem:   Non-STEMI (non-ST elevated myocardial infarction) ([place]) Active Problems:   Hypertension   GERD (gastroesophageal reflux disease)   NSTEMI (non-ST elevated myocardial infarction) ([facility])   ST elevation myocardial infarction (STEMI) ([facility])   ST elevation (STEMI) myocardial infarction of unspecified site ([place])   1. Acute coronary syndrome with severe multivessel CAD. Vessels are not suitable for PCI; under evaluation with Dr. Nils Pyle for possible CABG, although high risk. 2. Dilated aortic root and ascending aorta;  For CT today  3.  Ischemic cardiomyopathy with EF 25-30% at cath 4.  Essential hypertension 5. GERD 6. H/o prostate Ca 7. Remote laryngectomy in 1996 for Ca  With recurrent chest pain, continue pt on  IV nitroglycerin  40 g and heparin.    Patient is on low-dose metoprolol at 12.5 mg twice a day and will keep at present dose today with bradycardia on ECG.   Will add amlodipine 2.5 mg today and also ranexa 500 mg bid. With his acute ischemic  cardiomyopathy spironolactone 12.5 mg daily was started yesterday. EF on echo yesterday is improved to 50 - 55% with Rx.  Surgical evaluation is in progress.  Time spent: 35 minutes   Troy Sine, MD, [facility] 09/19/2015, 8:59 AM

## 2015-09-19 NOTE — Progress Notes (Signed)
[place] for Heparin  Indication: S/P Cath with multi-vessel disease  Allergies  Allergen Reactions  . Sulfa Antibiotics Palpitations  . Contrast Media [Iodinated Diagnostic Agents] Rash    ivp dye---  Pt states w/ pre-treatment does okay    Patient Measurements: Height: 5\' 10"  (177.8 cm) Weight: 186 lb (84.369 kg) IBW/kg (Calculated) : 73  Vital Signs: Temp: 98.1 F (36.7 C) (06/27 0400) Temp Source: Oral (06/27 0400) BP: 117/70 mmHg (06/27 0400) Pulse Rate: 59 (06/27 0400)  Labs:  Recent Labs  09/17/15 2135 09/18/15 0048 09/18/15 0339 09/18/15 1204 09/18/15 1500 09/18/15 1835 09/19/15 0312  HGB 14.9  --  12.5*  --   --   --  10.6*  HCT 43.1  --  38.0*  --   --   --  32.4*  PLT 309  --  249  --   --   --  233  APTT  --  159*  --   --   --   --   --   LABPROT  --  16.4*  --   --   --   --   --   INR  --  1.31  --   --   --   --   --   HEPARINUNFRC  --   --   --   --   --  0.14* 0.57  CREATININE 0.90  --  0.97  --   --   --  1.00  TROPONINI 0.96*  --  1.17* 2.59* 3.40*  --   --     Estimated Creatinine Clearance: 55.8 mL/min (by C-G formula based on Cr of 1).  Assessment: 82 y/o M s/p cath with multi-vessel disease, considering PCI vs high risk CABG at this point, HL is therapeutic x 1 after rate increase  Goal of Therapy:  Heparin level 0.3-0.7 units/ml Monitor platelets by anticoagulation protocol: Yes   Plan:  -Cont heparin at 1350 units/hr -1200 HL -Daily CBC/HL -Monitor for bleeding -F/U TCTS plans  Narda Bonds 09/19/2015,4:46 AM

## 2015-09-20 ENCOUNTER — Inpatient Hospital Stay (HOSPITAL_COMMUNITY): Payer: Medicare HMO

## 2015-09-20 ENCOUNTER — Encounter (HOSPITAL_COMMUNITY): Payer: Medicare Other

## 2015-09-20 DIAGNOSIS — E785 Hyperlipidemia, unspecified: Secondary | ICD-10-CM

## 2015-09-20 LAB — SPIROMETRY WITH GRAPH
FEF 25-75 Post: 1.64 L/sec
FEF 25-75 Pre: 1.05 L/sec
FEF2575-%Change-Post: 55 %
FEF2575-%Pred-Post: 96 %
FEF2575-%Pred-Pre: 62 %
FEV1-%Change-Post: 7 %
FEV1-%Pred-Post: 57 %
FEV1-%Pred-Pre: 53 %
FEV1-Post: 1.51 L
FEV1-Pre: 1.4 L
FEV1FVC-%Change-Post: 0 %
FEV1FVC-%Pred-Pre: 108 %
FEV6-%Change-Post: 5 %
FEV6-%Pred-Post: 55 %
FEV6-%Pred-Pre: 52 %
FEV6-Post: 1.94 L
FEV6-Pre: 1.84 L
FEV6FVC-%Change-Post: 0 %
FEV6FVC-%Pred-Post: 107 %
FEV6FVC-%Pred-Pre: 106 %
FVC-%Change-Post: 8 %
FVC-%Pred-Post: 52 %
FVC-%Pred-Pre: 48 %
FVC-Post: 2 L
FVC-Pre: 1.85 L
Post FEV1/FVC ratio: 75 %
Post FEV6/FVC ratio: 100 %
Pre FEV1/FVC ratio: 76 %
Pre FEV6/FVC Ratio: 99 %

## 2015-09-20 LAB — BASIC METABOLIC PANEL
Anion gap: 8 (ref 5–15)
BUN: 16 mg/dL (ref 6–20)
CALCIUM: 8.2 mg/dL — AB (ref 8.9–10.3)
CO2: 22 mmol/L (ref 22–32)
CREATININE: 1.14 mg/dL (ref 0.61–1.24)
Chloride: 103 mmol/L (ref 101–111)
GFR calc Af Amer: >60 mL/min (ref >60)
GFR, EST NON AFRICAN AMERICAN: 57 mL/min — AB (ref >60)
Glucose, Bld: 99 mg/dL (ref 65–99)
POTASSIUM: 4.3 mmol/L (ref 3.5–5.1)
SODIUM: 133 mmol/L — AB (ref 135–145)

## 2015-09-20 LAB — BRAIN NATRIURETIC PEPTIDE: B NATRIURETIC PEPTIDE 5: 499.8 pg/mL — AB (ref 0.0–100.0)

## 2015-09-20 LAB — CBC
HCT: 31.7 % — ABNORMAL LOW (ref 39.0–52.0)
Hemoglobin: 10.6 g/dL — ABNORMAL LOW (ref 13.0–17.0)
MCH: 29.7 pg (ref 26.0–34.0)
MCHC: 33.4 g/dL (ref 30.0–36.0)
MCV: 88.8 fL (ref 78.0–100.0)
PLATELETS: 247 10*3/uL (ref 150–400)
RBC: 3.57 MIL/uL — AB (ref 4.22–5.81)
RDW: 13.6 % (ref 11.5–15.5)
WBC: 11.4 10*3/uL — AB (ref 4.0–10.5)

## 2015-09-20 LAB — HEPARIN LEVEL (UNFRACTIONATED): Heparin Unfractionated: 0.64 IU/mL (ref 0.30–0.70)

## 2015-09-20 MED ORDER — CETYLPYRIDINIUM CHLORIDE 0.05 % MT LIQD
7.0000 mL | Freq: Two times a day (BID) | OROMUCOSAL | Status: DC
  Administered 2015-09-21: 7 mL via OROMUCOSAL

## 2015-09-20 MED ORDER — ALBUTEROL SULFATE (2.5 MG/3ML) 0.083% IN NEBU
2.5000 mg | INHALATION_SOLUTION | Freq: Once | RESPIRATORY_TRACT | Status: AC
  Administered 2015-09-20: 2.5 mg via RESPIRATORY_TRACT

## 2015-09-20 NOTE — Progress Notes (Signed)
CT surgery  Patient with resting angina on IV nitroglycerin and heparin At age 82 with poor targets he is not candidate for emergency CABG Would recommend placement of preoperative intra-aortic balloon pump to stabilize patient prior to surgery if he continues to have chest pain/low blood pressure

## 2015-09-20 NOTE — Progress Notes (Signed)
Patient called nurse to bedside for 5/10 chest pain radiating to left arm. Oxygen increased and chest pain resided. EKG completed. Azeem, cards fellow, gave verbal order for nitroglycerin titration. Will continue to monitor patient.

## 2015-09-20 NOTE — Progress Notes (Addendum)
1845: RN called to bedside by NT, Pt c/o Mid sternal CP 6/10. Diaphoretic, pale, SpO2 77, NSR HR 85 increased, BP elevated.  IV tubing found in bed, unhooked from patient-accidental. Probable cause of CP onset. Restarted NTG gtt, gave 2mg  morphine, increase O2 [place] to 5LPM. EKG performed. Monitoring closely.   1849: CP down to 4/10. SpO2 remains at 79-84%, placed on NRB at 15LPM. Continue to monitor at bedside.   1900: CP 1/10. palor appropriate for ethnicity, skin dry. BP decreasing, SpO2 97% NRB mask. HR 75 NSR. Pt making conversing and smiling.

## 2015-09-20 NOTE — Progress Notes (Addendum)
[place] for Heparin  Indication: S/P Cath with multi-vessel disease  Allergies  Allergen Reactions  . Sulfa Antibiotics Palpitations  . Contrast Media [Iodinated Diagnostic Agents] Rash    ivp dye---  Pt states w/ pre-treatment does okay    Patient Measurements: Height: 5\' 10"  (177.8 cm) Weight: 177 lb 4.8 oz (80.423 kg) IBW/kg (Calculated) : 73  Vital Signs: Temp: 98.7 F (37.1 C) (06/28 0756) Temp Source: Oral (06/28 0756) BP: 109/66 mmHg (06/28 0800) Pulse Rate: 56 (06/28 0800)  Labs:  Recent Labs  09/18/15 0048 09/18/15 0339 09/18/15 1204 09/18/15 1500  09/19/15 0312 09/19/15 1204 09/20/15 0245 09/20/15 0320  HGB  --  12.5*  --   --   --  10.6*  --  10.6*  --   HCT  --  38.0*  --   --   --  32.4*  --  31.7*  --   PLT  --  249  --   --   --  233  --  247  --   APTT 159*  --   --   --   --   --   --   --   --   LABPROT 16.4*  --   --   --   --   --   --   --   --   INR 1.31  --   --   --   --   --   --   --   --   HEPARINUNFRC  --   --   --   --   < > 0.57 0.53  --  0.64  CREATININE  --  0.97  --   --   --  1.00  --  1.14  --   TROPONINI  --  1.17* 2.59* 3.40*  --   --   --   --   --   < > = values in this interval not displayed.  Estimated Creatinine Clearance: 48.9 mL/min (by C-G formula based on Cr of 1.14).  Assessment: 82 y/o M s/p cath with multi-vessel disease on heparin. Plans for CABG on 7/30.  -Heparin level is confirmed at goal (HL= 0.64)  Goal of Therapy:  Heparin level 0.3-0.7 units/ml Monitor platelets by anticoagulation protocol: Yes   Plan:  -Cont heparin at 1350 units/hr -Daily CBC/HL -CABG on 7/30  Hildred Laser, Pharm D 09/20/2015 9:06 AM

## 2015-09-20 NOTE — Care Management Important Message (Signed)
Important Message  Patient Details  Name: [name] [name] MRN: [id number] Date of Birth: 10-04-1928   Medicare Important Message Given:  Yes    [name] 09/20/2015, 9:53 AM

## 2015-09-20 NOTE — Progress Notes (Signed)
Subjective:  Day 3 s/p NSTEMI; Feels better but again had recurrent chest pain earlier today.  Objective:   Vital Signs : Filed Vitals:   09/20/15 0504 09/20/15 0600 09/20/15 0700 09/20/15 0756  BP: 111/71 97/63 99/57  104/66  Pulse: 63 53 56 55  Temp:    98.7 F (37.1 C)  TempSrc:    Oral  Resp: 23 43 27 31  Height:      Weight:      SpO2: 98% 98% 97% 97%    Intake/Output from previous day:  Intake/Output Summary (Last 24 hours) at 09/20/15 0848 Last data filed at 09/20/15 0700  Gross per 24 hour  Intake   1831 ml  Output   2200 ml  Net   -369 ml    I/O since admission: +683  Wt Readings from Last 3 Encounters:  09/20/15 177 lb 4.8 oz (80.423 kg)  08/22/11 189 lb (85.73 kg)  07/31/11 190 lb (86.183 kg)    Medications: . amLODipine  2.5 mg Oral Daily  . aspirin  81 mg Oral Daily  . atorvastatin  80 mg Oral q1800  . cloNIDine  0.1 mg Oral BID  . levothyroxine  50 mcg Oral QAC breakfast  . metoprolol tartrate  12.5 mg Oral BID  . multivitamin  2 tablet Oral Daily  . pantoprazole  40 mg Oral Daily  . ranolazine  500 mg Oral BID  . sodium chloride flush  3 mL Intravenous Q12H  . spironolactone  12.5 mg Oral Daily    . sodium chloride 25 mL/hr at 09/20/15 0301  . heparin 1,350 Units/hr (09/20/15 0110)  . nitroGLYCERIN 40 mcg/min (09/19/15 1543)    Physical Exam:   General appearance: alert, cooperative and no distress Neck: no adenopathy, no carotid bruit, no JVD, supple, symmetrical, trachea midline and thyroid not enlarged, symmetric, no tenderness/mass/nodules Lungs: basilar rales/ decreased BS at bases Heart: regular rhtyhm HR 49-201; 1/6 systolic murmur, no S4 gallop.  No rubs thrills or heaves Abdomen: soft, non-tender; bowel sounds normal; no masses,  no organomegaly Extremities: no edema, redness or tenderness in the calves or thighs Pulses: 2+ and symmetric Skin: Skin color, texture, turgor normal. No rashes or lesions Neurologic: Grossly  normal   Rate: 64  Rhythm: NSR  ECG (independently read by me): SB at 56; 1st degree AV block; ST changes V4-6. I aVL.  6/27/17ECG (independently read by me): Sinus bradycardia at 50 with sinus arrythmia and PVC  09/18/15 ECG (independently read by me): Normal sinus rhythm at 74 bpm.  First-degree AV block.  Improvement in initial ST abnormalities from initial ECG of 625/17  09/17/15   ECG (independently read by me): Sinus rhythm at 93 bpm with PACs.  Diffuse global ST segment changes with 2 mm ST elevation in lead aVR  Lab Results:   Recent Labs  09/18/15 0339 09/19/15 0312 09/20/15 0245  NA 132* 132* 133*  K 4.0 3.9 4.3  CL 101 103 103  CO2 23 21* 22  GLUCOSE 119* 103* 99  BUN 11 15 16   CREATININE 0.97 1.00 1.14  CALCIUM 8.7* 8.5* 8.2*  MG 1.7  --   --     Hepatic Function Latest Ref Rng 09/18/2015 07/26/2011  Total Protein 6.5 - 8.1 g/dL 6.1(L) 7.1  Albumin 3.5 - 5.0 g/dL 3.4(L) 3.9  AST 15 - 41 U/L 31 14  ALT 17 - 63 U/L 13(L) 11  Alk Phosphatase 38 - 126 U/L 40 40  Total Bilirubin 0.3 -  1.2 mg/dL 0.5 0.4     Recent Labs  09/18/15 0339 09/19/15 0312 09/20/15 0245  WBC 8.6 13.6* 11.4*  HGB 12.5* 10.6* 10.6*  HCT 38.0* 32.4* 31.7*  MCV 87.4 87.3 88.8  PLT 249 233 247     Recent Labs  09/18/15 0339 09/18/15 1204 09/18/15 1500  TROPONINI 1.17* 2.59* 3.40*    Lab Results  Component Value Date   TSH 0.837 09/19/2015    Recent Labs  09/18/15 0339  HGBA1C 5.1     Recent Labs  09/18/15 0339  PROT 6.1*  ALBUMIN 3.4*  AST 31  ALT 13*  ALKPHOS 40  BILITOT 0.5    Recent Labs  09/18/15 0048  INR 1.31   BNP (last 3 results)  Recent Labs  09/18/15 0339 09/19/15 0312 09/20/15 0245  BNP 135.4* 356.6* 499.8*    ProBNP (last 3 results) No results for input(s): PROBNP in the last 8760 hours.   Lipid Panel     Component Value Date/Time   CHOL 164 09/18/2015 0339   TRIG 77 09/18/2015 0339   HDL 33* 09/18/2015 0339   CHOLHDL 5.0  09/18/2015 0339   VLDL 15 09/18/2015 0339   LDLCALC 116* 09/18/2015 0339      Imaging:  Ct Chest Wo Contrast  09/19/2015  CLINICAL DATA:  Chest pain, status post heart attack yesterday EXAM: CT CHEST WITHOUT CONTRAST TECHNIQUE: Multidetector CT imaging of the chest was performed following the standard protocol without IV contrast. COMPARISON:  Chest x-ray 09/17/2015 FINDINGS: Cardiovascular: Extensive atherosclerotic calcifications of ascending aorta. Mild atherosclerotic calcifications of descending aorta. No aortic aneurysm. Extensive atherosclerotic calcifications of coronary arteries. Heart size within normal limits. No pericardial effusion. Mediastinum/Nodes: There is a midline anterior mediastinal pre aortic lymph node measures 9 mm short-axis. A pre carinal lymph node measures 9 mm short-axis. AP window lymph node measures 9 mm short-axis. AV right pretracheal lymph node measures 1 cm short-axis. A hiatal hernia measures 6.6 cm in diameter. Central airways are patent. Images of the thoracic inlet are unremarkable. Sub- carinal lymph node Measures 1 cm short-axis. Lungs/Pleura: Images of the lung parenchyma shows no acute infiltrate or pulmonary edema. There is elevation of the right hemidiaphragm. There is no segmental infiltrate or pulmonary edema. Minimal atelectasis is noted in right upper lobe centrally. No pleural thickening or pleural effusion. No focal consolidation. There is no pneumothorax. Minimal atelectasis or scarring in lingula. Upper Abdomen: The visualized upper abdomen shows no adrenal gland mass. No intrahepatic biliary ductal dilatation. Visualized upper kidneys are unremarkable. Atherosclerotic calcifications of abdominal aorta and celiac trunk origin. Few colonic diverticula are noted right colon and descending colon. Musculoskeletal: No destructive bony lesions are noted. Sagittal images of the spine shows mild degenerative changes thoracic spine. Sagittal view of the sternum  is unremarkable. IMPRESSION: 1. There is no evidence of acute infiltrate or pleural effusion. 2. Borderline enlarged mediastinal lymph nodes probable reactive. 3. Moderate size hiatal hernia measures 6.6 cm. 4. There is elevation of the right hemidiaphragm. 5. Mild degenerative changes thoracic spine. 6. Extensive atherosclerotic calcifications of ascending aorta and coronary arteries. Electronically Signed   By: Lahoma Crocker M.D.   On: 09/19/2015 12:12    Conclusion     Ost LAD lesion, 90% stenosed.  Mid LAD lesion, 90% stenosed.  2nd Diag lesion, 99% stenosed.  Dist LAD-1 lesion, 80% stenosed.  Dist LAD-2 lesion, 90% stenosed.  Ost Cx to Prox Cx lesion, 70% stenosed.  Mid Cx lesion, 100% stenosed.  Mid  RCA lesion, 80% stenosed.  2nd RPLB lesion, 99% stenosed.  Dist RCA lesion, 80% stenosed.  There is severe left ventricular systolic dysfunction.  Prox RCA lesion, 40% stenosed.   Severe diffuse multivessel CAD with ostial 90% LAD stenosis, diffuse 90% proximal to mid and 108 and 80% LAD stenoses with subtotal 99% stenosis in the second diagonal vessel; total occlusion of the proximal circumflex with collateral supplying the distal circumflex; and diffusely irregular RCA with 80% stenosis in the region of the acute margin and 99% PLA stenosis with left-to-right collaterals.  Severe global LV dysfunction compatible with an ischemic cardiomyopathy with diffuse hypokinesis and an ejection fraction of 25-30%. LVEDP 12 mm Hg  Dilated aortic root and ascending aorta.  RECOMMENDATION: Surgical consultation will be obtained for consideration of CABG revascularization surgery.     Indications    ST elevation myocardial infarction (STEMI), unspecified artery ([place]) [I21.3 (ICD-10-CM)]    Technique and Indications    [name] is an 82 year old Caucasian male who developed significant chest pain 2 days ago. He developed recurrent chest pain this evening and presented to  [facility] emergency room. His ECG revealed diffuse global ST-wave abnormalities with 2 mm of ST elevation in lead aVR. He was given IV heparin and supplemental nitroglycerin and started on IV nitroglycerin drip. A code STEMI was activated and he was transported to the [facility] catheterization laboratory for emergent cardiac catheterization.  Upon arrival to the laboratory the patient chest pain was improved but still had mild 1-2/10 chest pain. With a history of dye allergy he was pretreated with Solu-Medrol, Pepcid, and Benadryl. He received Versed 1 mg. His right femoral artery was punctured anteriorly and a 6 French sheath was inserted without difficulty. Diagnostic catheterization was done with 5 [place] JL 5 and JR4 diagnostic catheters. A 5 French pigtail catheter was used for left ventriculography. The patient was on an IV nitroglycerin drip and wiith his severe disease, his nitro drip was increased. An ACT was checked and his sheath was pulled with plans for resumption of heparin and surgical consultation for CABG surgery in the morning. He left the catheterization laboratory with stable hemodynamics chest pain-free and was transported to the coronary care unit. During this procedure the patient was administered a total of Versed 1 mg to achieve and maintain moderate conscious sedation. The patient's heart rate, blood pressure, and oxygen saturation were monitored continuously during the procedure. The period of conscious sedation was 27 minutes, of which I was present face-to-face 100% of this time. Estimated blood loss <50 mL. There were no immediate complications during the procedure.    Coronary Findings    Dominance: Right   Left Main  The left main coronary artery was a large-caliber vessel which bifurcated into the LAD and left circumflex vessel     Left Anterior Descending  The LAD was diffusely diseased. There was diffuse 90% ostial stenosis proximal to the first diagonal vessel. There  were segmental stenoses of 90, 80, 70 and 90 throughout the entire LAD. The second diagonal vessel was subtotally occluded in its midsegment   . Ost LAD lesion, 90% stenosed.   . Mid LAD lesion, 90% stenosed.   . Dist LAD-1 lesion, 80% stenosed.   . Dist LAD-2 lesion, 90% stenosed.   . Second Diagonal Branch   . 2nd Diag lesion, 99% stenosed.     Left Circumflex  E left circumflex vessel is totally occluded proximally. There was collateralization of the distal circumflex. The the left  coronary system   . Ost Cx to Prox Cx lesion, 70% stenosed.   . Mid Cx lesion, 100% stenosed.   . Third Obtuse Marginal Branch   3rd Mrg filled by collaterals from 3rd Sept.     Right Coronary Artery  RCA had diffuse irregularity and 40% proximal stenoses. There was 80% stenosis in the region of the acute margin. The vessel supply the PDA. Then the posterior lateral vessel. There was diffuse 99% stenosis in this posterior lateral vessel with retrograde filling of this be the distal LAD.   Marland Kitchen Prox RCA lesion, 40% stenosed.   . Mid RCA lesion, 80% stenosed.   . Dist RCA lesion, 80% stenosed.   . First Right Posterolateral   The vessel is small in size.   Marland Kitchen Second Right Posterolateral   2nd RPLB filled by collaterals from Dist LAD.   . 2nd RPLB lesion, 99% stenosed.      Wall Motion                 Left Heart    Left Ventricle The left ventricle is enlarged. There is severe left ventricular systolic dysfunction. The left ventricular ejection fraction is 25-35% by visual estimate. There are wall motion abnormalities in the left ventricle. Severe global LV dysfunction with an ejection fraction at 25-30%. LVEDP is 12 mmHg.   Aorta The aortic root and ascending aorta is dilated.    Coronary Diagrams    Diagnostic Diagram           ------------------------------------------------------------------- ECHO Study Conclusions  - Left ventricle: There appears to be hypokinesis in the basal  and  mid inferolateral and anterolateral walls. The cavity size was  normal. There was mild focal basal hypertrophy of the septum.  Systolic function was normal. The estimated ejection fraction was  in the range of 50% to 55%. Wall motion was normal; there were no  regional wall motion abnormalities. The study is not technically  sufficient to allow evaluation of LV diastolic function. - Aortic valve: There was mild regurgitation. - Mitral valve: There was mild regurgitation. - Left atrium: The atrium was mildly dilated. - Right ventricle: The cavity size was normal. Wall thickness was  normal. Systolic function was normal. - Right atrium: The atrium was normal in size. - Pulmonary arteries: Systolic pressure was within the normal  range. - Inferior vena cava: The vessel was normal in size. - Pericardium, extracardiac: There was no pericardial effusion.  -------------------------------------------------------------------------------------------- CHEST CT FINDINGS: Cardiovascular: Extensive atherosclerotic calcifications of ascending aorta. Mild atherosclerotic calcifications of descending aorta. No aortic aneurysm. Extensive atherosclerotic calcifications of coronary arteries. Heart size within normal limits. No pericardial effusion.  Mediastinum/Nodes: There is a midline anterior mediastinal pre aortic lymph node measures 9 mm short-axis. A pre carinal lymph node measures 9 mm short-axis. AP window lymph node measures 9 mm short-axis. AV right pretracheal lymph node measures 1 cm short-axis.  A hiatal hernia measures 6.6 cm in diameter. Central airways are patent. Images of the thoracic inlet are unremarkable. Sub- carinal lymph node Measures 1 cm short-axis.  Lungs/Pleura: Images of the lung parenchyma shows no acute infiltrate or pulmonary edema. There is elevation of the right hemidiaphragm. There is no segmental infiltrate or pulmonary edema. Minimal  atelectasis is noted in right upper lobe centrally. No pleural thickening or pleural effusion. No focal consolidation. There is no pneumothorax. Minimal atelectasis or scarring in lingula.  Upper Abdomen: The visualized upper abdomen shows no adrenal gland mass. No intrahepatic biliary  ductal dilatation. Visualized upper kidneys are unremarkable. Atherosclerotic calcifications of abdominal aorta and celiac trunk origin. Few colonic diverticula are noted right colon and descending colon.  Musculoskeletal: No destructive bony lesions are noted. Sagittal images of the spine shows mild degenerative changes thoracic spine. Sagittal view of the sternum is unremarkable.  IMPRESSION: 1. There is no evidence of acute infiltrate or pleural effusion. 2. Borderline enlarged mediastinal lymph nodes probable reactive. 3. Moderate size hiatal hernia measures 6.6 cm. 4. There is elevation of the right hemidiaphragm. 5. Mild degenerative changes thoracic spine. 6. Extensive atherosclerotic calcifications of ascending aorta and coronary arteries.   Assessment/Plan:   Principal Problem:   Non-STEMI (non-ST elevated myocardial infarction) ([place]) Active Problems:   Hypertension   GERD (gastroesophageal reflux disease)   NSTEMI (non-ST elevated myocardial infarction) ([facility])   ST elevation myocardial infarction (STEMI) ([facility])   ST elevation (STEMI) myocardial infarction of unspecified site ([place])   1. Acute coronary syndrome with severe multivessel CAD. Vessels are not suitable for PCI; Plan for high risk CABG this Friday 6/30. 2. Dilated aortic root and ascending aorta;  Ascending aorta 47 mm on echo;  No aneurysym on CT. 3.  Ischemic cardiomyopathy with EF 25-30% at cath 4.  Essential hypertension 5: Hyperlipidemia: LDL 116; on atorvastatin 80 mg. 5. GERD 6. H/o prostate Ca 7. 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    Tolerating   low-dose metoprolol at 12.5 mg twice a day and will keep at present dose today with bradycardia on ECG. Tolerating the addition of amlodipine and ranexa added yesterday.  CT reviewed. No aortic aneurysm. Plan CABG on Friday; will continue IV NTG and heparin until CABG.  Time spent: 35 minutes   Troy Sine, MD, [facility] 09/20/2015, 8:48 AM

## 2015-09-21 ENCOUNTER — Inpatient Hospital Stay (HOSPITAL_COMMUNITY): Payer: Medicare HMO

## 2015-09-21 ENCOUNTER — Encounter (HOSPITAL_COMMUNITY)
Admission: EM | Disposition: A | Discharge status: Home Health | Payer: Self-pay | Source: Home / Self Care | Attending: Cardiothoracic Surgery

## 2015-09-21 ENCOUNTER — Encounter (HOSPITAL_COMMUNITY): Payer: Self-pay | Admitting: Cardiology

## 2015-09-21 DIAGNOSIS — I237 Postinfarction angina: Secondary | ICD-10-CM

## 2015-09-21 DIAGNOSIS — I200 Unstable angina: Secondary | ICD-10-CM

## 2015-09-21 DIAGNOSIS — I2510 Atherosclerotic heart disease of native coronary artery without angina pectoris: Secondary | ICD-10-CM | POA: Insufficient documentation

## 2015-09-21 HISTORY — PX: CARDIAC CATHETERIZATION: SHX172

## 2015-09-21 LAB — BASIC METABOLIC PANEL
Anion gap: 7 (ref 5–15)
BUN: 12 mg/dL (ref 6–20)
CHLORIDE: 102 mmol/L (ref 101–111)
CO2: 23 mmol/L (ref 22–32)
Calcium: 8.2 mg/dL — ABNORMAL LOW (ref 8.9–10.3)
Creatinine, Ser: 0.87 mg/dL (ref 0.61–1.24)
GFR calc Af Amer: >60 mL/min (ref >60)
GFR calc non Af Amer: >60 mL/min (ref >60)
Glucose, Bld: 117 mg/dL — ABNORMAL HIGH (ref 65–99)
POTASSIUM: 3.7 mmol/L (ref 3.5–5.1)
SODIUM: 132 mmol/L — AB (ref 135–145)

## 2015-09-21 LAB — CBC
HCT: 33.1 % — ABNORMAL LOW (ref 39.0–52.0)
HEMOGLOBIN: 11 g/dL — AB (ref 13.0–17.0)
MCH: 29.6 pg (ref 26.0–34.0)
MCHC: 33.2 g/dL (ref 30.0–36.0)
MCV: 89.2 fL (ref 78.0–100.0)
Platelets: 225 10*3/uL (ref 150–400)
RBC: 3.71 MIL/uL — AB (ref 4.22–5.81)
RDW: 13.4 % (ref 11.5–15.5)
WBC: 10.6 10*3/uL — ABNORMAL HIGH (ref 4.0–10.5)

## 2015-09-21 LAB — HEPARIN LEVEL (UNFRACTIONATED)
Heparin Unfractionated: 0.29 IU/mL — ABNORMAL LOW (ref 0.30–0.70)
Heparin Unfractionated: 0.28 IU/mL — ABNORMAL LOW (ref 0.30–0.70)

## 2015-09-21 SURGERY — IABP INSERTION

## 2015-09-21 MED ORDER — SODIUM CHLORIDE 0.9% FLUSH: 3.0000 mL | INTRAVENOUS | Status: DC | PRN

## 2015-09-21 MED ORDER — DOPAMINE-DEXTROSE 3.2-5 MG/ML-% IV SOLN
0.0000 ug/kg/min | INTRAVENOUS | Status: AC
  Administered 2015-09-22: 2.5 ug/kg/min via INTRAVENOUS
  Filled 2015-09-21: qty 250

## 2015-09-21 MED ORDER — LIDOCAINE HCL (PF) 1 % IJ SOLN
INTRAMUSCULAR | Status: DC | PRN
  Administered 2015-09-21: 20 mL via SUBCUTANEOUS

## 2015-09-21 MED ORDER — HEPARIN (PORCINE) IN NACL 2-0.9 UNIT/ML-% IJ SOLN
INTRAMUSCULAR | Status: DC | PRN
  Administered 2015-09-21: 1000 mL

## 2015-09-21 MED ORDER — LIDOCAINE HCL (PF) 1 % IJ SOLN
INTRAMUSCULAR | Status: AC
  Filled 2015-09-21: qty 30

## 2015-09-21 MED ORDER — BISACODYL 5 MG PO TBEC
5.0000 mg | DELAYED_RELEASE_TABLET | Freq: Once | ORAL | Status: AC
Start: 2015-09-21 — End: 2015-09-21
  Administered 2015-09-21: 5 mg via ORAL
  Filled 2015-09-21: qty 1

## 2015-09-21 MED ORDER — EPINEPHRINE HCL 1 MG/ML IJ SOLN
0.0000 ug/min | INTRAVENOUS | Status: DC
  Filled 2015-09-21: qty 4

## 2015-09-21 MED ORDER — MIDAZOLAM HCL 2 MG/2ML IJ SOLN
INTRAMUSCULAR | Status: DC | PRN
  Administered 2015-09-21: 1 mg via INTRAVENOUS

## 2015-09-21 MED ORDER — CHLORHEXIDINE GLUCONATE CLOTH 2 % EX PADS: 6.0000 | MEDICATED_PAD | Freq: Once | CUTANEOUS | Status: DC

## 2015-09-21 MED ORDER — POTASSIUM CHLORIDE 2 MEQ/ML IV SOLN
80.0000 meq | INTRAVENOUS | Status: DC
  Filled 2015-09-21: qty 40

## 2015-09-21 MED ORDER — SODIUM CHLORIDE 0.9 % IV SOLN: 250.0000 mL | INTRAVENOUS | Status: DC | PRN

## 2015-09-21 MED ORDER — SODIUM CHLORIDE 0.9 % IV SOLN: Freq: Once | INTRAVENOUS | Status: DC

## 2015-09-21 MED ORDER — PLASMA-LYTE 148 IV SOLN
INTRAVENOUS | Status: AC
  Administered 2015-09-22: 500 mL
  Filled 2015-09-21: qty 2.5

## 2015-09-21 MED ORDER — METOPROLOL TARTRATE 12.5 MG HALF TABLET: 12.5000 mg | ORAL_TABLET | Freq: Once | ORAL | Status: DC

## 2015-09-21 MED ORDER — MIDAZOLAM HCL 2 MG/2ML IJ SOLN
INTRAMUSCULAR | Status: AC
  Filled 2015-09-21: qty 2

## 2015-09-21 MED ORDER — NITROGLYCERIN IN D5W 200-5 MCG/ML-% IV SOLN
2.0000 ug/min | INTRAVENOUS | Status: DC
  Filled 2015-09-21: qty 250

## 2015-09-21 MED ORDER — SODIUM CHLORIDE 0.9% FLUSH: 3.0000 mL | Freq: Two times a day (BID) | INTRAVENOUS | Status: DC

## 2015-09-21 MED ORDER — METOPROLOL TARTRATE 12.5 MG HALF TABLET
12.5000 mg | ORAL_TABLET | Freq: Once | ORAL | Status: AC
  Administered 2015-09-22: 12.5 mg via ORAL
  Filled 2015-09-21: qty 1

## 2015-09-21 MED ORDER — FENTANYL CITRATE (PF) 100 MCG/2ML IJ SOLN
INTRAMUSCULAR | Status: DC | PRN
  Administered 2015-09-21: 25 ug via INTRAVENOUS

## 2015-09-21 MED ORDER — SODIUM CHLORIDE 0.9 % IV SOLN
INTRAVENOUS | Status: AC
  Administered 2015-09-22: 69.8 mL/h via INTRAVENOUS
  Filled 2015-09-21: qty 40

## 2015-09-21 MED ORDER — SODIUM CHLORIDE 0.9 % IV SOLN
INTRAVENOUS | Status: DC
  Filled 2015-09-21: qty 30

## 2015-09-21 MED ORDER — CHLORHEXIDINE GLUCONATE CLOTH 2 % EX PADS
6.0000 | MEDICATED_PAD | Freq: Once | CUTANEOUS | Status: AC
Start: 2015-09-21 — End: 2015-09-22
  Administered 2015-09-22: 6 via TOPICAL

## 2015-09-21 MED ORDER — CHLORHEXIDINE GLUCONATE 0.12 % MT SOLN: 15.0000 mL | Freq: Once | OROMUCOSAL | Status: DC

## 2015-09-21 MED ORDER — SODIUM CHLORIDE 0.9 % IV SOLN
INTRAVENOUS | Status: AC
  Administered 2015-09-22: 1.3 [IU]/h via INTRAVENOUS
  Filled 2015-09-21: qty 2.5

## 2015-09-21 MED ORDER — BISACODYL 5 MG PO TBEC: 5.0000 mg | DELAYED_RELEASE_TABLET | Freq: Once | ORAL | Status: DC

## 2015-09-21 MED ORDER — SODIUM CHLORIDE 0.9% FLUSH
3.0000 mL | Freq: Two times a day (BID) | INTRAVENOUS | Status: DC
Start: 2015-09-21 — End: 2015-09-21

## 2015-09-21 MED ORDER — TEMAZEPAM 15 MG PO CAPS: 15.0000 mg | ORAL_CAPSULE | Freq: Once | ORAL | Status: DC | PRN

## 2015-09-21 MED ORDER — FENTANYL CITRATE (PF) 100 MCG/2ML IJ SOLN
INTRAMUSCULAR | Status: AC
  Filled 2015-09-21: qty 2

## 2015-09-21 MED ORDER — SODIUM CHLORIDE 0.9 % IV SOLN
INTRAVENOUS | Status: DC
  Administered 2015-09-21: 19:00:00 via INTRAVENOUS

## 2015-09-21 MED ORDER — CHLORHEXIDINE GLUCONATE CLOTH 2 % EX PADS
6.0000 | MEDICATED_PAD | Freq: Once | CUTANEOUS | Status: AC
  Administered 2015-09-21: 6 via TOPICAL

## 2015-09-21 MED ORDER — VANCOMYCIN HCL 10 G IV SOLR
1250.0000 mg | INTRAVENOUS | Status: AC
  Administered 2015-09-22: 1250 mg via INTRAVENOUS
  Filled 2015-09-21: qty 1250

## 2015-09-21 MED ORDER — DEXMEDETOMIDINE HCL IN NACL 400 MCG/100ML IV SOLN
0.1000 ug/kg/h | INTRAVENOUS | Status: AC
  Administered 2015-09-22: .4 ug/kg/h via INTRAVENOUS
  Filled 2015-09-21: qty 100

## 2015-09-21 MED ORDER — TEMAZEPAM 7.5 MG PO CAPS
15.0000 mg | ORAL_CAPSULE | Freq: Once | ORAL | Status: AC | PRN
  Administered 2015-09-21: 15 mg via ORAL
  Filled 2015-09-21: qty 2

## 2015-09-21 MED ORDER — DEXTROSE 5 % IV SOLN
1.5000 g | INTRAVENOUS | Status: AC
  Administered 2015-09-22: 1.5 g via INTRAVENOUS
  Administered 2015-09-22: .75 g via INTRAVENOUS
  Filled 2015-09-21: qty 1.5

## 2015-09-21 MED ORDER — DEXTROSE 5 % IV SOLN
30.0000 ug/min | INTRAVENOUS | Status: AC
  Administered 2015-09-22: 25 ug/min via INTRAVENOUS
  Filled 2015-09-21: qty 2

## 2015-09-21 MED ORDER — DEXTROSE 5 % IV SOLN
750.0000 mg | INTRAVENOUS | Status: DC
  Filled 2015-09-21: qty 750

## 2015-09-21 MED ORDER — HEPARIN (PORCINE) IN NACL 2-0.9 UNIT/ML-% IJ SOLN
INTRAMUSCULAR | Status: AC
  Filled 2015-09-21: qty 1000

## 2015-09-21 MED ORDER — CHLORHEXIDINE GLUCONATE 0.12 % MT SOLN
15.0000 mL | Freq: Once | OROMUCOSAL | Status: AC
  Administered 2015-09-22: 15 mL via OROMUCOSAL
  Filled 2015-09-21: qty 15

## 2015-09-21 MED ORDER — ASPIRIN 81 MG PO CHEW: 81.0000 mg | CHEWABLE_TABLET | ORAL | Status: DC

## 2015-09-21 MED ORDER — MAGNESIUM SULFATE 50 % IJ SOLN
40.0000 meq | INTRAMUSCULAR | Status: DC
  Filled 2015-09-21: qty 10

## 2015-09-21 SURGICAL SUPPLY — 7 items
BALLN LINEAR 7.5FR IABP 40CC (BALLOONS) ×3
BALLOON LINEAR 7.5FR IABP 40CC (BALLOONS) IMPLANT
DEVICE SECURE STATLOCK IABP (MISCELLANEOUS) ×4 IMPLANT
ELECT DEFIB PAD ADLT CADENCE (PAD) ×2 IMPLANT
PACK CARDIAC CATHETERIZATION (CUSTOM PROCEDURE TRAY) ×2 IMPLANT
PROTECTION STATION PRESSURIZED (MISCELLANEOUS) ×3
STATION PROTECTION PRESSURIZED (MISCELLANEOUS) IMPLANT

## 2015-09-21 NOTE — Interval H&P Note (Signed)
History and Physical Interval Note:  09/21/2015 10:15 AM  [name] [name]  has presented today for surgery, with the diagnosis of pre cabg - s/p STEMI with post-infarction Angina.   The various methods of treatment have been discussed with the patient and family. After consideration of risks, benefits and other options for treatment, the patient has consented to  Procedure(s): IABP Insertion (N/A) as a surgical intervention .  The patient's history has been reviewed, patient examined, no change in status, stable for surgery.  I have reviewed the patient's chart and labs.  Questions were answered to the patient's satisfaction.     Glenetta Hew

## 2015-09-21 NOTE — Progress Notes (Signed)
Subjective:  Day 4 s/p NSTEMI; Recurrent chest pain last night on iv NTG/heparin; currently pain free.  Objective:   Vital Signs : Filed Vitals:   09/21/15 0500 09/21/15 0600 09/21/15 0700 09/21/15 0800  BP: 96/54 95/51 109/60 116/68  Pulse: 57 54 59   Temp:    98.1 F (36.7 C)  TempSrc:      Resp: 36 41 21   Height:      Weight:      SpO2: 99% 99% 99%     Intake/Output from previous day:  Intake/Output Summary (Last 24 hours) at 09/21/15 0919 Last data filed at 09/21/15 0800  Gross per 24 hour  Intake 1439.07 ml  Output   2250 ml  Net -810.93 ml    I/O since admission: +273  Wt Readings from Last 3 Encounters:  09/21/15 179 lb 0.2 oz (81.2 kg)  08/22/11 189 lb (85.73 kg)  07/31/11 190 lb (86.183 kg)    Medications: . amLODipine  2.5 mg Oral Daily  . antiseptic oral rinse  7 mL Mouth Rinse BID  . aspirin  81 mg Oral Daily  . atorvastatin  80 mg Oral q1800  . bisacodyl  5 mg Oral Once  . [START ON 09/22/2015] chlorhexidine  15 mL Mouth/Throat Once  . Chlorhexidine Gluconate Cloth  6 each Topical Once   And  . Chlorhexidine Gluconate Cloth  6 each Topical Once  . cloNIDine  0.1 mg Oral BID  . levothyroxine  50 mcg Oral QAC breakfast  . metoprolol tartrate  12.5 mg Oral BID  . [START ON 09/22/2015] metoprolol tartrate  12.5 mg Oral Once  . multivitamin  2 tablet Oral Daily  . pantoprazole  40 mg Oral Daily  . ranolazine  500 mg Oral BID  . sodium chloride flush  3 mL Intravenous Q12H  . spironolactone  12.5 mg Oral Daily    . sodium chloride 25 mL/hr at 09/21/15 0800  . heparin 1,450 Units/hr (09/21/15 0800)  . nitroGLYCERIN 50 mcg/min (09/21/15 0800)    Physical Exam:   General appearance: alert, cooperative and no distress Neck: no adenopathy, no carotid bruit, no JVD, supple, symmetrical, trachea midline and thyroid not enlarged, symmetric, no tenderness/mass/nodules Lungs: basilar rales/ decreased BS at bases Heart: regular rhtyhm HR 76-160; 1/6  systolic murmur, no S4 gallop.  No rubs thrills or heaves Abdomen: soft, non-tender; bowel sounds normal; no masses,  no organomegaly Extremities: no edema, redness or tenderness in the calves or thighs Pulses: 2+ and symmetric Skin: Skin color, texture, turgor normal. No rashes or lesions Neurologic: Grossly normal   Rate: 64  Rhythm: NSR  09/21/15 ECG (independently read by me): SB at 59; 1st degree AV block; lateral STT changes  09/20/15 ECG (independently read by me): SB at 56; 1st degree AV block; ST changes V4-6. I aVL.  6/27/17ECG (independently read by me): Sinus bradycardia at 50 with sinus arrythmia and PVC  09/18/15 ECG (independently read by me): Normal sinus rhythm at 74 bpm.  First-degree AV block.  Improvement in initial ST abnormalities from initial ECG of 625/17  09/17/15   ECG (independently read by me): Sinus rhythm at 93 bpm with PACs.  Diffuse global ST segment changes with 2 mm ST elevation in lead aVR  Lab Results:   Recent Labs  09/19/15 0312 09/20/15 0245 09/21/15 0400  NA 132* 133* 132*  K 3.9 4.3 3.7  CL 103 103 102  CO2 21* 22 23  GLUCOSE 103* 99 117*  BUN _0 CREATININE 1.00 1.14 0.87  CALCIUM 8.5* 8.2* 8.2*    Hepatic Function Latest Ref Rng 09/18/2015 07/26/2011  Total Protein 6.5 - 8.1 g/dL 6.1(L) 7.1  Albumin 3.5 - 5.0 g/dL 3.4(L) 3.9  AST 15 - 41 U/L 31 14  ALT 17 - 63 U/L 13(L) 11  Alk Phosphatase 38 - 126 U/L 40 40  Total Bilirubin 0.3 - 1.2 mg/dL 0.5 0.4     Recent Labs  09/19/15 0312 09/20/15 0245 09/21/15 0400  WBC 13.6* 11.4* 10.6*  HGB 10.6* 10.6* 11.0*  HCT 32.4* 31.7* 33.1*  MCV 87.3 88.8 89.2  PLT 233 247 225     Recent Labs  09/18/15 1204 09/18/15 1500  TROPONINI 2.59* 3.40*    Lab Results  Component Value Date   TSH 0.837 09/19/2015   No results for input(s): HGBA1C in the last 72 hours.  No results for input(s): PROT, ALBUMIN, AST, ALT, ALKPHOS, BILITOT, BILIDIR, IBILI in the last 72 hours. No  results for input(s): INR in the last 72 hours. BNP (last 3 results)  Recent Labs  09/18/15 0339 09/19/15 0312 09/20/15 0245  BNP 135.4* 356.6* 499.8*    ProBNP (last 3 results) No results for input(s): PROBNP in the last 8760 hours.   Lipid Panel     Component Value Date/Time   CHOL 164 09/18/2015 0339   TRIG 77 09/18/2015 0339   HDL 33* 09/18/2015 0339   CHOLHDL 5.0 09/18/2015 0339   VLDL 15 09/18/2015 0339   LDLCALC 116* 09/18/2015 0339      Imaging:  Ct Chest Wo Contrast  09/19/2015  CLINICAL DATA:  Chest pain, status post heart attack yesterday EXAM: CT CHEST WITHOUT CONTRAST TECHNIQUE: Multidetector CT imaging of the chest was performed following the standard protocol without IV contrast. COMPARISON:  Chest x-ray 09/17/2015 FINDINGS: Cardiovascular: Extensive atherosclerotic calcifications of ascending aorta. Mild atherosclerotic calcifications of descending aorta. No aortic aneurysm. Extensive atherosclerotic calcifications of coronary arteries. Heart size within normal limits. No pericardial effusion. Mediastinum/Nodes: There is a midline anterior mediastinal pre aortic lymph node measures 9 mm short-axis. A pre carinal lymph node measures 9 mm short-axis. AP window lymph node measures 9 mm short-axis. AV right pretracheal lymph node measures 1 cm short-axis. A hiatal hernia measures 6.6 cm in diameter. Central airways are patent. Images of the thoracic inlet are unremarkable. Sub- carinal lymph node Measures 1 cm short-axis. Lungs/Pleura: Images of the lung parenchyma shows no acute infiltrate or pulmonary edema. There is elevation of the right hemidiaphragm. There is no segmental infiltrate or pulmonary edema. Minimal atelectasis is noted in right upper lobe centrally. No pleural thickening or pleural effusion. No focal consolidation. There is no pneumothorax. Minimal atelectasis or scarring in lingula. Upper Abdomen: The visualized upper abdomen shows no adrenal gland mass.  No intrahepatic biliary ductal dilatation. Visualized upper kidneys are unremarkable. Atherosclerotic calcifications of abdominal aorta and celiac trunk origin. Few colonic diverticula are noted right colon and descending colon. Musculoskeletal: No destructive bony lesions are noted. Sagittal images of the spine shows mild degenerative changes thoracic spine. Sagittal view of the sternum is unremarkable. IMPRESSION: 1. There is no evidence of acute infiltrate or pleural effusion. 2. Borderline enlarged mediastinal lymph nodes probable reactive. 3. Moderate size hiatal hernia measures 6.6 cm. 4. There is elevation of the right hemidiaphragm. 5. Mild degenerative changes thoracic spine. 6. Extensive atherosclerotic calcifications of ascending aorta and coronary arteries. Electronically Signed   By: Orlean Bradford.D.  On: 09/19/2015 12:12   Dg Chest Port 1 View  09/21/2015  CLINICAL DATA:  Shortness of breath on exertion EXAM: PORTABLE CHEST 1 VIEW COMPARISON:  09/19/2015 FINDINGS: Cardiac shadow is within normal limits. Patchy new airspace opacities are noted bilaterally. No sizable effusion is noted. No bony abnormality is noted. IMPRESSION: New bilateral patchy infiltrates. Electronically Signed   By: Inez Catalina M.D.   On: 09/21/2015 07:30    Conclusion     Ost LAD lesion, 90% stenosed.  Mid LAD lesion, 90% stenosed.  2nd Diag lesion, 99% stenosed.  Dist LAD-1 lesion, 80% stenosed.  Dist LAD-2 lesion, 90% stenosed.  Ost Cx to Prox Cx lesion, 70% stenosed.  Mid Cx lesion, 100% stenosed.  Mid RCA lesion, 80% stenosed.  2nd RPLB lesion, 99% stenosed.  Dist RCA lesion, 80% stenosed.  There is severe left ventricular systolic dysfunction.  Prox RCA lesion, 40% stenosed.   Severe diffuse multivessel CAD with ostial 90% LAD stenosis, diffuse 90% proximal to mid and 72 and 80% LAD stenoses with subtotal 99% stenosis in the second diagonal vessel; total occlusion of the proximal circumflex  with collateral supplying the distal circumflex; and diffusely irregular RCA with 80% stenosis in the region of the acute margin and 99% PLA stenosis with left-to-right collaterals.  Severe global LV dysfunction compatible with an ischemic cardiomyopathy with diffuse hypokinesis and an ejection fraction of 25-30%. LVEDP 12 mm Hg  Dilated aortic root and ascending aorta.  RECOMMENDATION: Surgical consultation will be obtained for consideration of CABG revascularization surgery.     Indications    ST elevation myocardial infarction (STEMI), unspecified artery ([place]) [I21.3 (ICD-10-CM)]    Technique and Indications    Mr. Torien Ramroop is an 82 year old Caucasian male who developed significant chest pain 2 days ago. He developed recurrent chest pain this evening and presented to [facility] emergency room. His ECG revealed diffuse global ST-wave abnormalities with 2 mm of ST elevation in lead aVR. He was given IV heparin and supplemental nitroglycerin and started on IV nitroglycerin drip. A code STEMI was activated and he was transported to the [facility] catheterization laboratory for emergent cardiac catheterization.  Upon arrival to the laboratory the patient chest pain was improved but still had mild 1-2/10 chest pain. With a history of dye allergy he was pretreated with Solu-Medrol, Pepcid, and Benadryl. He received Versed 1 mg. His right femoral artery was punctured anteriorly and a 6 French sheath was inserted without difficulty. Diagnostic catheterization was done with 5 [place] JL 5 and JR4 diagnostic catheters. A 5 French pigtail catheter was used for left ventriculography. The patient was on an IV nitroglycerin drip and wiith his severe disease, his nitro drip was increased. An ACT was checked and his sheath was pulled with plans for resumption of heparin and surgical consultation for CABG surgery in the morning. He left the catheterization laboratory with stable hemodynamics chest pain-free  and was transported to the coronary care unit. During this procedure the patient was administered a total of Versed 1 mg to achieve and maintain moderate conscious sedation. The patient's heart rate, blood pressure, and oxygen saturation were monitored continuously during the procedure. The period of conscious sedation was 27 minutes, of which I was present face-to-face 100% of this time. Estimated blood loss <50 mL. There were no immediate complications during the procedure.    Coronary Findings    Dominance: Right   Left Main  The left main coronary artery was a large-caliber vessel which bifurcated into  the LAD and left circumflex vessel     Left Anterior Descending  The LAD was diffusely diseased. There was diffuse 90% ostial stenosis proximal to the first diagonal vessel. There were segmental stenoses of 90, 80, 70 and 90 throughout the entire LAD. The second diagonal vessel was subtotally occluded in its midsegment   . Ost LAD lesion, 90% stenosed.   . Mid LAD lesion, 90% stenosed.   . Dist LAD-1 lesion, 80% stenosed.   . Dist LAD-2 lesion, 90% stenosed.   . Second Diagonal Branch   . 2nd Diag lesion, 99% stenosed.     Left Circumflex  E left circumflex vessel is totally occluded proximally. There was collateralization of the distal circumflex. The the left coronary system   . Ost Cx to Prox Cx lesion, 70% stenosed.   . Mid Cx lesion, 100% stenosed.   . Third Obtuse Marginal Branch   3rd Mrg filled by collaterals from 3rd Sept.     Right Coronary Artery  RCA had diffuse irregularity and 40% proximal stenoses. There was 80% stenosis in the region of the acute margin. The vessel supply the PDA. Then the posterior lateral vessel. There was diffuse 99% stenosis in this posterior lateral vessel with retrograde filling of this be the distal LAD.   Marland Kitchen Prox RCA lesion, 40% stenosed.   . Mid RCA lesion, 80% stenosed.   . Dist RCA lesion, 80% stenosed.   . First Right Posterolateral    The vessel is small in size.   Marland Kitchen Second Right Posterolateral   2nd RPLB filled by collaterals from Dist LAD.   . 2nd RPLB lesion, 99% stenosed.      Wall Motion                 Left Heart    Left Ventricle The left ventricle is enlarged. There is severe left ventricular systolic dysfunction. The left ventricular ejection fraction is 25-35% by visual estimate. There are wall motion abnormalities in the left ventricle. Severe global LV dysfunction with an ejection fraction at 25-30%. LVEDP is 12 mmHg.   Aorta The aortic root and ascending aorta is dilated.    Coronary Diagrams    Diagnostic Diagram           ------------------------------------------------------------------- ECHO Study Conclusions  - Left ventricle: There appears to be hypokinesis in the basal and  mid inferolateral and anterolateral walls. The cavity size was  normal. There was mild focal basal hypertrophy of the septum.  Systolic function was normal. The estimated ejection fraction was  in the range of 50% to 55%. Wall motion was normal; there were no  regional wall motion abnormalities. The study is not technically  sufficient to allow evaluation of LV diastolic function. - Aortic valve: There was mild regurgitation. - Mitral valve: There was mild regurgitation. - Left atrium: The atrium was mildly dilated. - Right ventricle: The cavity size was normal. Wall thickness was  normal. Systolic function was normal. - Right atrium: The atrium was normal in size. - Pulmonary arteries: Systolic pressure was within the normal  range. - Inferior vena cava: The vessel was normal in size. - Pericardium, extracardiac: There was no pericardial effusion.  -------------------------------------------------------------------------------------------- CHEST CT FINDINGS: Cardiovascular: Extensive atherosclerotic calcifications of ascending aorta. Mild atherosclerotic calcifications of  descending aorta. No aortic aneurysm. Extensive atherosclerotic calcifications of coronary arteries. Heart size within normal limits. No pericardial effusion.  Mediastinum/Nodes: There is a midline anterior mediastinal pre aortic lymph node measures 9 mm short-axis.  A pre carinal lymph node measures 9 mm short-axis. AP window lymph node measures 9 mm short-axis. AV right pretracheal lymph node measures 1 cm short-axis.  A hiatal hernia measures 6.6 cm in diameter. Central airways are patent. Images of the thoracic inlet are unremarkable. Sub- carinal lymph node Measures 1 cm short-axis.  Lungs/Pleura: Images of the lung parenchyma shows no acute infiltrate or pulmonary edema. There is elevation of the right hemidiaphragm. There is no segmental infiltrate or pulmonary edema. Minimal atelectasis is noted in right upper lobe centrally. No pleural thickening or pleural effusion. No focal consolidation. There is no pneumothorax. Minimal atelectasis or scarring in lingula.  Upper Abdomen: The visualized upper abdomen shows no adrenal gland mass. No intrahepatic biliary ductal dilatation. Visualized upper kidneys are unremarkable. Atherosclerotic calcifications of abdominal aorta and celiac trunk origin. Few colonic diverticula are noted right colon and descending colon.  Musculoskeletal: No destructive bony lesions are noted. Sagittal images of the spine shows mild degenerative changes thoracic spine. Sagittal view of the sternum is unremarkable.  IMPRESSION: 1. There is no evidence of acute infiltrate or pleural effusion. 2. Borderline enlarged mediastinal lymph nodes probable reactive. 3. Moderate size hiatal hernia measures 6.6 cm. 4. There is elevation of the right hemidiaphragm. 5. Mild degenerative changes thoracic spine. 6. Extensive atherosclerotic calcifications of ascending aorta and coronary arteries.   Assessment/Plan:   Principal Problem:   Non-STEMI  (non-ST elevated myocardial infarction) ([place]) Active Problems:   Hypertension   GERD (gastroesophageal reflux disease)   NSTEMI (non-ST elevated myocardial infarction) ([facility])   ST elevation myocardial infarction (STEMI) ([facility])   ST elevation (STEMI) myocardial infarction of unspecified site ([place])   1. Acute coronary syndrome with severe multivessel CAD. Vessels are not suitable for PCI; Plan for high risk CABG this Friday 6/30. 2. Dilated aortic root and ascending aorta;  Ascending aorta 47 mm on echo;  No aneurysym on CT. 3.  Ischemic cardiomyopathy with EF 25-30% at cath 4.  Essential hypertension; BP now 107/94 5: Hyperlipidemia: LDL 116; on atorvastatin 80 mg. 5. GERD 6. H/o prostate Ca 7. 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  With severe anatomy agree with Dr. Nils Pyle and will have IABP placed this am to allow for further stability in anticipation of CABG tomorrow. I have discussed the IABP rationale and procedure with patient who wishes to proceed.   Troy Sine, MD, [facility] 09/21/2015, 9:19 AM

## 2015-09-21 NOTE — H&P (View-Only) (Signed)
Subjective:  Day 4 s/p NSTEMI; Recurrent chest pain last night on iv NTG/heparin; currently pain free.  Objective:   Vital Signs : Filed Vitals:   09/21/15 0500 09/21/15 0600 09/21/15 0700 09/21/15 0800  BP: 96/54 95/51 109/60 116/68  Pulse: 57 54 59   Temp:    98.1 F (36.7 C)  TempSrc:      Resp: 36 41 21   Height:      Weight:      SpO2: 99% 99% 99%     Intake/Output from previous day:  Intake/Output Summary (Last 24 hours) at 09/21/15 0919 Last data filed at 09/21/15 0800  Gross per 24 hour  Intake 1439.07 ml  Output   2250 ml  Net -810.93 ml    I/O since admission: +273  Wt Readings from Last 3 Encounters:  09/21/15 179 lb 0.2 oz (81.2 kg)  08/22/11 189 lb (85.73 kg)  07/31/11 190 lb (86.183 kg)    Medications: . amLODipine  2.5 mg Oral Daily  . antiseptic oral rinse  7 mL Mouth Rinse BID  . aspirin  81 mg Oral Daily  . atorvastatin  80 mg Oral q1800  . bisacodyl  5 mg Oral Once  . [START ON 09/22/2015] chlorhexidine  15 mL Mouth/Throat Once  . Chlorhexidine Gluconate Cloth  6 each Topical Once   And  . Chlorhexidine Gluconate Cloth  6 each Topical Once  . cloNIDine  0.1 mg Oral BID  . levothyroxine  50 mcg Oral QAC breakfast  . metoprolol tartrate  12.5 mg Oral BID  . [START ON 09/22/2015] metoprolol tartrate  12.5 mg Oral Once  . multivitamin  2 tablet Oral Daily  . pantoprazole  40 mg Oral Daily  . ranolazine  500 mg Oral BID  . sodium chloride flush  3 mL Intravenous Q12H  . spironolactone  12.5 mg Oral Daily    . sodium chloride 25 mL/hr at 09/21/15 0800  . heparin 1,450 Units/hr (09/21/15 0800)  . nitroGLYCERIN 50 mcg/min (09/21/15 0800)    Physical Exam:   General appearance: alert, cooperative and no distress Neck: no adenopathy, no carotid bruit, no JVD, supple, symmetrical, trachea midline and thyroid not enlarged, symmetric, no tenderness/mass/nodules Lungs: basilar rales/ decreased BS at bases Heart: regular rhtyhm HR 76-160; 1/6  systolic murmur, no S4 gallop.  No rubs thrills or heaves Abdomen: soft, non-tender; bowel sounds normal; no masses,  no organomegaly Extremities: no edema, redness or tenderness in the calves or thighs Pulses: 2+ and symmetric Skin: Skin color, texture, turgor normal. No rashes or lesions Neurologic: Grossly normal   Rate: 64  Rhythm: NSR  09/21/15 ECG (independently read by me): SB at 59; 1st degree AV block; lateral STT changes  09/20/15 ECG (independently read by me): SB at 56; 1st degree AV block; ST changes V4-6. I aVL.  6/27/17ECG (independently read by me): Sinus bradycardia at 50 with sinus arrythmia and PVC  09/18/15 ECG (independently read by me): Normal sinus rhythm at 74 bpm.  First-degree AV block.  Improvement in initial ST abnormalities from initial ECG of 625/17  09/17/15   ECG (independently read by me): Sinus rhythm at 93 bpm with PACs.  Diffuse global ST segment changes with 2 mm ST elevation in lead aVR  Lab Results:   Recent Labs  09/19/15 0312 09/20/15 0245 09/21/15 0400  NA 132* 133* 132*  K 3.9 4.3 3.7  CL 103 103 102  CO2 21* 22 23  GLUCOSE 103* 99 117*  BUN _0 CREATININE 1.00 1.14 0.87  CALCIUM 8.5* 8.2* 8.2*    Hepatic Function Latest Ref Rng 09/18/2015 07/26/2011  Total Protein 6.5 - 8.1 g/dL 6.1(L) 7.1  Albumin 3.5 - 5.0 g/dL 3.4(L) 3.9  AST 15 - 41 U/L 31 14  ALT 17 - 63 U/L 13(L) 11  Alk Phosphatase 38 - 126 U/L 40 40  Total Bilirubin 0.3 - 1.2 mg/dL 0.5 0.4     Recent Labs  09/19/15 0312 09/20/15 0245 09/21/15 0400  WBC 13.6* 11.4* 10.6*  HGB 10.6* 10.6* 11.0*  HCT 32.4* 31.7* 33.1*  MCV 87.3 88.8 89.2  PLT 233 247 225     Recent Labs  09/18/15 1204 09/18/15 1500  TROPONINI 2.59* 3.40*    Lab Results  Component Value Date   TSH 0.837 09/19/2015   No results for input(s): HGBA1C in the last 72 hours.  No results for input(s): PROT, ALBUMIN, AST, ALT, ALKPHOS, BILITOT, BILIDIR, IBILI in the last 72 hours. No  results for input(s): INR in the last 72 hours. BNP (last 3 results)  Recent Labs  09/18/15 0339 09/19/15 0312 09/20/15 0245  BNP 135.4* 356.6* 499.8*    ProBNP (last 3 results) No results for input(s): PROBNP in the last 8760 hours.   Lipid Panel     Component Value Date/Time   CHOL 164 09/18/2015 0339   TRIG 77 09/18/2015 0339   HDL 33* 09/18/2015 0339   CHOLHDL 5.0 09/18/2015 0339   VLDL 15 09/18/2015 0339   LDLCALC 116* 09/18/2015 0339      Imaging:  Ct Chest Wo Contrast  09/19/2015  CLINICAL DATA:  Chest pain, status post heart attack yesterday EXAM: CT CHEST WITHOUT CONTRAST TECHNIQUE: Multidetector CT imaging of the chest was performed following the standard protocol without IV contrast. COMPARISON:  Chest x-ray 09/17/2015 FINDINGS: Cardiovascular: Extensive atherosclerotic calcifications of ascending aorta. Mild atherosclerotic calcifications of descending aorta. No aortic aneurysm. Extensive atherosclerotic calcifications of coronary arteries. Heart size within normal limits. No pericardial effusion. Mediastinum/Nodes: There is a midline anterior mediastinal pre aortic lymph node measures 9 mm short-axis. A pre carinal lymph node measures 9 mm short-axis. AP window lymph node measures 9 mm short-axis. AV right pretracheal lymph node measures 1 cm short-axis. A hiatal hernia measures 6.6 cm in diameter. Central airways are patent. Images of the thoracic inlet are unremarkable. Sub- carinal lymph node Measures 1 cm short-axis. Lungs/Pleura: Images of the lung parenchyma shows no acute infiltrate or pulmonary edema. There is elevation of the right hemidiaphragm. There is no segmental infiltrate or pulmonary edema. Minimal atelectasis is noted in right upper lobe centrally. No pleural thickening or pleural effusion. No focal consolidation. There is no pneumothorax. Minimal atelectasis or scarring in lingula. Upper Abdomen: The visualized upper abdomen shows no adrenal gland mass.  No intrahepatic biliary ductal dilatation. Visualized upper kidneys are unremarkable. Atherosclerotic calcifications of abdominal aorta and celiac trunk origin. Few colonic diverticula are noted right colon and descending colon. Musculoskeletal: No destructive bony lesions are noted. Sagittal images of the spine shows mild degenerative changes thoracic spine. Sagittal view of the sternum is unremarkable. IMPRESSION: 1. There is no evidence of acute infiltrate or pleural effusion. 2. Borderline enlarged mediastinal lymph nodes probable reactive. 3. Moderate size hiatal hernia measures 6.6 cm. 4. There is elevation of the right hemidiaphragm. 5. Mild degenerative changes thoracic spine. 6. Extensive atherosclerotic calcifications of ascending aorta and coronary arteries. Electronically Signed   By: Orlean Bradford.D.  On: 09/19/2015 12:12   Dg Chest Port 1 View  09/21/2015  CLINICAL DATA:  Shortness of breath on exertion EXAM: PORTABLE CHEST 1 VIEW COMPARISON:  09/19/2015 FINDINGS: Cardiac shadow is within normal limits. Patchy new airspace opacities are noted bilaterally. No sizable effusion is noted. No bony abnormality is noted. IMPRESSION: New bilateral patchy infiltrates. Electronically Signed   By: Inez Catalina M.D.   On: 09/21/2015 07:30    Conclusion     Ost LAD lesion, 90% stenosed.  Mid LAD lesion, 90% stenosed.  2nd Diag lesion, 99% stenosed.  Dist LAD-1 lesion, 80% stenosed.  Dist LAD-2 lesion, 90% stenosed.  Ost Cx to Prox Cx lesion, 70% stenosed.  Mid Cx lesion, 100% stenosed.  Mid RCA lesion, 80% stenosed.  2nd RPLB lesion, 99% stenosed.  Dist RCA lesion, 80% stenosed.  There is severe left ventricular systolic dysfunction.  Prox RCA lesion, 40% stenosed.   Severe diffuse multivessel CAD with ostial 90% LAD stenosis, diffuse 90% proximal to mid and 72 and 80% LAD stenoses with subtotal 99% stenosis in the second diagonal vessel; total occlusion of the proximal circumflex  with collateral supplying the distal circumflex; and diffusely irregular RCA with 80% stenosis in the region of the acute margin and 99% PLA stenosis with left-to-right collaterals.  Severe global LV dysfunction compatible with an ischemic cardiomyopathy with diffuse hypokinesis and an ejection fraction of 25-30%. LVEDP 12 mm Hg  Dilated aortic root and ascending aorta.  RECOMMENDATION: Surgical consultation will be obtained for consideration of CABG revascularization surgery.     Indications    ST elevation myocardial infarction (STEMI), unspecified artery ([place]) [I21.3 (ICD-10-CM)]    Technique and Indications    Mr. Torien Ramroop is an 82 year old Caucasian male who developed significant chest pain 2 days ago. He developed recurrent chest pain this evening and presented to [facility] emergency room. His ECG revealed diffuse global ST-wave abnormalities with 2 mm of ST elevation in lead aVR. He was given IV heparin and supplemental nitroglycerin and started on IV nitroglycerin drip. A code STEMI was activated and he was transported to the [facility] catheterization laboratory for emergent cardiac catheterization.  Upon arrival to the laboratory the patient chest pain was improved but still had mild 1-2/10 chest pain. With a history of dye allergy he was pretreated with Solu-Medrol, Pepcid, and Benadryl. He received Versed 1 mg. His right femoral artery was punctured anteriorly and a 6 French sheath was inserted without difficulty. Diagnostic catheterization was done with 5 [place] JL 5 and JR4 diagnostic catheters. A 5 French pigtail catheter was used for left ventriculography. The patient was on an IV nitroglycerin drip and wiith his severe disease, his nitro drip was increased. An ACT was checked and his sheath was pulled with plans for resumption of heparin and surgical consultation for CABG surgery in the morning. He left the catheterization laboratory with stable hemodynamics chest pain-free  and was transported to the coronary care unit. During this procedure the patient was administered a total of Versed 1 mg to achieve and maintain moderate conscious sedation. The patient's heart rate, blood pressure, and oxygen saturation were monitored continuously during the procedure. The period of conscious sedation was 27 minutes, of which I was present face-to-face 100% of this time. Estimated blood loss <50 mL. There were no immediate complications during the procedure.    Coronary Findings    Dominance: Right   Left Main  The left main coronary artery was a large-caliber vessel which bifurcated into  the LAD and left circumflex vessel     Left Anterior Descending  The LAD was diffusely diseased. There was diffuse 90% ostial stenosis proximal to the first diagonal vessel. There were segmental stenoses of 90, 80, 70 and 90 throughout the entire LAD. The second diagonal vessel was subtotally occluded in its midsegment   . Ost LAD lesion, 90% stenosed.   . Mid LAD lesion, 90% stenosed.   . Dist LAD-1 lesion, 80% stenosed.   . Dist LAD-2 lesion, 90% stenosed.   . Second Diagonal Branch   . 2nd Diag lesion, 99% stenosed.     Left Circumflex  E left circumflex vessel is totally occluded proximally. There was collateralization of the distal circumflex. The the left coronary system   . Ost Cx to Prox Cx lesion, 70% stenosed.   . Mid Cx lesion, 100% stenosed.   . Third Obtuse Marginal Branch   3rd Mrg filled by collaterals from 3rd Sept.     Right Coronary Artery  RCA had diffuse irregularity and 40% proximal stenoses. There was 80% stenosis in the region of the acute margin. The vessel supply the PDA. Then the posterior lateral vessel. There was diffuse 99% stenosis in this posterior lateral vessel with retrograde filling of this be the distal LAD.   Marland Kitchen Prox RCA lesion, 40% stenosed.   . Mid RCA lesion, 80% stenosed.   . Dist RCA lesion, 80% stenosed.   . First Right Posterolateral    The vessel is small in size.   Marland Kitchen Second Right Posterolateral   2nd RPLB filled by collaterals from Dist LAD.   . 2nd RPLB lesion, 99% stenosed.      Wall Motion                 Left Heart    Left Ventricle The left ventricle is enlarged. There is severe left ventricular systolic dysfunction. The left ventricular ejection fraction is 25-35% by visual estimate. There are wall motion abnormalities in the left ventricle. Severe global LV dysfunction with an ejection fraction at 25-30%. LVEDP is 12 mmHg.   Aorta The aortic root and ascending aorta is dilated.    Coronary Diagrams    Diagnostic Diagram           ------------------------------------------------------------------- ECHO Study Conclusions  - Left ventricle: There appears to be hypokinesis in the basal and  mid inferolateral and anterolateral walls. The cavity size was  normal. There was mild focal basal hypertrophy of the septum.  Systolic function was normal. The estimated ejection fraction was  in the range of 50% to 55%. Wall motion was normal; there were no  regional wall motion abnormalities. The study is not technically  sufficient to allow evaluation of LV diastolic function. - Aortic valve: There was mild regurgitation. - Mitral valve: There was mild regurgitation. - Left atrium: The atrium was mildly dilated. - Right ventricle: The cavity size was normal. Wall thickness was  normal. Systolic function was normal. - Right atrium: The atrium was normal in size. - Pulmonary arteries: Systolic pressure was within the normal  range. - Inferior vena cava: The vessel was normal in size. - Pericardium, extracardiac: There was no pericardial effusion.  -------------------------------------------------------------------------------------------- CHEST CT FINDINGS: Cardiovascular: Extensive atherosclerotic calcifications of ascending aorta. Mild atherosclerotic calcifications of  descending aorta. No aortic aneurysm. Extensive atherosclerotic calcifications of coronary arteries. Heart size within normal limits. No pericardial effusion.  Mediastinum/Nodes: There is a midline anterior mediastinal pre aortic lymph node measures 9 mm short-axis.  A pre carinal lymph node measures 9 mm short-axis. AP window lymph node measures 9 mm short-axis. AV right pretracheal lymph node measures 1 cm short-axis.  A hiatal hernia measures 6.6 cm in diameter. Central airways are patent. Images of the thoracic inlet are unremarkable. Sub- carinal lymph node Measures 1 cm short-axis.  Lungs/Pleura: Images of the lung parenchyma shows no acute infiltrate or pulmonary edema. There is elevation of the right hemidiaphragm. There is no segmental infiltrate or pulmonary edema. Minimal atelectasis is noted in right upper lobe centrally. No pleural thickening or pleural effusion. No focal consolidation. There is no pneumothorax. Minimal atelectasis or scarring in lingula.  Upper Abdomen: The visualized upper abdomen shows no adrenal gland mass. No intrahepatic biliary ductal dilatation. Visualized upper kidneys are unremarkable. Atherosclerotic calcifications of abdominal aorta and celiac trunk origin. Few colonic diverticula are noted right colon and descending colon.  Musculoskeletal: No destructive bony lesions are noted. Sagittal images of the spine shows mild degenerative changes thoracic spine. Sagittal view of the sternum is unremarkable.  IMPRESSION: 1. There is no evidence of acute infiltrate or pleural effusion. 2. Borderline enlarged mediastinal lymph nodes probable reactive. 3. Moderate size hiatal hernia measures 6.6 cm. 4. There is elevation of the right hemidiaphragm. 5. Mild degenerative changes thoracic spine. 6. Extensive atherosclerotic calcifications of ascending aorta and coronary arteries.   Assessment/Plan:   Principal Problem:   Non-STEMI  (non-ST elevated myocardial infarction) ([place]) Active Problems:   Hypertension   GERD (gastroesophageal reflux disease)   NSTEMI (non-ST elevated myocardial infarction) ([facility])   ST elevation myocardial infarction (STEMI) ([facility])   ST elevation (STEMI) myocardial infarction of unspecified site ([place])   1. Acute coronary syndrome with severe multivessel CAD. Vessels are not suitable for PCI; Plan for high risk CABG this Friday 6/30. 2. Dilated aortic root and ascending aorta;  Ascending aorta 47 mm on echo;  No aneurysym on CT. 3.  Ischemic cardiomyopathy with EF 25-30% at cath 4.  Essential hypertension; BP now 107/94 5: Hyperlipidemia: LDL 116; on atorvastatin 80 mg. 5. GERD 6. H/o prostate Ca 7. 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  With severe anatomy agree with Dr. Nils Pyle and will have IABP placed this am to allow for further stability in anticipation of CABG tomorrow. I have discussed the IABP rationale and procedure with patient who wishes to proceed.   [name] Sine, MD, [facility] 09/21/2015, 9:19 AM

## 2015-09-21 NOTE — Anesthesia Preprocedure Evaluation (Addendum)
Anesthesia Evaluation  Patient identified by MRN, date of birth, ID band Patient awake    Reviewed: Allergy & Precautions, H&P , NPO status , Patient's Chart, lab work & pertinent test results  Airway Mallampati: II  TM Distance: >3 FB Neck ROM: Full    Dental no notable dental hx. (+) Edentulous Upper, Edentulous Lower   Pulmonary neg pulmonary ROS, COPD, former smoker,    Pulmonary exam normal breath sounds clear to auscultation       Cardiovascular hypertension, Pt. on medications + angina + CAD and + Past MI  Normal cardiovascular exam(-) dysrhythmias (-) Valvular Problems/Murmurs Rhythm:Regular Rate:Normal     Neuro/Psych negative neurological ROS  negative psych ROS   GI/Hepatic Neg liver ROS, hiatal hernia, GERD  Medicated and Controlled,  Endo/Other  negative endocrine ROSHypothyroidism   Renal/GU negative Renal ROS     Musculoskeletal negative musculoskeletal ROS (+) Arthritis ,   Abdominal   Peds  Hematology negative hematology ROS (+)   Anesthesia Other Findings H/o laryngeal cancer s/p partial resection  Reproductive/Obstetrics                             Anesthesia Physical  Anesthesia Plan  ASA: IV  Anesthesia Plan: General   Post-op Pain Management:    Induction: Intravenous  Airway Management Planned: Oral ETT  Additional Equipment: Arterial line, PA Cath, Ultrasound Guidance Line Placement, TEE and CVP  Intra-op Plan:   Post-operative Plan: Post-operative intubation/ventilation  Informed Consent: I have reviewed the patients History and Physical, chart, labs and discussed the procedure including the risks, benefits and alternatives for the proposed anesthesia with the patient or authorized representative who has indicated his/her understanding and acceptance.   Dental advisory given  Plan Discussed with: CRNA  Anesthesia Plan Comments:         Anesthesia Quick Evaluation

## 2015-09-21 NOTE — Progress Notes (Signed)
Day of Surgery Procedure(s) (LRB): IABP Insertion (N/A) Subjective: Patient appears much more comfortable and stable with intra-aortic balloon pump placed earlier today in the cath lab I discussed the procedure of CABG plan tomorrow again with the patient and his family at the bedside  Objective: Vital signs in last 24 hours: Temp:  [97.5 F (36.4 C)-99.2 F (37.3 C)] 98.3 F (36.8 C) (06/29 1515) Pulse Rate:  [0-122] 70 (06/29 1800) Cardiac Rhythm:  [-] Normal sinus rhythm (06/29 1300) Resp:  [0-95] 18 (06/29 1800) BP: (91-152)/(51-88) 108/71 mmHg (06/29 1800) SpO2:  [0 %-100 %] 97 % (06/29 1800) Weight:  [179 lb 0.2 oz (81.2 kg)] 179 lb 0.2 oz (81.2 kg) (06/29 0400)  Hemodynamic parameters for last 24 hours:   Stable sinus rhythm Intake/Output from previous day: 06/28 0701 - 06/29 0700 In: 1785.6 [P.O.:540; I.V.:1245.6] Out: 2250 [Urine:2250] Intake/Output this shift: Total I/O In: 54.5 [I.V.:54.5] Out: 650 [Urine:650]       Exam    General- alert and comfortable   Lungs- clear without rales, wheezes   Cor- regular rate and rhythm, no murmur , gallop   Abdomen- soft, non-tender   Extremities - warm, non-tender, minimal edema   Neuro- oriented, appropriate, no focal weakness   Lab Results:  Recent Labs  09/20/15 0245 09/21/15 0400  WBC 11.4* 10.6*  HGB 10.6* 11.0*  HCT 31.7* 33.1*  PLT 247 225   BMET:  Recent Labs  09/20/15 0245 09/21/15 0400  NA 133* 132*  K 4.3 3.7  CL 103 102  CO2 22 23  GLUCOSE 99 117*  BUN 16 12  CREATININE 1.14 0.87  CALCIUM 8.2* 8.2*    PT/INR: No results for input(s): LABPROT, INR in the last 72 hours. ABG No results found for: PHART, HCO3, TCO2, ACIDBASEDEF, O2SAT CBG (last 3)  No results for input(s): GLUCAP in the last 72 hours.  Assessment/Plan: S/P Procedure(s) (LRB): IABP Insertion (N/A) CABG in a.m. Benefits and risks discussed again with patient and family   LOS: 3 days    Tharon Aquas Trigt  III 09/21/2015

## 2015-09-21 NOTE — Progress Notes (Addendum)
ANTICOAGULATION CONSULT NOTE - Follow Up Consult  Pharmacy Consult for heparin Indication: S/P Cath with multi-vessel disease  Allergies  Allergen Reactions  . Sulfa Antibiotics Palpitations  . Contrast Media [Iodinated Diagnostic Agents] Rash    ivp dye---  Pt states w/ pre-treatment does okay    Patient Measurements: Height: 5\' 10"  (177.8 cm) Weight: 179 lb 0.2 oz (81.2 kg) IBW/kg (Calculated) : 73 Heparin Dosing Weight: 81.2 kg  Vital Signs: Temp: 98.3 F (36.8 C) (06/29 1515) Temp Source: Oral (06/29 1515) BP: 92/55 mmHg (06/29 1500) Pulse Rate: 64 (06/29 1500)  Labs:  Recent Labs  09/19/15 0312  09/20/15 0245 09/20/15 0320 09/21/15 0400 09/21/15 0404 09/21/15 1533  HGB 10.6*  --  10.6*  --  11.0*  --   --   HCT 32.4*  --  31.7*  --  33.1*  --   --   PLT 233  --  247  --  225  --   --   HEPARINUNFRC 0.57  < >  --  0.64  --  0.29* 0.28*  CREATININE 1.00  --  1.14  --  0.87  --   --   < > = values in this interval not displayed.  Estimated Creatinine Clearance: 64.1 mL/min (by C-G formula based on Cr of 0.87).   Medications:  Scheduled:  . [START ON 09/22/2015] aminocaproic acid (AMICAR) for OHS   Intravenous To OR  . amLODipine  2.5 mg Oral Daily  . antiseptic oral rinse  7 mL Mouth Rinse BID  . aspirin  81 mg Oral Daily  . atorvastatin  80 mg Oral q1800  . [START ON 09/22/2015] cefUROXime (ZINACEF)  IV  1.5 g Intravenous To OR  . [START ON 09/22/2015] cefUROXime (ZINACEF)  IV  750 mg Intravenous To OR  . cloNIDine  0.1 mg Oral BID  . [START ON 09/22/2015] dexmedetomidine  0.1-0.7 mcg/kg/hr Intravenous To OR  . [START ON 09/22/2015] DOPamine  0-10 mcg/kg/min Intravenous To OR  . [START ON 09/22/2015] epinephrine  0-10 mcg/min Intravenous To OR  . [START ON 09/22/2015] heparin-papaverine-plasmalyte irrigation   Irrigation To OR  . [START ON 09/22/2015] heparin 30,000 units/NS 1000 mL solution for CELLSAVER   Other To OR  . [START ON 09/22/2015] insulin (NOVOLIN-R)  infusion   Intravenous To OR  . levothyroxine  50 mcg Oral QAC breakfast  . [START ON 09/22/2015] magnesium sulfate  40 mEq Other To OR  . metoprolol tartrate  12.5 mg Oral BID  . multivitamin  2 tablet Oral Daily  . [START ON 09/22/2015] nitroGLYCERIN  2-200 mcg/min Intravenous To OR  . pantoprazole  40 mg Oral Daily  . [START ON 09/22/2015] phenylephrine (NEO-SYNEPHRINE) Adult infusion  30-200 mcg/min Intravenous To OR  . [START ON 09/22/2015] potassium chloride  80 mEq Other To OR  . ranolazine  500 mg Oral BID  . sodium chloride flush  3 mL Intravenous Q12H  . sodium chloride flush  3 mL Intravenous Q12H  . sodium chloride flush  3 mL Intravenous Q12H  . sodium chloride flush  3 mL Intravenous Q12H  . spironolactone  12.5 mg Oral Daily  . [START ON 09/22/2015] vancomycin  1,250 mg Intravenous To OR   Infusions:  . sodium chloride 25 mL/hr at 09/21/15 0800  . sodium chloride 50 mL/hr at 09/21/15 1300  . heparin 1,450 Units/hr (09/21/15 1104)  . nitroGLYCERIN 50 mcg/min (09/21/15 0800)    Assessment: 82 yo male S/P cath with multi-vessel disease  is currently on subtherapeutic heparin.  Heparin level is 0.28.  Per RN, no problem with infusion and was running at 1450 units/hr  Goal of Therapy:  Heparin level 0.3-0.7 units/ml Monitor platelets by anticoagulation protocol: Yes   Plan:  - increase heparin to 1600 units/hr - 8hr heparin level  Jeniyah Menor, Tsz-Yin 09/21/2015,4:09 PM

## 2015-09-21 NOTE — Progress Notes (Signed)
[name] [name] for Heparin  Indication: S/P Cath with multi-vessel disease  Allergies  Allergen Reactions  . Sulfa Antibiotics Palpitations  . Contrast Media [Iodinated Diagnostic Agents] Rash    ivp dye---  Pt states w/ pre-treatment does okay    Patient Measurements: Height: 5\' 10"  (177.8 cm) Weight: 179 lb 0.2 oz (81.2 kg) IBW/kg (Calculated) : 73  Vital Signs: Temp: 98.5 F (36.9 C) (06/29 0400) Temp Source: Oral (06/29 0400) BP: 123/74 mmHg (06/29 0415) Pulse Rate: 77 (06/29 0415)  Labs:  Recent Labs  09/18/15 1204 09/18/15 1500  09/19/15 0312 09/19/15 1204 09/20/15 0245 09/20/15 0320 09/21/15 0400 09/21/15 0404  HGB  --   --   < > 10.6*  --  10.6*  --  11.0*  --   HCT  --   --   --  32.4*  --  31.7*  --  33.1*  --   PLT  --   --   --  233  --  247  --  225  --   HEPARINUNFRC  --   --   < > 0.57 0.53  --  0.64  --  0.29*  CREATININE  --   --   --  1.00  --  1.14  --   --   --   TROPONINI 2.59* 3.40*  --   --   --   --   --   --   --   < > = values in this interval not displayed.  Estimated Creatinine Clearance: 48.9 mL/min (by C-G formula based on Cr of 1.14).  Assessment: 82 y/o M s/p cath with multi-vessel disease on heparin. Plans for CABG on 7/30. Heparin level now down to 0.29 (slightly subtherapeutic) on 1350 units/hr. CBC stable. No issues with line or bleeding reported per RN.  Goal of Therapy:  Heparin level 0.3-0.7 units/ml Monitor platelets by anticoagulation protocol: Yes   Plan:  -Increase heparin to 1450 units/hr -F/u 8 hr heparin level -CABG on 7/30  Sherlon Handing, PharmD, BCPS Clinical pharmacist, pager [phone]  09/21/2015 5:18 AM

## 2015-09-21 NOTE — Progress Notes (Signed)
Orthopedic Tech Progress Note Patient Details:  [name] [name] December 05, 1928 [id number]  Ortho Devices Type of Ortho Device: Knee Immobilizer Ortho Device/Splint Location: rle Ortho Device/Splint Interventions: Application   Hildred Priest 09/21/2015, 2:23 PM

## 2015-09-22 ENCOUNTER — Inpatient Hospital Stay (HOSPITAL_COMMUNITY): Payer: Medicare HMO

## 2015-09-22 ENCOUNTER — Inpatient Hospital Stay (HOSPITAL_COMMUNITY): Payer: Medicare HMO | Admitting: Certified Registered Nurse Anesthetist

## 2015-09-22 ENCOUNTER — Encounter (HOSPITAL_COMMUNITY)
Admission: EM | Disposition: A | Discharge status: Home Health | Payer: Self-pay | Source: Home / Self Care | Attending: Cardiothoracic Surgery

## 2015-09-22 DIAGNOSIS — I2510 Atherosclerotic heart disease of native coronary artery without angina pectoris: Secondary | ICD-10-CM | POA: Diagnosis present

## 2015-09-22 DIAGNOSIS — I25110 Atherosclerotic heart disease of native coronary artery with unstable angina pectoris: Secondary | ICD-10-CM

## 2015-09-22 HISTORY — PX: CORONARY ARTERY BYPASS GRAFT: SHX141

## 2015-09-22 HISTORY — PX: TEE WITHOUT CARDIOVERSION: SHX5443

## 2015-09-22 LAB — BASIC METABOLIC PANEL
ANION GAP: 7 (ref 5–15)
Anion gap: 4 — ABNORMAL LOW (ref 5–15)
BUN: 14 mg/dL (ref 6–20)
BUN: 12 mg/dL (ref 6–20)
CALCIUM: 8.1 mg/dL — AB (ref 8.9–10.3)
CO2: 22 mmol/L (ref 22–32)
CO2: 25 mmol/L (ref 22–32)
Calcium: 7.7 mg/dL — ABNORMAL LOW (ref 8.9–10.3)
Chloride: 101 mmol/L (ref 101–111)
Chloride: 104 mmol/L (ref 101–111)
Creatinine, Ser: 0.98 mg/dL (ref 0.61–1.24)
Creatinine, Ser: 0.83 mg/dL (ref 0.61–1.24)
GFR calc Af Amer: >60 mL/min (ref >60)
GFR calc non Af Amer: >60 mL/min (ref >60)
Glucose, Bld: 106 mg/dL — ABNORMAL HIGH (ref 65–99)
Glucose, Bld: 110 mg/dL — ABNORMAL HIGH (ref 65–99)
Potassium: 4.2 mmol/L (ref 3.5–5.1)
Potassium: 4.2 mmol/L (ref 3.5–5.1)
Sodium: 130 mmol/L — ABNORMAL LOW (ref 135–145)
Sodium: 133 mmol/L — ABNORMAL LOW (ref 135–145)

## 2015-09-22 LAB — POCT I-STAT, CHEM 8
BUN: 14 mg/dL (ref 6–20)
BUN: 11 mg/dL (ref 6–20)
BUN: 13 mg/dL (ref 6–20)
BUN: 12 mg/dL (ref 6–20)
BUN: 13 mg/dL (ref 6–20)
BUN: 13 mg/dL (ref 6–20)
BUN: 13 mg/dL (ref 6–20)
CALCIUM ION: 1.18 mmol/L (ref 1.12–1.23)
CHLORIDE: 93 mmol/L — AB (ref 101–111)
CHLORIDE: 96 mmol/L — AB (ref 101–111)
CREATININE: 0.8 mg/dL (ref 0.61–1.24)
CREATININE: 0.5 mg/dL — AB (ref 0.61–1.24)
CREATININE: 0.8 mg/dL (ref 0.61–1.24)
CREATININE: 0.7 mg/dL (ref 0.61–1.24)
Calcium, Ion: 1.2 mmol/L (ref 1.12–1.23)
Calcium, Ion: 0.97 mmol/L — ABNORMAL LOW (ref 1.12–1.23)
Calcium, Ion: 1.05 mmol/L — ABNORMAL LOW (ref 1.12–1.23)
Calcium, Ion: 1.04 mmol/L — ABNORMAL LOW (ref 1.12–1.23)
Calcium, Ion: 0.94 mmol/L — ABNORMAL LOW (ref 1.12–1.23)
Calcium, Ion: 1.17 mmol/L (ref 1.12–1.23)
Chloride: 98 mmol/L — ABNORMAL LOW (ref 101–111)
Chloride: 95 mmol/L — ABNORMAL LOW (ref 101–111)
Chloride: 98 mmol/L — ABNORMAL LOW (ref 101–111)
Chloride: 95 mmol/L — ABNORMAL LOW (ref 101–111)
Chloride: 100 mmol/L — ABNORMAL LOW (ref 101–111)
Creatinine, Ser: 0.6 mg/dL — ABNORMAL LOW (ref 0.61–1.24)
Creatinine, Ser: 0.8 mg/dL (ref 0.61–1.24)
Creatinine, Ser: 0.8 mg/dL (ref 0.61–1.24)
GLUCOSE: 126 mg/dL — AB (ref 65–99)
GLUCOSE: 116 mg/dL — AB (ref 65–99)
GLUCOSE: 133 mg/dL — AB (ref 65–99)
Glucose, Bld: 103 mg/dL — ABNORMAL HIGH (ref 65–99)
Glucose, Bld: 125 mg/dL — ABNORMAL HIGH (ref 65–99)
Glucose, Bld: 156 mg/dL — ABNORMAL HIGH (ref 65–99)
Glucose, Bld: 110 mg/dL — ABNORMAL HIGH (ref 65–99)
HCT: 27 % — ABNORMAL LOW (ref 39.0–52.0)
HCT: 22 % — ABNORMAL LOW (ref 39.0–52.0)
HCT: 28 % — ABNORMAL LOW (ref 39.0–52.0)
HEMATOCRIT: 30 % — AB (ref 39.0–52.0)
HEMATOCRIT: 23 % — AB (ref 39.0–52.0)
HEMATOCRIT: 22 % — AB (ref 39.0–52.0)
HEMATOCRIT: 20 % — AB (ref 39.0–52.0)
HEMOGLOBIN: 7.8 g/dL — AB (ref 13.0–17.0)
HEMOGLOBIN: 9.2 g/dL — AB (ref 13.0–17.0)
HEMOGLOBIN: 7.5 g/dL — AB (ref 13.0–17.0)
HEMOGLOBIN: 6.8 g/dL — AB (ref 13.0–17.0)
Hemoglobin: 10.2 g/dL — ABNORMAL LOW (ref 13.0–17.0)
Hemoglobin: 7.5 g/dL — ABNORMAL LOW (ref 13.0–17.0)
Hemoglobin: 9.5 g/dL — ABNORMAL LOW (ref 13.0–17.0)
POTASSIUM: 4.1 mmol/L (ref 3.5–5.1)
POTASSIUM: 4.7 mmol/L (ref 3.5–5.1)
POTASSIUM: 5.5 mmol/L — AB (ref 3.5–5.1)
POTASSIUM: 4.4 mmol/L (ref 3.5–5.1)
Potassium: 4 mmol/L (ref 3.5–5.1)
Potassium: 4 mmol/L (ref 3.5–5.1)
Potassium: 4.3 mmol/L (ref 3.5–5.1)
SODIUM: 131 mmol/L — AB (ref 135–145)
SODIUM: 134 mmol/L — AB (ref 135–145)
Sodium: 133 mmol/L — ABNORMAL LOW (ref 135–145)
Sodium: 133 mmol/L — ABNORMAL LOW (ref 135–145)
Sodium: 134 mmol/L — ABNORMAL LOW (ref 135–145)
Sodium: 128 mmol/L — ABNORMAL LOW (ref 135–145)
Sodium: 135 mmol/L (ref 135–145)
TCO2: 26 mmol/L (ref 0–100)
TCO2: 24 mmol/L (ref 0–100)
TCO2: 28 mmol/L (ref 0–100)
TCO2: 27 mmol/L (ref 0–100)
TCO2: 25 mmol/L (ref 0–100)
TCO2: 23 mmol/L (ref 0–100)
TCO2: 25 mmol/L (ref 0–100)

## 2015-09-22 LAB — POCT I-STAT 3, ART BLOOD GAS (G3+)
ACID-BASE DEFICIT: 2 mmol/L (ref 0.0–2.0)
ACID-BASE DEFICIT: 2 mmol/L (ref 0.0–2.0)
ACID-BASE DEFICIT: 2 mmol/L (ref 0.0–2.0)
ACID-BASE EXCESS: 3 mmol/L — AB (ref 0.0–2.0)
Acid-Base Excess: 1 mmol/L (ref 0.0–2.0)
Acid-base deficit: 2 mmol/L (ref 0.0–2.0)
BICARBONATE: 25.9 meq/L — AB (ref 20.0–24.0)
BICARBONATE: 27.4 meq/L — AB (ref 20.0–24.0)
BICARBONATE: 25.1 meq/L — AB (ref 20.0–24.0)
BICARBONATE: 24.3 meq/L — AB (ref 20.0–24.0)
BICARBONATE: 23.1 meq/L (ref 20.0–24.0)
Bicarbonate: 23.5 mEq/L (ref 20.0–24.0)
Bicarbonate: 24.1 mEq/L — ABNORMAL HIGH (ref 20.0–24.0)
O2 SAT: 100 %
O2 SAT: 99 %
O2 SAT: 87 %
O2 SAT: 94 %
O2 Saturation: 100 %
O2 Saturation: 91 %
O2 Saturation: 94 %
PCO2 ART: 33.2 mmHg — AB (ref 35.0–45.0)
PCO2 ART: 41.6 mmHg (ref 35.0–45.0)
PH ART: 7.437 (ref 7.350–7.450)
PH ART: 7.487 — AB (ref 7.350–7.450)
PO2 ART: 330 mmHg — AB (ref 80.0–100.0)
PO2 ART: 427 mmHg — AB (ref 80.0–100.0)
PO2 ART: 134 mmHg — AB (ref 80.0–100.0)
PO2 ART: 72 mmHg — AB (ref 80.0–100.0)
Patient temperature: 36.9
Patient temperature: 37
Patient temperature: 37.1
TCO2: 27 mmol/L (ref 0–100)
TCO2: 29 mmol/L (ref 0–100)
TCO2: 26 mmol/L (ref 0–100)
TCO2: 26 mmol/L (ref 0–100)
TCO2: 25 mmol/L (ref 0–100)
TCO2: 25 mmol/L (ref 0–100)
TCO2: 24 mmol/L (ref 0–100)
pCO2 arterial: 45 mmHg (ref 35.0–45.0)
pCO2 arterial: 40.5 mmHg (ref 35.0–45.0)
pCO2 arterial: 43.7 mmHg (ref 35.0–45.0)
pCO2 arterial: 45 mmHg (ref 35.0–45.0)
pCO2 arterial: 40.3 mmHg (ref 35.0–45.0)
pH, Arterial: 7.368 (ref 7.350–7.450)
pH, Arterial: 7.347 — ABNORMAL LOW (ref 7.350–7.450)
pH, Arterial: 7.36 (ref 7.350–7.450)
pH, Arterial: 7.338 — ABNORMAL LOW (ref 7.350–7.450)
pH, Arterial: 7.367 (ref 7.350–7.450)
pO2, Arterial: 53 mmHg — ABNORMAL LOW (ref 80.0–100.0)
pO2, Arterial: 73 mmHg — ABNORMAL LOW (ref 80.0–100.0)
pO2, Arterial: 65 mmHg — ABNORMAL LOW (ref 80.0–100.0)

## 2015-09-22 LAB — CBC
HCT: 28.7 % — ABNORMAL LOW (ref 39.0–52.0)
HEMATOCRIT: 33.6 % — AB (ref 39.0–52.0)
HEMATOCRIT: 28.5 % — AB (ref 39.0–52.0)
HEMOGLOBIN: 9.4 g/dL — AB (ref 13.0–17.0)
Hemoglobin: 11 g/dL — ABNORMAL LOW (ref 13.0–17.0)
Hemoglobin: 9.6 g/dL — ABNORMAL LOW (ref 13.0–17.0)
MCH: 29.5 pg (ref 26.0–34.0)
MCH: 29.6 pg (ref 26.0–34.0)
MCH: 29.8 pg (ref 26.0–34.0)
MCHC: 32.7 g/dL (ref 30.0–36.0)
MCHC: 33 g/dL (ref 30.0–36.0)
MCHC: 33.4 g/dL (ref 30.0–36.0)
MCV: 90.1 fL (ref 78.0–100.0)
MCV: 89.6 fL (ref 78.0–100.0)
MCV: 89.1 fL (ref 78.0–100.0)
Platelets: 233 10*3/uL (ref 150–400)
Platelets: 203 10*3/uL (ref 150–400)
Platelets: 226 10*3/uL (ref 150–400)
RBC: 3.73 MIL/uL — AB (ref 4.22–5.81)
RBC: 3.18 MIL/uL — ABNORMAL LOW (ref 4.22–5.81)
RBC: 3.22 MIL/uL — ABNORMAL LOW (ref 4.22–5.81)
RDW: 13.4 % (ref 11.5–15.5)
RDW: 13.4 % (ref 11.5–15.5)
RDW: 13.5 % (ref 11.5–15.5)
WBC: 8.6 10*3/uL (ref 4.0–10.5)
WBC: 13.8 10*3/uL — ABNORMAL HIGH (ref 4.0–10.5)
WBC: 11.9 10*3/uL — ABNORMAL HIGH (ref 4.0–10.5)

## 2015-09-22 LAB — POCT I-STAT 4, (NA,K, GLUC, HGB,HCT)
Glucose, Bld: 157 mg/dL — ABNORMAL HIGH (ref 65–99)
HCT: 27 % — ABNORMAL LOW (ref 39.0–52.0)
Hemoglobin: 9.2 g/dL — ABNORMAL LOW (ref 13.0–17.0)
Potassium: 4 mmol/L (ref 3.5–5.1)
Sodium: 135 mmol/L (ref 135–145)

## 2015-09-22 LAB — BLOOD GAS, ARTERIAL
Acid-base deficit: 0.2 mmol/L (ref 0.0–2.0)
Bicarbonate: 23.5 mEq/L (ref 20.0–24.0)
Drawn by: 418751
FIO2: 0.21
O2 Saturation: 88.1 %
Patient temperature: 98.6
TCO2: 24.6 mmol/L (ref 0–100)
pCO2 arterial: 35.8 mmHg (ref 35.0–45.0)
pH, Arterial: 7.433 (ref 7.350–7.450)
pO2, Arterial: 52 mmHg — ABNORMAL LOW (ref 80.0–100.0)

## 2015-09-22 LAB — HEMOGLOBIN AND HEMATOCRIT, BLOOD
HCT: 21.2 % — ABNORMAL LOW (ref 39.0–52.0)
Hemoglobin: 7 g/dL — ABNORMAL LOW (ref 13.0–17.0)

## 2015-09-22 LAB — HEPARIN LEVEL (UNFRACTIONATED): HEPARIN UNFRACTIONATED: 0.32 [IU]/mL (ref 0.30–0.70)

## 2015-09-22 LAB — APTT: aPTT: 37 seconds (ref 24–37)

## 2015-09-22 LAB — PREPARE RBC (CROSSMATCH)

## 2015-09-22 LAB — GLUCOSE, CAPILLARY
GLUCOSE-CAPILLARY: 141 mg/dL — AB (ref 65–99)
Glucose-Capillary: 156 mg/dL — ABNORMAL HIGH (ref 65–99)

## 2015-09-22 LAB — PROTIME-INR
INR: 1.62 — AB (ref 0.00–1.49)
Prothrombin Time: 19.3 seconds — ABNORMAL HIGH (ref 11.6–15.2)

## 2015-09-22 LAB — ABO/RH: ABO/RH(D): O POS

## 2015-09-22 LAB — MAGNESIUM: Magnesium: 2.8 mg/dL — ABNORMAL HIGH (ref 1.7–2.4)

## 2015-09-22 LAB — PLATELET COUNT: Platelets: 115 10*3/uL — ABNORMAL LOW (ref 150–400)

## 2015-09-22 SURGERY — CORONARY ARTERY BYPASS GRAFTING (CABG)
Anesthesia: General | Site: Chest

## 2015-09-22 MED ORDER — VANCOMYCIN HCL IN DEXTROSE 1-5 GM/200ML-% IV SOLN
1000.0000 mg | Freq: Once | INTRAVENOUS | Status: DC
  Filled 2015-09-22: qty 200

## 2015-09-22 MED ORDER — 0.9 % SODIUM CHLORIDE (POUR BTL) OPTIME
TOPICAL | Status: DC | PRN
  Administered 2015-09-22: 6000 mL

## 2015-09-22 MED ORDER — FENTANYL CITRATE (PF) 100 MCG/2ML IJ SOLN
INTRAMUSCULAR | Status: DC | PRN
  Administered 2015-09-22: 25 ug via INTRAVENOUS
  Administered 2015-09-22 (×2): 100 ug via INTRAVENOUS
  Administered 2015-09-22: 250 ug via INTRAVENOUS
  Administered 2015-09-22: 100 ug via INTRAVENOUS
  Administered 2015-09-22: 25 ug via INTRAVENOUS
  Administered 2015-09-22 (×2): 100 ug via INTRAVENOUS
  Administered 2015-09-22: 250 ug via INTRAVENOUS

## 2015-09-22 MED ORDER — HEPARIN SODIUM (PORCINE) 1000 UNIT/ML IJ SOLN
INTRAMUSCULAR | Status: DC | PRN
  Administered 2015-09-22 (×2): 2000 [IU] via INTRAVENOUS
  Administered 2015-09-22: 4000 [IU] via INTRAVENOUS
  Administered 2015-09-22: 32000 [IU] via INTRAVENOUS

## 2015-09-22 MED ORDER — LACTATED RINGERS IV SOLN: INTRAVENOUS | Status: DC

## 2015-09-22 MED ORDER — LACTATED RINGERS IV SOLN
INTRAVENOUS | Status: DC | PRN
  Administered 2015-09-22 (×4): via INTRAVENOUS

## 2015-09-22 MED ORDER — MILRINONE LACTATE IN DEXTROSE 20-5 MG/100ML-% IV SOLN
0.1000 ug/kg/min | INTRAVENOUS | Status: DC
  Administered 2015-09-23 – 2015-09-25 (×5): 0.3 ug/kg/min via INTRAVENOUS
  Administered 2015-09-25 – 2015-09-26 (×2): 0.2 ug/kg/min via INTRAVENOUS
  Filled 2015-09-22 (×7): qty 100

## 2015-09-22 MED ORDER — LACTATED RINGERS IV SOLN: 500.0000 mL | Freq: Once | INTRAVENOUS | Status: DC | PRN

## 2015-09-22 MED ORDER — DOPAMINE-DEXTROSE 3.2-5 MG/ML-% IV SOLN
0.0000 ug/kg/min | INTRAVENOUS | Status: DC
  Filled 2015-09-22: qty 250

## 2015-09-22 MED ORDER — METOPROLOL TARTRATE 12.5 MG HALF TABLET
12.5000 mg | ORAL_TABLET | Freq: Two times a day (BID) | ORAL | Status: DC
  Administered 2015-09-23 – 2015-09-26 (×6): 12.5 mg via ORAL
  Filled 2015-09-22 (×6): qty 1

## 2015-09-22 MED ORDER — DEXMEDETOMIDINE HCL IN NACL 200 MCG/50ML IV SOLN
0.0000 ug/kg/h | INTRAVENOUS | Status: DC
  Filled 2015-09-22: qty 50

## 2015-09-22 MED ORDER — LACTATED RINGERS IV SOLN
INTRAVENOUS | Status: DC
  Administered 2015-09-24: 20 mL/h via INTRAVENOUS
  Administered 2015-09-25: 14:00:00 via INTRAVENOUS

## 2015-09-22 MED ORDER — ROCURONIUM BROMIDE 50 MG/5ML IV SOLN
INTRAVENOUS | Status: AC
  Filled 2015-09-22: qty 2

## 2015-09-22 MED ORDER — DIPHENHYDRAMINE HCL 50 MG/ML IJ SOLN
INTRAMUSCULAR | Status: DC | PRN
  Administered 2015-09-22: 25 mg via INTRAVENOUS

## 2015-09-22 MED ORDER — PROTAMINE SULFATE 10 MG/ML IV SOLN
INTRAVENOUS | Status: AC
  Filled 2015-09-22: qty 25

## 2015-09-22 MED ORDER — DOCUSATE SODIUM 100 MG PO CAPS
200.0000 mg | ORAL_CAPSULE | Freq: Every day | ORAL | Status: DC
  Administered 2015-09-23 – 2015-09-30 (×7): 200 mg via ORAL
  Filled 2015-09-22 (×9): qty 2

## 2015-09-22 MED ORDER — FAMOTIDINE IN NACL 20-0.9 MG/50ML-% IV SOLN
20.0000 mg | Freq: Two times a day (BID) | INTRAVENOUS | Status: AC
  Administered 2015-09-22: 20 mg via INTRAVENOUS

## 2015-09-22 MED ORDER — MORPHINE SULFATE (PF) 2 MG/ML IV SOLN
2.0000 mg | INTRAVENOUS | Status: DC | PRN
  Administered 2015-09-22 – 2015-09-24 (×4): 2 mg via INTRAVENOUS
  Filled 2015-09-22 (×4): qty 1

## 2015-09-22 MED ORDER — MIDAZOLAM HCL 2 MG/2ML IJ SOLN
INTRAMUSCULAR | Status: AC
  Filled 2015-09-22: qty 2

## 2015-09-22 MED ORDER — ACETAMINOPHEN 160 MG/5ML PO SOLN: 650.0000 mg | Freq: Once | ORAL | Status: AC

## 2015-09-22 MED ORDER — NITROGLYCERIN IN D5W 200-5 MCG/ML-% IV SOLN
0.0000 ug/min | INTRAVENOUS | Status: DC
Start: 2015-09-22 — End: 2015-09-25

## 2015-09-22 MED ORDER — PROTAMINE SULFATE 10 MG/ML IV SOLN
INTRAVENOUS | Status: AC
  Filled 2015-09-22: qty 5

## 2015-09-22 MED ORDER — INSULIN REGULAR BOLUS VIA INFUSION
0.0000 [IU] | Freq: Three times a day (TID) | INTRAVENOUS | Status: DC
  Filled 2015-09-22: qty 10

## 2015-09-22 MED ORDER — CALCIUM CHLORIDE 10 % IV SOLN
INTRAVENOUS | Status: DC | PRN
  Administered 2015-09-22: 300 mg via INTRAVENOUS

## 2015-09-22 MED ORDER — SODIUM CHLORIDE 0.9 % IV SOLN
20.0000 ug | INTRAVENOUS | Status: AC
  Administered 2015-09-22: 20 ug via INTRAVENOUS
  Filled 2015-09-22: qty 5

## 2015-09-22 MED ORDER — OXYCODONE HCL 5 MG PO TABS
5.0000 mg | ORAL_TABLET | ORAL | Status: DC | PRN
  Administered 2015-09-23 (×2): 5 mg via ORAL
  Filled 2015-09-22 (×2): qty 1

## 2015-09-22 MED ORDER — FENTANYL CITRATE (PF) 250 MCG/5ML IJ SOLN
INTRAMUSCULAR | Status: AC
  Filled 2015-09-22: qty 25

## 2015-09-22 MED ORDER — ASPIRIN 81 MG PO CHEW: 324.0000 mg | CHEWABLE_TABLET | Freq: Every day | ORAL | Status: DC

## 2015-09-22 MED ORDER — ONDANSETRON HCL 4 MG/2ML IJ SOLN
4.0000 mg | Freq: Four times a day (QID) | INTRAMUSCULAR | Status: DC | PRN
  Administered 2015-09-23 – 2015-09-29 (×5): 4 mg via INTRAVENOUS
  Filled 2015-09-22 (×5): qty 2

## 2015-09-22 MED ORDER — PHENYLEPHRINE HCL 10 MG/ML IJ SOLN
INTRAMUSCULAR | Status: DC | PRN
  Administered 2015-09-22 (×2): 80 ug via INTRAVENOUS

## 2015-09-22 MED ORDER — SODIUM CHLORIDE 0.45 % IV SOLN
INTRAVENOUS | Status: DC | PRN
  Administered 2015-09-22: 20 mL/h via INTRAVENOUS
  Administered 2015-09-23: 10 mL/h via INTRAVENOUS

## 2015-09-22 MED ORDER — METOPROLOL TARTRATE 25 MG/10 ML ORAL SUSPENSION: 12.5000 mg | Freq: Two times a day (BID) | ORAL | Status: DC

## 2015-09-22 MED ORDER — VANCOMYCIN HCL IN DEXTROSE 1-5 GM/200ML-% IV SOLN
1000.0000 mg | Freq: Two times a day (BID) | INTRAVENOUS | Status: AC
  Administered 2015-09-22 – 2015-09-23 (×2): 1000 mg via INTRAVENOUS
  Filled 2015-09-22 (×2): qty 200

## 2015-09-22 MED ORDER — METOPROLOL TARTRATE 5 MG/5ML IV SOLN
2.5000 mg | INTRAVENOUS | Status: DC | PRN
  Administered 2015-09-23: 2.5 mg via INTRAVENOUS
  Filled 2015-09-22: qty 5

## 2015-09-22 MED ORDER — MORPHINE SULFATE (PF) 2 MG/ML IV SOLN: 1.0000 mg | INTRAVENOUS | Status: AC | PRN

## 2015-09-22 MED ORDER — DEXTROSE 5 % IV SOLN
1.5000 g | Freq: Two times a day (BID) | INTRAVENOUS | Status: AC
  Administered 2015-09-22 – 2015-09-24 (×4): 1.5 g via INTRAVENOUS
  Filled 2015-09-22 (×4): qty 1.5

## 2015-09-22 MED ORDER — CHLORHEXIDINE GLUCONATE 0.12% ORAL RINSE (MEDLINE KIT)
15.0000 mL | Freq: Two times a day (BID) | OROMUCOSAL | Status: DC
  Administered 2015-09-22 – 2015-09-28 (×11): 15 mL via OROMUCOSAL

## 2015-09-22 MED ORDER — TRAMADOL HCL 50 MG PO TABS: 50.0000 mg | ORAL_TABLET | ORAL | Status: DC | PRN

## 2015-09-22 MED ORDER — SODIUM CHLORIDE 0.9 % IV SOLN: 250.0000 mL | INTRAVENOUS | Status: DC

## 2015-09-22 MED ORDER — MILRINONE LACTATE IN DEXTROSE 20-5 MG/100ML-% IV SOLN
0.2500 ug/kg/min | INTRAVENOUS | Status: AC
  Administered 2015-09-22: .3 ug/kg/min via INTRAVENOUS
  Filled 2015-09-22: qty 100

## 2015-09-22 MED ORDER — SODIUM CHLORIDE 0.9% FLUSH
3.0000 mL | Freq: Two times a day (BID) | INTRAVENOUS | Status: DC
  Administered 2015-09-23: 3 mL via INTRAVENOUS

## 2015-09-22 MED ORDER — ROCURONIUM BROMIDE 100 MG/10ML IV SOLN
INTRAVENOUS | Status: DC | PRN
  Administered 2015-09-22 (×3): 50 mg via INTRAVENOUS

## 2015-09-22 MED ORDER — SODIUM CHLORIDE 0.9 % IV SOLN
INTRAVENOUS | Status: DC
  Administered 2015-09-22: 20 mL/h via INTRAVENOUS
  Administered 2015-09-23: 10 mL/h via INTRAVENOUS

## 2015-09-22 MED ORDER — MIDAZOLAM HCL 2 MG/2ML IJ SOLN: 2.0000 mg | INTRAMUSCULAR | Status: DC | PRN

## 2015-09-22 MED ORDER — SODIUM CHLORIDE 0.9 % IV SOLN
INTRAVENOUS | Status: DC
  Administered 2015-09-22: 2.2 [IU]/h via INTRAVENOUS
  Filled 2015-09-22 (×2): qty 2.5

## 2015-09-22 MED ORDER — HEMOSTATIC AGENTS (NO CHARGE) OPTIME
TOPICAL | Status: DC | PRN
  Administered 2015-09-22: 1 via TOPICAL

## 2015-09-22 MED ORDER — BISACODYL 10 MG RE SUPP: 10.0000 mg | Freq: Every day | RECTAL | Status: DC

## 2015-09-22 MED ORDER — PROTAMINE SULFATE 10 MG/ML IV SOLN
INTRAVENOUS | Status: DC | PRN
  Administered 2015-09-22: 30 mg via INTRAVENOUS
  Administered 2015-09-22: 50 mg via INTRAVENOUS
  Administered 2015-09-22 (×2): 20 mg via INTRAVENOUS
  Administered 2015-09-22: 30 mg via INTRAVENOUS
  Administered 2015-09-22 (×2): 20 mg via INTRAVENOUS
  Administered 2015-09-22: 50 mg via INTRAVENOUS
  Administered 2015-09-22 (×2): 30 mg via INTRAVENOUS

## 2015-09-22 MED ORDER — SODIUM CHLORIDE 0.9 % IV SOLN
INTRAVENOUS | Status: DC | PRN
  Administered 2015-09-22 (×2): via INTRAVENOUS

## 2015-09-22 MED ORDER — ACETAMINOPHEN 500 MG PO TABS
1000.0000 mg | ORAL_TABLET | Freq: Four times a day (QID) | ORAL | Status: AC
  Administered 2015-09-22 – 2015-09-27 (×17): 1000 mg via ORAL
  Filled 2015-09-22 (×18): qty 2

## 2015-09-22 MED ORDER — DIPHENHYDRAMINE HCL 50 MG/ML IJ SOLN
INTRAMUSCULAR | Status: AC
  Filled 2015-09-22: qty 1

## 2015-09-22 MED ORDER — GELATIN ABSORBABLE MT POWD
OROMUCOSAL | Status: DC | PRN
  Administered 2015-09-22 (×4): via TOPICAL

## 2015-09-22 MED ORDER — POTASSIUM CHLORIDE 10 MEQ/50ML IV SOLN
10.0000 meq | INTRAVENOUS | Status: AC
  Administered 2015-09-22 (×3): 10 meq via INTRAVENOUS

## 2015-09-22 MED ORDER — SUCCINYLCHOLINE CHLORIDE 20 MG/ML IJ SOLN
INTRAMUSCULAR | Status: DC | PRN
  Administered 2015-09-22: 100 mg via INTRAVENOUS

## 2015-09-22 MED ORDER — GLYCOPYRROLATE 0.2 MG/ML IJ SOLN
INTRAMUSCULAR | Status: DC | PRN
  Administered 2015-09-22: 0.4 mg via INTRAVENOUS

## 2015-09-22 MED ORDER — SODIUM CHLORIDE 0.9% FLUSH: 3.0000 mL | INTRAVENOUS | Status: DC | PRN

## 2015-09-22 MED ORDER — PHENYLEPHRINE HCL 10 MG/ML IJ SOLN
0.0000 ug/min | INTRAVENOUS | Status: DC
  Administered 2015-09-22: 60 ug/min via INTRAVENOUS
  Filled 2015-09-22 (×2): qty 2

## 2015-09-22 MED ORDER — PANTOPRAZOLE SODIUM 40 MG PO TBEC
40.0000 mg | DELAYED_RELEASE_TABLET | Freq: Every day | ORAL | Status: DC
  Administered 2015-09-24 – 2015-09-30 (×7): 40 mg via ORAL
  Filled 2015-09-22 (×8): qty 1

## 2015-09-22 MED ORDER — ALBUMIN HUMAN 5 % IV SOLN: 250.0000 mL | INTRAVENOUS | Status: AC | PRN

## 2015-09-22 MED ORDER — CHLORHEXIDINE GLUCONATE 0.12 % MT SOLN
15.0000 mL | OROMUCOSAL | Status: AC
  Administered 2015-09-22: 15 mL via OROMUCOSAL

## 2015-09-22 MED ORDER — ASPIRIN EC 325 MG PO TBEC
325.0000 mg | DELAYED_RELEASE_TABLET | Freq: Every day | ORAL | Status: DC
  Administered 2015-09-23 – 2015-09-27 (×5): 325 mg via ORAL
  Filled 2015-09-22 (×5): qty 1

## 2015-09-22 MED ORDER — ETOMIDATE 2 MG/ML IV SOLN
INTRAVENOUS | Status: AC
  Filled 2015-09-22: qty 10

## 2015-09-22 MED ORDER — PHENYLEPHRINE HCL 10 MG/ML IJ SOLN
10.0000 mg | INTRAVENOUS | Status: DC | PRN
  Administered 2015-09-22: 100 ug/min via INTRAVENOUS

## 2015-09-22 MED ORDER — HEPARIN SODIUM (PORCINE) 1000 UNIT/ML IJ SOLN
INTRAMUSCULAR | Status: AC
  Filled 2015-09-22: qty 1

## 2015-09-22 MED ORDER — ACETAMINOPHEN 160 MG/5ML PO SOLN: 1000.0000 mg | Freq: Four times a day (QID) | ORAL | Status: AC

## 2015-09-22 MED ORDER — MIDAZOLAM HCL 5 MG/5ML IJ SOLN
INTRAMUSCULAR | Status: DC | PRN
  Administered 2015-09-22: 2 mg via INTRAVENOUS

## 2015-09-22 MED ORDER — BISACODYL 5 MG PO TBEC
10.0000 mg | DELAYED_RELEASE_TABLET | Freq: Every day | ORAL | Status: DC
  Administered 2015-09-23 – 2015-09-30 (×5): 10 mg via ORAL
  Filled 2015-09-22 (×7): qty 2

## 2015-09-22 MED ORDER — PROPOFOL 10 MG/ML IV BOLUS
INTRAVENOUS | Status: AC
  Filled 2015-09-22: qty 20

## 2015-09-22 MED ORDER — ANTISEPTIC ORAL RINSE SOLUTION (CORINZ)
7.0000 mL | Freq: Four times a day (QID) | OROMUCOSAL | Status: DC
  Administered 2015-09-22 – 2015-09-26 (×14): 7 mL via OROMUCOSAL

## 2015-09-22 MED ORDER — MAGNESIUM SULFATE 4 GM/100ML IV SOLN
4.0000 g | Freq: Once | INTRAVENOUS | Status: AC
  Administered 2015-09-22: 4 g via INTRAVENOUS
  Filled 2015-09-22: qty 100

## 2015-09-22 MED ORDER — PROPOFOL 10 MG/ML IV BOLUS
INTRAVENOUS | Status: DC | PRN
  Administered 2015-09-22: 70 mg via INTRAVENOUS

## 2015-09-22 MED ORDER — ACETAMINOPHEN 650 MG RE SUPP
650.0000 mg | Freq: Once | RECTAL | Status: AC
  Administered 2015-09-22: 650 mg via RECTAL

## 2015-09-22 MED FILL — Potassium Chloride Inj 2 mEq/ML: INTRAVENOUS | Qty: 40 | Status: AC

## 2015-09-22 MED FILL — Heparin Sodium (Porcine) Inj 1000 Unit/ML: INTRAMUSCULAR | Qty: 30 | Status: AC

## 2015-09-22 MED FILL — Magnesium Sulfate Inj 50%: INTRAMUSCULAR | Qty: 10 | Status: AC

## 2015-09-22 SURGICAL SUPPLY — 122 items
ADAPTER CARDIO PERF ANTE/RETRO (ADAPTER) ×4 IMPLANT
ADPR PRFSN 84XANTGRD RTRGD (ADAPTER) ×2
APL SRG 7X2 LUM MLBL SLNT (VASCULAR PRODUCTS) ×2
APPLICATOR TIP COSEAL (VASCULAR PRODUCTS) ×2 IMPLANT
BAG DECANTER FOR FLEXI CONT (MISCELLANEOUS) ×4 IMPLANT
BANDAGE ACE 4X5 VEL STRL LF (GAUZE/BANDAGES/DRESSINGS) ×2 IMPLANT
BANDAGE ACE 6X5 VEL STRL LF (GAUZE/BANDAGES/DRESSINGS) ×2 IMPLANT
BANDAGE ELASTIC 4 VELCRO ST LF (GAUZE/BANDAGES/DRESSINGS) ×4 IMPLANT
BANDAGE ELASTIC 6 VELCRO ST LF (GAUZE/BANDAGES/DRESSINGS) ×4 IMPLANT
BASKET HEART  (ORDER IN 25'S) (MISCELLANEOUS) ×1
BASKET HEART (ORDER IN 25'S) (MISCELLANEOUS) ×1
BASKET HEART (ORDER IN 25S) (MISCELLANEOUS) ×2 IMPLANT
BLADE STERNUM SYSTEM 6 (BLADE) ×4 IMPLANT
BLADE SURG 12 STRL SS (BLADE) ×4 IMPLANT
BLADE SURG 15 STRL LF DISP TIS (BLADE) IMPLANT
BLADE SURG 15 STRL SS (BLADE) ×4
BLADE SURG ROTATE 9660 (MISCELLANEOUS) IMPLANT
BNDG GAUZE ELAST 4 BULKY (GAUZE/BANDAGES/DRESSINGS) ×4 IMPLANT
CANISTER SUCTION 2500CC (MISCELLANEOUS) ×4 IMPLANT
CANNULA GRAFT 8MMX50CM (Graft) ×2 IMPLANT
CANNULA GUNDRY RCSP 15FR (MISCELLANEOUS) ×4 IMPLANT
CATH CPB KIT VANTRIGT (MISCELLANEOUS) ×4 IMPLANT
CATH ROBINSON RED A/P 18FR (CATHETERS) ×12 IMPLANT
CATH THORACIC 36FR RT ANG (CATHETERS) ×4 IMPLANT
CLIP TI WIDE RED SMALL 24 (CLIP) ×4 IMPLANT
COVER SURGICAL LIGHT HANDLE (MISCELLANEOUS) ×4 IMPLANT
CRADLE DONUT ADULT HEAD (MISCELLANEOUS) ×4 IMPLANT
DRAIN CHANNEL 32F RND 10.7 FF (WOUND CARE) ×4 IMPLANT
DRAPE CARDIOVASCULAR INCISE (DRAPES) ×4
DRAPE PROXIMA HALF (DRAPES) ×2 IMPLANT
DRAPE SLUSH/WARMER DISC (DRAPES) ×4 IMPLANT
DRAPE SRG 135X102X78XABS (DRAPES) ×2 IMPLANT
DRSG AQUACEL AG ADV 3.5X14 (GAUZE/BANDAGES/DRESSINGS) ×4 IMPLANT
ELECT BLADE 4.0 EZ CLEAN MEGAD (MISCELLANEOUS) ×4
ELECT BLADE 6.5 EXT (BLADE) ×4 IMPLANT
ELECT CAUTERY BLADE 6.4 (BLADE) ×4 IMPLANT
ELECT REM PT RETURN 9FT ADLT (ELECTROSURGICAL) ×8
ELECTRODE BLDE 4.0 EZ CLN MEGD (MISCELLANEOUS) ×2 IMPLANT
ELECTRODE REM PT RTRN 9FT ADLT (ELECTROSURGICAL) ×4 IMPLANT
FELT TEFLON 1X6 (MISCELLANEOUS) ×4 IMPLANT
GAUZE SPONGE 4X4 12PLY STRL (GAUZE/BANDAGES/DRESSINGS) ×8 IMPLANT
GLOVE BIO SURGEON STRL SZ 6.5 (GLOVE) ×3 IMPLANT
GLOVE BIO SURGEON STRL SZ7 (GLOVE) ×10 IMPLANT
GLOVE BIO SURGEON STRL SZ7.5 (GLOVE) ×16 IMPLANT
GLOVE BIO SURGEONS STRL SZ 6.5 (GLOVE) ×3
GLOVE BIOGEL PI IND STRL 6 (GLOVE) IMPLANT
GLOVE BIOGEL PI IND STRL 6.5 (GLOVE) IMPLANT
GLOVE BIOGEL PI IND STRL 7.0 (GLOVE) IMPLANT
GLOVE BIOGEL PI INDICATOR 6 (GLOVE) ×4
GLOVE BIOGEL PI INDICATOR 6.5 (GLOVE) ×4
GLOVE BIOGEL PI INDICATOR 7.0 (GLOVE) ×2
GOWN STRL REUS W/ TWL LRG LVL3 (GOWN DISPOSABLE) ×8 IMPLANT
GOWN STRL REUS W/TWL LRG LVL3 (GOWN DISPOSABLE) ×32
GRASPER SUT TROCAR 14GX15 (MISCELLANEOUS) ×2 IMPLANT
HANDLE STAPLE ENDO GIA SHORT (STAPLE) ×2
HEMOSTAT POWDER SURGIFOAM 1G (HEMOSTASIS) ×12 IMPLANT
HEMOSTAT SURGICEL 2X14 (HEMOSTASIS) ×4 IMPLANT
INSERT FOGARTY XLG (MISCELLANEOUS) ×2 IMPLANT
KIT BASIN OR (CUSTOM PROCEDURE TRAY) ×4 IMPLANT
KIT ROOM TURNOVER OR (KITS) ×4 IMPLANT
KIT SUCTION CATH 14FR (SUCTIONS) ×8 IMPLANT
KIT VASOVIEW 6 PRO VH 2400 (KITS) ×4 IMPLANT
LEAD PACING MYOCARDI (MISCELLANEOUS) ×4 IMPLANT
LOOP VESSEL MAXI BLUE (MISCELLANEOUS) ×2 IMPLANT
LOOP VESSEL MINI RED (MISCELLANEOUS) ×2 IMPLANT
MARKER GRAFT CORONARY BYPASS (MISCELLANEOUS) ×12 IMPLANT
NS IRRIG 1000ML POUR BTL (IV SOLUTION) ×24 IMPLANT
PACK OPEN HEART (CUSTOM PROCEDURE TRAY) ×4 IMPLANT
PAD ARMBOARD 7.5X6 YLW CONV (MISCELLANEOUS) ×8 IMPLANT
PAD ELECT DEFIB RADIOL ZOLL (MISCELLANEOUS) ×4 IMPLANT
PENCIL BUTTON HOLSTER BLD 10FT (ELECTRODE) ×4 IMPLANT
PUNCH AORTIC ROTATE 4.0MM (MISCELLANEOUS) IMPLANT
PUNCH AORTIC ROTATE 4.5MM 8IN (MISCELLANEOUS) ×2 IMPLANT
PUNCH AORTIC ROTATE 5MM 8IN (MISCELLANEOUS) IMPLANT
RELOAD ENDO GIA 30 2.5 (STAPLE) ×2 IMPLANT
SEALANT SURG COSEAL 8ML (VASCULAR PRODUCTS) ×2 IMPLANT
SET CARDIOPLEGIA MPS 5001102 (MISCELLANEOUS) ×2 IMPLANT
SPONGE GAUZE 4X4 12PLY STER LF (GAUZE/BANDAGES/DRESSINGS) ×4 IMPLANT
SPONGE LAP 18X18 X RAY DECT (DISPOSABLE) ×4 IMPLANT
STAPLER ENDO GIA 12 SHRT THIN (STAPLE) IMPLANT
STAPLER ENDO GIA 12MM SHORT (STAPLE) ×2 IMPLANT
STAPLER VISISTAT 35W (STAPLE) ×2 IMPLANT
SURGIFLO W/THROMBIN 8M KIT (HEMOSTASIS) ×4 IMPLANT
SUT BONE WAX W31G (SUTURE) ×4 IMPLANT
SUT MNCRL AB 3-0 PS2 18 (SUTURE) ×2 IMPLANT
SUT MNCRL AB 4-0 PS2 18 (SUTURE) IMPLANT
SUT PROLENE 3 0 SH DA (SUTURE) ×2 IMPLANT
SUT PROLENE 3 0 SH1 36 (SUTURE) IMPLANT
SUT PROLENE 4 0 RB 1 (SUTURE) ×12
SUT PROLENE 4 0 SH DA (SUTURE) ×6 IMPLANT
SUT PROLENE 4-0 RB1 .5 CRCL 36 (SUTURE) ×2 IMPLANT
SUT PROLENE 5 0 C 1 36 (SUTURE) ×4 IMPLANT
SUT PROLENE 6 0 C 1 30 (SUTURE) ×4 IMPLANT
SUT PROLENE 6 0 CC (SUTURE) ×16 IMPLANT
SUT PROLENE 8 0 BV175 6 (SUTURE) IMPLANT
SUT PROLENE BLUE 7 0 (SUTURE) ×6 IMPLANT
SUT SILK  1 MH (SUTURE)
SUT SILK 1 MH (SUTURE) IMPLANT
SUT SILK 2 0 SH CR/8 (SUTURE) ×2 IMPLANT
SUT SILK 3 0 REEL (SUTURE) ×2 IMPLANT
SUT SILK 3 0 SH CR/8 (SUTURE) ×2 IMPLANT
SUT SILK 4 0 REEL (SUTURE) ×2 IMPLANT
SUT STEEL 6MS V (SUTURE) ×8 IMPLANT
SUT STEEL SZ 6 DBL 3X14 BALL (SUTURE) ×4 IMPLANT
SUT VIC AB 1 CTX 18 (SUTURE) ×2 IMPLANT
SUT VIC AB 1 CTX 36 (SUTURE) ×12
SUT VIC AB 1 CTX36XBRD ANBCTR (SUTURE) ×4 IMPLANT
SUT VIC AB 2-0 CT1 27 (SUTURE)
SUT VIC AB 2-0 CT1 TAPERPNT 27 (SUTURE) IMPLANT
SUT VIC AB 2-0 CTX 27 (SUTURE) ×2 IMPLANT
SUT VIC AB 3-0 SH 27 (SUTURE) ×4
SUT VIC AB 3-0 SH 27X BRD (SUTURE) IMPLANT
SUT VIC AB 3-0 X1 27 (SUTURE) IMPLANT
SUTURE E-PAK OPEN HEART (SUTURE) ×4 IMPLANT
SYSTEM SAHARA CHEST DRAIN ATS (WOUND CARE) ×4 IMPLANT
TAPE CLOTH SURG 4X10 WHT LF (GAUZE/BANDAGES/DRESSINGS) ×4 IMPLANT
TOWEL OR 17X24 6PK STRL BLUE (TOWEL DISPOSABLE) ×8 IMPLANT
TOWEL OR 17X26 10 PK STRL BLUE (TOWEL DISPOSABLE) ×8 IMPLANT
TRAY FOLEY IC TEMP SENS 16FR (CATHETERS) ×4 IMPLANT
TUBING INSUFFLATION (TUBING) ×4 IMPLANT
UNDERPAD 30X30 INCONTINENT (UNDERPADS AND DIAPERS) ×4 IMPLANT
WATER STERILE IRR 1000ML POUR (IV SOLUTION) ×8 IMPLANT

## 2015-09-22 NOTE — Procedures (Signed)
Extubation Procedure Note  Patient Details:   Name: [name] [name] DOB: 09-Mar-1929 MRN: [id number]   Airway Documentation:     Evaluation  O2 sats: stable throughout Complications: No apparent complications Patient did tolerate procedure well. Bilateral Breath Sounds: Clear, Diminished   Yes pt able to hoarsely vocalize name and DOB and cough to clear secretions. Pt had NIF of -30 and VC of 650 and positive for cuff leak before extubation. Placed pt on nasal cannula at 5L and is tolerating well at this time. RT to monitor as needed  Virgilio Frees 09/22/2015, 8:11 PM

## 2015-09-22 NOTE — Progress Notes (Signed)
The patient was examined and preop studies reviewed. There has been no change from the prior exam and the patient is ready for surgery. Plan CABG on M [place]

## 2015-09-22 NOTE — Anesthesia Procedure Notes (Addendum)
Central Venous Catheter Insertion Performed by: anesthesiologist Patient location: Pre-op. Preanesthetic checklist: patient identified, IV checked, site marked, risks and benefits discussed, surgical consent, monitors and equipment checked, pre-op evaluation, timeout performed and anesthesia consent Position: Trendelenburg Lidocaine 1% used for infiltration Landmarks identified and Seldinger technique used Catheter size: 9 Fr Central line and PA cath was placed.MAC introducer Swan type and PA catheter depth:thermodilation and 50PA Cath depth:50 Procedure performed using ultrasound guided technique. Attempts: 1 Following insertion, line sutured, dressing applied and Biopatch. Post procedure assessment: blood return through all ports, no air and free fluid flow. Patient tolerated the procedure well with no immediate complications.   Procedure Name: Intubation Date/Time: 09/22/2015 8:10 AM Performed by: [place] Pink Pre-anesthesia Checklist: Patient identified, Emergency Drugs available, Suction available, Patient being monitored and Timeout performed Patient Re-evaluated:Patient Re-evaluated prior to inductionOxygen Delivery Method: Circle system utilized Preoxygenation: Pre-oxygenation with 100% oxygen Intubation Type: IV induction Ventilation: Oral airway inserted - appropriate to patient size and Two handed mask ventilation required Laryngoscope Size: Glidescope and 4 Grade View: Grade I Tube type: Subglottic suction tube Tube size: 7.0 mm Number of attempts: 1 Airway Equipment and Method: Video-laryngoscopy Placement Confirmation: ETT inserted through vocal cords under direct vision,  positive ETCO2 and breath sounds checked- equal and bilateral Secured at: 22 cm Tube secured with: Tape Dental Injury: Teeth and Oropharynx as per pre-operative assessment  Comments: Prior partial laryngectomy, elective utilization of glidescope

## 2015-09-22 NOTE — Progress Notes (Signed)
Dr. Roxan Hockey paged and notified of acceptable parameters for extubation except o2 sat of 91% on ABG. Order received to proceed with extubation. Will continue to monitor closely. Eleonore Chiquito RN 2 [place]

## 2015-09-22 NOTE — OR Nursing (Signed)
1340 first call made to SICU, 1422 second call made to SICU

## 2015-09-22 NOTE — Progress Notes (Signed)
      [address]       [place] [address]             [phone]      S/p CABG  BP 97/71 mmHg  Pulse 91  Temp(Src) 98.1 F (36.7 C) (Core (Comment))  Resp 13  Ht 5\' 10"  (1.778 m)  Wt 182 lb 1.6 oz (82.6 kg)  BMI 26.13 kg/m2  SpO2 100%  IABP at 1:1 CI= 2.2   Intake/Output Summary (Last 24 hours) at 09/22/15 1901 Last data filed at 09/22/15 1800  Gross per 24 hour  Intake 7037.72 ml  Output   3495 ml  Net 3542.72 ml    Intubated but starting to wake up  Doing well early postop  Remo Lipps C. Roxan Hockey, MD [facility] [phone]

## 2015-09-22 NOTE — Transfer of Care (Signed)
Immediate Anesthesia Transfer of Care Note  Patient: [name] [name]  Procedure(s) Performed: Procedure(s): CORONARY ARTERY BYPASS GRAFTING (CABG) TIMES  THREE  USING LEFT INTERNAL MAMMARY ARTERY AND RIGHT SAPHENOUS VEIN HARVESTED ENDOSCOPICALLY, CORONARY ENDARTERECTOMY (N/A) TRANSESOPHAGEAL ECHOCARDIOGRAM (TEE) (N/A)  Patient Location: ICU  Anesthesia Type:General  Level of Consciousness: sedated  Airway & Oxygen Therapy: patient placed on ventilator by RRT  Post-op Assessment: Report given to RN and Post -op Vital signs reviewed and stable  Post vital signs: Reviewed and stable  Last Vitals:  Filed Vitals:   09/22/15 0700 09/22/15 0713  BP: 131/64   Pulse: 62   Temp:  37.2 C  Resp: 19     Last Pain:  Filed Vitals:   09/22/15 1517  PainSc: 0-No pain      Patients Stated Pain Goal: 2 ([id number])  Complications: No apparent anesthesia complications

## 2015-09-22 NOTE — Anesthesia Postprocedure Evaluation (Signed)
Anesthesia Post Note  Patient: [name] [name]  Procedure(s) Performed: Procedure(s) (LRB): CORONARY ARTERY BYPASS GRAFTING (CABG) TIMES  THREE  USING LEFT INTERNAL MAMMARY ARTERY AND RIGHT SAPHENOUS VEIN HARVESTED ENDOSCOPICALLY, CORONARY ENDARTERECTOMY (N/A) TRANSESOPHAGEAL ECHOCARDIOGRAM (TEE) (N/A)  Patient location during evaluation: SICU Anesthesia Type: General Level of consciousness: sedated and patient remains intubated per anesthesia plan Pain management: pain level controlled Vital Signs Assessment: post-procedure vital signs reviewed and stable Respiratory status: patient on ventilator - see flowsheet for VS and patient remains intubated per anesthesia plan Anesthetic complications: no    Last Vitals:  Filed Vitals:   09/22/15 1500 09/22/15 1600  BP: 100/53 95/59  Pulse:    Temp:    Resp:      Last Pain:  Filed Vitals:   09/22/15 1622  PainSc: 0-No pain                 Nolon Nations

## 2015-09-22 NOTE — Progress Notes (Signed)
  Echocardiogram 2D Echocardiogram has been performed.  Diamond Nickel 09/22/2015, 9:17 AM

## 2015-09-22 NOTE — Progress Notes (Addendum)
ANTICOAGULATION CONSULT NOTE - Follow Up Consult  Pharmacy Consult for heparin Indication: IABP  Labs:  Recent Labs  09/19/15 0312  09/20/15 0245  09/21/15 0400 09/21/15 0404 09/21/15 1533 09/22/15 0035  HGB 10.6*  --  10.6*  --  11.0*  --   --   --   HCT 32.4*  --  31.7*  --  33.1*  --   --   --   PLT 233  --  247  --  225  --   --   --   HEPARINUNFRC 0.57  < >  --   < >  --  0.29* 0.28* 0.32  CREATININE 1.00  --  1.14  --  0.87  --   --   --   < > = values in this interval not displayed.   Assessment/Plan:  82yo male therapeutic on heparin after rate increase, lower goal now w/ IABP. Will continue gtt at current rate and f/u after CABG.   Wynona Neat, PharmD, BCPS  09/22/2015,1:24 AM

## 2015-09-22 NOTE — Progress Notes (Signed)
CT surgery  IABP advanced 3 cm based on postop CXR P [employer]

## 2015-09-22 NOTE — Brief Op Note (Signed)
09/17/2015 - 09/22/2015  7:04 PM  PATIENT:  [name] [name]  82 y.o. male  PRE-OPERATIVE DIAGNOSIS:  CAD  POST-OPERATIVE DIAGNOSIS:  CAD  PROCEDURE:  Procedure(s): CORONARY ARTERY BYPASS GRAFTING (CABG) TIMES  THREE  USING LEFT INTERNAL MAMMARY ARTERY AND RIGHT SAPHENOUS VEIN HARVESTED ENDOSCOPICALLY, CORONARY ENDARTERECTOMY (N/A) TRANSESOPHAGEAL ECHOCARDIOGRAM (TEE) (N/A) [place] Axillary arter cannulation SURGEON:  Surgeon(s) and Role:    * Ivin Poot, MD - Primary  PHYSICIAN ASSISTANT:Raul Villarreal SA   ASSISTANTS: Ellyn Hack RNFA   ANESTHESIA:   general  EBL:     BLOOD ADMINISTERED:none  DRAINS: 2 MT 1 LPT  LOCAL MEDICATIONS USED:  NONE  SPECIMEN:  Excision LAD plaque  DISPOSITION OF SPECIMEN:  PATHOLOGY  COUNTS:  YES  TOURNIQUET:  * No tourniquets in log *  DICTATION: .Dragon Dictation  PLAN OF CARE: return to 2  [place] ICU  PATIENT DISPOSITION:  ICU - intubated and hemodynamically stable.   Delay start of Pharmacological VTE agent (>24hrs) due to surgical blood loss or risk of bleeding: yes

## 2015-09-23 ENCOUNTER — Inpatient Hospital Stay (HOSPITAL_COMMUNITY): Payer: Medicare HMO

## 2015-09-23 ENCOUNTER — Encounter (HOSPITAL_COMMUNITY): Payer: Self-pay | Admitting: Cardiothoracic Surgery

## 2015-09-23 DIAGNOSIS — Z951 Presence of aortocoronary bypass graft: Secondary | ICD-10-CM

## 2015-09-23 LAB — GLUCOSE, CAPILLARY
GLUCOSE-CAPILLARY: 128 mg/dL — AB (ref 65–99)
GLUCOSE-CAPILLARY: 108 mg/dL — AB (ref 65–99)
GLUCOSE-CAPILLARY: 106 mg/dL — AB (ref 65–99)
GLUCOSE-CAPILLARY: 102 mg/dL — AB (ref 65–99)
GLUCOSE-CAPILLARY: 88 mg/dL (ref 65–99)
GLUCOSE-CAPILLARY: 112 mg/dL — AB (ref 65–99)
GLUCOSE-CAPILLARY: 111 mg/dL — AB (ref 65–99)
GLUCOSE-CAPILLARY: 107 mg/dL — AB (ref 65–99)
GLUCOSE-CAPILLARY: 111 mg/dL — AB (ref 65–99)
GLUCOSE-CAPILLARY: 138 mg/dL — AB (ref 65–99)
GLUCOSE-CAPILLARY: 120 mg/dL — AB (ref 65–99)
GLUCOSE-CAPILLARY: 172 mg/dL — AB (ref 65–99)
Glucose-Capillary: 103 mg/dL — ABNORMAL HIGH (ref 65–99)
Glucose-Capillary: 106 mg/dL — ABNORMAL HIGH (ref 65–99)
Glucose-Capillary: 110 mg/dL — ABNORMAL HIGH (ref 65–99)
Glucose-Capillary: 114 mg/dL — ABNORMAL HIGH (ref 65–99)
Glucose-Capillary: 123 mg/dL — ABNORMAL HIGH (ref 65–99)
Glucose-Capillary: 131 mg/dL — ABNORMAL HIGH (ref 65–99)
Glucose-Capillary: 138 mg/dL — ABNORMAL HIGH (ref 65–99)
Glucose-Capillary: 118 mg/dL — ABNORMAL HIGH (ref 65–99)
Glucose-Capillary: 123 mg/dL — ABNORMAL HIGH (ref 65–99)
Glucose-Capillary: 127 mg/dL — ABNORMAL HIGH (ref 65–99)

## 2015-09-23 LAB — PREPARE FRESH FROZEN PLASMA
UNIT DIVISION: 0
Unit division: 0

## 2015-09-23 LAB — BASIC METABOLIC PANEL
ANION GAP: 5 (ref 5–15)
BUN: 11 mg/dL (ref 6–20)
CALCIUM: 7.7 mg/dL — AB (ref 8.9–10.3)
CO2: 24 mmol/L (ref 22–32)
Chloride: 105 mmol/L (ref 101–111)
Creatinine, Ser: 0.93 mg/dL (ref 0.61–1.24)
GFR calc non Af Amer: >60 mL/min (ref >60)
Glucose, Bld: 100 mg/dL — ABNORMAL HIGH (ref 65–99)
POTASSIUM: 4 mmol/L (ref 3.5–5.1)
Sodium: 134 mmol/L — ABNORMAL LOW (ref 135–145)

## 2015-09-23 LAB — POCT I-STAT, CHEM 8
BUN: 12 mg/dL (ref 6–20)
CREATININE: 0.8 mg/dL (ref 0.61–1.24)
Calcium, Ion: 1.14 mmol/L (ref 1.12–1.23)
Chloride: 100 mmol/L — ABNORMAL LOW (ref 101–111)
GLUCOSE: 129 mg/dL — AB (ref 65–99)
HCT: 25 % — ABNORMAL LOW (ref 39.0–52.0)
HEMOGLOBIN: 8.5 g/dL — AB (ref 13.0–17.0)
Potassium: 4.2 mmol/L (ref 3.5–5.1)
Sodium: 135 mmol/L (ref 135–145)
TCO2: 24 mmol/L (ref 0–100)

## 2015-09-23 LAB — MAGNESIUM
Magnesium: 2.5 mg/dL — ABNORMAL HIGH (ref 1.7–2.4)
Magnesium: 2.5 mg/dL — ABNORMAL HIGH (ref 1.7–2.4)

## 2015-09-23 LAB — CBC
HEMATOCRIT: 27.8 % — AB (ref 39.0–52.0)
HEMATOCRIT: 27.3 % — AB (ref 39.0–52.0)
HEMOGLOBIN: 9.2 g/dL — AB (ref 13.0–17.0)
Hemoglobin: 8.9 g/dL — ABNORMAL LOW (ref 13.0–17.0)
MCH: 29.5 pg (ref 26.0–34.0)
MCH: 29.6 pg (ref 26.0–34.0)
MCHC: 33.1 g/dL (ref 30.0–36.0)
MCHC: 32.6 g/dL (ref 30.0–36.0)
MCV: 89.1 fL (ref 78.0–100.0)
MCV: 90.7 fL (ref 78.0–100.0)
Platelets: 207 10*3/uL (ref 150–400)
Platelets: 214 10*3/uL (ref 150–400)
RBC: 3.12 MIL/uL — AB (ref 4.22–5.81)
RBC: 3.01 MIL/uL — AB (ref 4.22–5.81)
RDW: 13.6 % (ref 11.5–15.5)
RDW: 13.7 % (ref 11.5–15.5)
WBC: 10.8 10*3/uL — AB (ref 4.0–10.5)
WBC: 11.5 10*3/uL — AB (ref 4.0–10.5)

## 2015-09-23 LAB — PREPARE PLATELET PHERESIS: Unit division: 0

## 2015-09-23 LAB — CREATININE, SERUM
Creatinine, Ser: 0.93 mg/dL (ref 0.61–1.24)
GFR calc non Af Amer: >60 mL/min (ref >60)

## 2015-09-23 MED ORDER — SODIUM CHLORIDE 0.9% FLUSH: 10.0000 mL | INTRAVENOUS | Status: DC | PRN

## 2015-09-23 MED ORDER — AMIODARONE HCL IN DEXTROSE 360-4.14 MG/200ML-% IV SOLN
60.0000 mg/h | INTRAVENOUS | Status: DC
  Administered 2015-09-23 – 2015-09-24 (×2): 60 mg/h via INTRAVENOUS
  Filled 2015-09-23: qty 200

## 2015-09-23 MED ORDER — SODIUM CHLORIDE 0.9% FLUSH
10.0000 mL | Freq: Two times a day (BID) | INTRAVENOUS | Status: DC
  Administered 2015-09-24 – 2015-09-26 (×6): 10 mL via INTRAVENOUS

## 2015-09-23 MED ORDER — AMIODARONE HCL IN DEXTROSE 360-4.14 MG/200ML-% IV SOLN
30.0000 mg/h | INTRAVENOUS | Status: DC
  Administered 2015-09-24: 30 mg/h via INTRAVENOUS
  Filled 2015-09-23: qty 200

## 2015-09-23 MED ORDER — AMIODARONE HCL IN DEXTROSE 360-4.14 MG/200ML-% IV SOLN
INTRAVENOUS | Status: AC
  Filled 2015-09-23: qty 200

## 2015-09-23 MED ORDER — INSULIN ASPART 100 UNIT/ML ~~LOC~~ SOLN
0.0000 [IU] | SUBCUTANEOUS | Status: DC
  Administered 2015-09-23: 4 [IU] via SUBCUTANEOUS
  Administered 2015-09-24: 2 [IU] via SUBCUTANEOUS

## 2015-09-23 MED ORDER — INSULIN DETEMIR 100 UNIT/ML ~~LOC~~ SOLN
20.0000 [IU] | Freq: Two times a day (BID) | SUBCUTANEOUS | Status: DC
  Administered 2015-09-23 – 2015-09-24 (×3): 20 [IU] via SUBCUTANEOUS
  Filled 2015-09-23 (×6): qty 0.2

## 2015-09-23 MED ORDER — INSULIN DETEMIR 100 UNIT/ML ~~LOC~~ SOLN: 20.0000 [IU] | Freq: Two times a day (BID) | SUBCUTANEOUS | Status: DC

## 2015-09-23 MED ORDER — POTASSIUM CHLORIDE 10 MEQ/50ML IV SOLN
10.0000 meq | INTRAVENOUS | Status: AC
  Administered 2015-09-23 (×2): 10 meq via INTRAVENOUS

## 2015-09-23 MED ORDER — FUROSEMIDE 10 MG/ML IJ SOLN
40.0000 mg | Freq: Once | INTRAMUSCULAR | Status: AC
  Administered 2015-09-23: 40 mg via INTRAVENOUS
  Filled 2015-09-23: qty 4

## 2015-09-23 NOTE — Progress Notes (Signed)
1 Day Post-Op Procedure(s) (LRB): CORONARY ARTERY BYPASS GRAFTING (CABG) TIMES  THREE  USING LEFT INTERNAL MAMMARY ARTERY AND RIGHT SAPHENOUS VEIN HARVESTED ENDOSCOPICALLY, CORONARY ENDARTERECTOMY (N/A) TRANSESOPHAGEAL ECHOCARDIOGRAM (TEE) (N/A) Subjective: C/o incisional pain  Objective: Vital signs in last 24 hours: Temp:  [96.4 F (35.8 C)-99 F (37.2 C)] 98.1 F (36.7 C) (07/01 0730) Pulse Rate:  [45-195] 84 (07/01 0600) Cardiac Rhythm:  [-] Normal sinus rhythm (07/01 0715) Resp:  [0-26] 16 (07/01 0730) BP: (95-114)/(47-79) 104/72 mmHg (07/01 0700) SpO2:  [92 %-100 %] 97 % (07/01 0715) Arterial Line BP: (93-138)/(31-70) 120/61 mmHg (07/01 0730) FiO2 (%):  [40 %-70 %] 40 % (06/30 1929) Weight:  [186 lb 11.7 oz (84.7 kg)] 186 lb 11.7 oz (84.7 kg) (07/01 0500)  Hemodynamic parameters for last 24 hours: PAP: (25-41)/(12-24) 29/15 mmHg CO:  [4 L/min-5.9 L/min] 5.9 L/min CI:  [2.3 L/min/m2-3.3 L/min/m2] 3.3 L/min/m2  Intake/Output from previous day: 06/30 0701 - 07/01 0700 In: 6753.5 [I.V.:4827.5; Blood:1266; NG/GT:60; IV Piggyback:600] Out: [id number] [Urine:2590; Blood:700; Chest Tube:560] Intake/Output this shift: Total I/O In: 7.4 [I.V.:7.4] Out: 0   General appearance: alert and cooperative Neurologic: nonfocal Heart: regular rate and rhythm Lungs: diminished breath sounds bibasilar Abdomen: normal findings: soft, non-tender  Lab Results:  Recent Labs  09/22/15 2105 09/22/15 2110 09/23/15 0352  WBC 11.9*  --  10.8*  HGB 9.6* 9.5* 9.2*  HCT 28.7* 28.0* 27.8*  PLT 226  --  207   BMET:  Recent Labs  09/22/15 2105 09/22/15 2110 09/23/15 0352  NA 133* 135 134*  K 4.2 4.3 4.0  CL 104 100* 105  CO2 25  --  24  GLUCOSE 110* 110* 100*  BUN 12 13 11   CREATININE 0.83 0.80 0.93  CALCIUM 7.7*  --  7.7*    PT/INR:  Recent Labs  09/22/15 1521  LABPROT 19.3*  INR 1.62*   ABG    Component Value Date/Time   PHART 7.367 09/22/2015 2119   HCO3 23.1 09/22/2015  2119   TCO2 24 09/22/2015 2119   ACIDBASEDEF 2.0 09/22/2015 2119   O2SAT 94.0 09/22/2015 2119   CBG (last 3)   Recent Labs  09/23/15 0503 09/23/15 0606 09/23/15 0719  GLUCAP 88 112* 111*    Assessment/Plan: S/P Procedure(s) (LRB): CORONARY ARTERY BYPASS GRAFTING (CABG) TIMES  THREE  USING LEFT INTERNAL MAMMARY ARTERY AND RIGHT SAPHENOUS VEIN HARVESTED ENDOSCOPICALLY, CORONARY ENDARTERECTOMY (N/A) TRANSESOPHAGEAL ECHOCARDIOGRAM (TEE) (N/A) POD # 1 CABG  CV- good hemodynamics- normal PAP, CI= 3.3  Wean and dc IABP  Continue dopamine and milrinone  RESP- IS for atelectasis  RENAL- creatinine normal  ENDO- CBG Ok- transition to levemir + SSI  Anemia secondary to ABL- follow   LOS: 5 days    Melrose Nakayama 09/23/2015

## 2015-09-23 NOTE — Progress Notes (Addendum)
40cc IABP present in right femoral artery. Slight brusing present medial and lateral to insertion site. IABP removed, manual pressure applied for 30 minutes. Hemostasis achieved. No S+S of hematoma, groin level 0. Site soft and no additional bruising noted.   Bedrest instructions given. Tegaderm dressing applied.  Distal pulses present bilaterally with doppler.  Bedrest begins at 11:20:00.

## 2015-09-23 NOTE — Progress Notes (Signed)
      [address]       [place] [address]             [phone]      Up in chair  BP 144/82 mmHg  Pulse 103  Temp(Src) 99.7 F (37.6 C) (Core (Comment))  Resp 20  Ht 5\' 10"  (1.778 m)  Wt 186 lb 11.7 oz (84.7 kg)  BMI 26.79 kg/m2  SpO2 96%  Just went into atrial fib with rate 105, BP OK   Intake/Output Summary (Last 24 hours) at 09/23/15 1812 Last data filed at 09/23/15 1800  Gross per 24 hour  Intake 2162.1 ml  Output   1850 ml  Net  312.1 ml    Creatinine 0.93, K= 4.2, HCT= 27  Diurese Atrial fib- will try treating with IV lopressor, start amiodarone if it persists  Remo Lipps C. Roxan Hockey, MD [facility] [phone]

## 2015-09-24 ENCOUNTER — Inpatient Hospital Stay (HOSPITAL_COMMUNITY): Payer: Medicare HMO

## 2015-09-24 LAB — GLUCOSE, CAPILLARY
GLUCOSE-CAPILLARY: 138 mg/dL — AB (ref 65–99)
GLUCOSE-CAPILLARY: 141 mg/dL — AB (ref 65–99)
GLUCOSE-CAPILLARY: 150 mg/dL — AB (ref 65–99)
Glucose-Capillary: 149 mg/dL — ABNORMAL HIGH (ref 65–99)
Glucose-Capillary: 123 mg/dL — ABNORMAL HIGH (ref 65–99)
Glucose-Capillary: 111 mg/dL — ABNORMAL HIGH (ref 65–99)
Glucose-Capillary: 104 mg/dL — ABNORMAL HIGH (ref 65–99)

## 2015-09-24 LAB — BASIC METABOLIC PANEL
ANION GAP: 6 (ref 5–15)
BUN: 17 mg/dL (ref 6–20)
CALCIUM: 7.8 mg/dL — AB (ref 8.9–10.3)
CO2: 25 mmol/L (ref 22–32)
CREATININE: 1.05 mg/dL (ref 0.61–1.24)
Chloride: 102 mmol/L (ref 101–111)
GFR calc Af Amer: >60 mL/min (ref >60)
GFR calc non Af Amer: >60 mL/min (ref >60)
GLUCOSE: 140 mg/dL — AB (ref 65–99)
Potassium: 4.2 mmol/L (ref 3.5–5.1)
Sodium: 133 mmol/L — ABNORMAL LOW (ref 135–145)

## 2015-09-24 LAB — CBC
HCT: 25.8 % — ABNORMAL LOW (ref 39.0–52.0)
HEMOGLOBIN: 8.4 g/dL — AB (ref 13.0–17.0)
MCH: 29.2 pg (ref 26.0–34.0)
MCHC: 32.6 g/dL (ref 30.0–36.0)
MCV: 89.6 fL (ref 78.0–100.0)
Platelets: 225 10*3/uL (ref 150–400)
RBC: 2.88 MIL/uL — ABNORMAL LOW (ref 4.22–5.81)
RDW: 13.6 % (ref 11.5–15.5)
WBC: 11.1 10*3/uL — ABNORMAL HIGH (ref 4.0–10.5)

## 2015-09-24 LAB — CARBOXYHEMOGLOBIN
Carboxyhemoglobin: 1.1 % (ref 0.5–1.5)
Methemoglobin: 1 % (ref 0.0–1.5)
O2 Saturation: 62.1 %
Total hemoglobin: 8.5 g/dL — ABNORMAL LOW (ref 13.5–18.0)

## 2015-09-24 MED ORDER — ALUM & MAG HYDROXIDE-SIMETH 200-200-20 MG/5ML PO SUSP
30.0000 mL | Freq: Four times a day (QID) | ORAL | Status: DC | PRN
  Administered 2015-09-24: 30 mL via ORAL
  Filled 2015-09-24: qty 30

## 2015-09-24 MED ORDER — INSULIN ASPART 100 UNIT/ML ~~LOC~~ SOLN
0.0000 [IU] | Freq: Three times a day (TID) | SUBCUTANEOUS | Status: DC
  Administered 2015-09-24 (×2): 2 [IU] via SUBCUTANEOUS
  Administered 2015-09-27: 3 [IU] via SUBCUTANEOUS
  Administered 2015-09-28: 2 [IU] via SUBCUTANEOUS

## 2015-09-24 MED ORDER — FUROSEMIDE 10 MG/ML IJ SOLN
40.0000 mg | Freq: Two times a day (BID) | INTRAMUSCULAR | Status: DC
  Administered 2015-09-24 – 2015-09-25 (×3): 40 mg via INTRAVENOUS
  Filled 2015-09-24 (×3): qty 4

## 2015-09-24 NOTE — Op Note (Signed)
NAMEORLA, ALLRED NO.:  [id number]  MEDICAL RECORD NO.:  [id number]  LOCATION:  [id number]                        FACILITY:  [facility]  PHYSICIAN:  Ivin Poot, M.D.  DATE OF BIRTH:  01-01-1929  DATE OF PROCEDURE:  09/22/2015 DATE OF DISCHARGE:                              OPERATIVE REPORT   OPERATION: 1. Coronary artery bypass grafting x3 (left internal mammary artery to     left anterior descending artery, saphenous vein graft to distal     right coronary artery, saphenous vein graft to circumflex     marginal). 2. Coronary endarterectomy of the left anterior descending. 3. 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 4. Right lower extremity endoscopic vein harvest.  PREOPERATIVE DIAGNOSES:  Severe left main and three-vessel coronary artery disease with unstable angina, preoperative intra-aortic balloon pump, moderate left ventricular dysfunction, calcified ascending aorta.  POSTOPERATIVE DIAGNOSES:  Severe left main and three-vessel coronary artery disease with unstable angina, preoperative intra-aortic balloon pump, calcified ascending aorta, moderate left ventricular dysfunction with mild aortic insufficiency.  SURGEON:  Ivin Poot, M.D.  ASSISTANT:  Valentina Gu, surgical assistant.  ANESTHESIA:  General by Dr. Marcha Dutton.  CLINICAL NOTE:  The patient is an 82 year old, Caucasian male, who presents to the hospital with unstable angina and positive cardiac enzymes consistent with non-ST-elevation MI.  Echocardiogram showed mild to moderate LV dysfunction with mild dilatation of the ascending aorta. Cardiac catheterization demonstrated severe left main stenosis and 3- vessel coronary artery disease with poor distal targets.  The targets were suboptimal because of the diffuse disease pattern.  The patient required IV nitroglycerin and IV heparin but persisted to have chest pain.  A balloon pump was placed  by the cardiologist and the patient stabilized.  He was brought to the operating room, pain free with stable hemodynamics.  Prior to surgery, I discussed the procedure of multivessel CABG with the patient and his family.  I discussed the indications and expected benefits of the procedure.  We discussed the location of the surgical incisions and use of general anesthesia and cardiopulmonary bypass.  I discussed the potential risks to the patient of CABG including risks of stroke, MI, bleeding, blood transfusion requirement, postoperative infection, postoperative pulmonary problems including prolonged ventilator dependence or postoperative pleural effusions, postoperative arrhythmias including heart block requiring permanent pacemaker placement, and death.  After reviewing these issues, he demonstrated his understanding and agreed to proceed with surgery under what I felt was an informed consent.  OPERATIVE FINDINGS: 1. Severely calcified ascending aorta which made it impossible to     cross-clamp in the usual position.  I felt that it was dangerous to     place an aortic cannula in his ascending aorta, so the right     axillary artery was used for arterial inflow cannulation. 2. Severe coronary artery disease necessitating a formal     endarterectomy of the LAD in order to establish a bypass graft     using the left IMA. 3. Improved global LV function after separating from cardiopulmonary     bypass. 4. Adequate quality of conduit.  OPERATIVE PROCEDURE:  The patient was  brought to the operating room directly from the ICU and placed supine on the operating room table.  He was continued on intra-aortic balloon pump support.  The patient was anesthetized under invasive hemodynamic monitoring.  A transesophageal echo probe was placed by the anesthesiologist.  The abdomen, chest and legs were prepped with Betadine and draped as a sterile field.  A proper time-out was performed.  A  sternal incision was made and the saphenous vein was harvested endoscopically from the right leg.  The sternum was opened and the pericardium was opened.  The ascending aorta was examined and palpated and inspected.  It was totally calcified all the way up to the arch.  There was no where to place the aortic cannula or to safely clamp the aorta in the mid to distal ascending aorta.  The plan for the surgery was then altered.  A right subclavicular incision was made and the right axillary artery was exposed surrounded with vessel loops, and the patient was given 6000 units of heparin.  The axillary artery was clamped proximally and distally and an arteriotomy was performed.  A combined graft-aortic cannula was then sewn end-to-side with running 5-0 Prolene and the clamps were removed.  Next, attention was directed to the mediastinum.  A pursestring was placed in the right atrium.  Heparin was administered and the ACT was documented as being therapeutic.  The venous cannula was then placed in the right atrium.  We placed a cardioplegia cannula in the coronary sinus through a small pursestring in the right atrium and also a pledgeted suture in the aorta at the sino-tubular junction just above the aortic valve where the aorta was not calcified.  The mid vein and mammary artery were prepared for the distal anastomoses.  The patient was placed on cardiopulmonary bypass.  The coronaries were identified for grafting.  The distal RCA was found to be heavily diseased but adequate.  The distal posterior descending was not graftable.  The LAD was diffusely diseased.  The circumflex marginal was also heavily diseased but graftable.  The patient was cooled to 32 degrees and a clamp was placed at the sinotubular junction just above the cardioplegia cannula.  This was done carefully to avoid embolization and stroke.  A liter of cold blood cardioplegia was delivered in split doses between the  antegrade aortic and retrograde coronary sinus catheters.  There was good cardioplegic arrest, and septal temperature dropped less than 14 degrees.  Cardioplegia was delivered every 20 minutes or less while the cross-clamp was in place.  The distal coronary anastomoses were performed.  The first distal anastomosis was at the distal RCA.  This was heavily diseased with thickened wall and proximal 80% stenosis.  A reverse saphenous vein was sewn end-to-side with running 7-0 Prolene with good flow through the graft.  Cardioplegia was redosed.  The vein was approximately 2-2.5 mm in diameter, slightly small.  The second distal anastomosis was to the circumflex marginal.  This was a 1.5-mm vessel and proximal 99% stenosis.  A reverse saphenous vein was sewn end-to-side with running 7-0 Prolene.  There was good flow through the graft.  Cardioplegia was redosed.  The third distal anastomosis was to the distal third of the LAD.  An arteriotomy was made.  This was difficult and there was no clear lumen. For that reason, a formal endarterectomy of the LAD was performed.  The plaque was dissected from the adventitia.  The plaque was divided in the mid part of the  arteriotomy.  The proximal plaque was then carefully teased from the proximal vessel using the spatula and continuous pressure.  This removed a long plaque with a feathered end which was sent to pathology.  Next, the plaque extending distally was then carefully teased from the adventitia going distally.  Again, a long plaque was removed with the feathered end and this was also submitted to pathology.  The artery was then irrigated with saline.  The IMA pedicle was brought through an opening in the left lateral pericardium, it was brought down onto the LAD and sewn end-to-side with running 8-0 Prolene.  After the anastomosis was completed, the bulldog was removed temporarily on the mammary pedicle and there was good flow through the  anastomosis and this was hemostatic.  The bulldog was reapplied and the pedicle was secured to the epicardium with 6-0 Prolene sutures.  Cardioplegia was redosed.  While the clamp was still in place, a single venous proximal anastomosis of the circumflex vein was sewn end-to-side with running 6-0 Prolene. The air was vented from the coronaries with a dose of retrograde warm blood cardioplegia and the cross-clamp was removed. The vein graft to the RCA was then sewn in a vein to vein anastomosis just beyond the aortic proximal anastomosis.  This was completed with 2 bulldog clamps on the vein grafts and running 7-0 Prolene.  The bulldog clamps were all removed and there was good flow down each vein which originated from 1 single aortic proximal anastomosis.  The patient was rewarmed and reperfused.  Temporary pacing wires were applied.  The proximal and distal anastomoses were checked and found to be hemostatic.  The lungs were expanded, ventilator was resumed.  The balloon pump was then re-initiated with full augmentation.  The patient was placed on low-dose milrinone and dopamine.  He was weaned from cardiopulmonary bypass without difficulty.  Global LV function appeared to be improved.  The cannulas were removed.  Protamine was administered slowly without adverse reaction.  The coagulation parameters were checked and his INR was prolonged and so the patient was given 2 FFPs. This improved coagulation function.  The superior pericardial fat was closed over the aorta and vein grafts.  The Gore-Tex graft sewn end-to- side to the right axillary artery was stapled with a hemostatic staple line.  This incision was closed in layers using Vicryl and skin staples.  An anterior mediastinal and left pleural chest tubes were placed and brought out through separate incisions.  The patient remained stable.  The sternum was closed with interrupted steel wire.  The pectoralis fascia was closed with  a running #1 Vicryl.  The subcutaneous and skin layers were closed with running Vicryl and sterile dressings were applied.  Total cardiopulmonary bypass time was 130 minutes.     Ivin Poot, M.D.     PV/MEDQ  D:  09/24/2015  T:  09/24/2015  Job:  [id number]  cc:   Shelva Majestic, M.D.

## 2015-09-24 NOTE — Progress Notes (Signed)
2 Days Post-Op Procedure(s) (LRB): CORONARY ARTERY BYPASS GRAFTING (CABG) TIMES  THREE  USING LEFT INTERNAL MAMMARY ARTERY AND RIGHT SAPHENOUS VEIN HARVESTED ENDOSCOPICALLY, CORONARY ENDARTERECTOMY (N/A) TRANSESOPHAGEAL ECHOCARDIOGRAM (TEE) (N/A) Subjective: C/o indigestion  Objective: Vital signs in last 24 hours: Temp:  [97.6 F (36.4 C)-99.7 F (37.6 C)] 98.2 F (36.8 C) (07/02 0700) Pulse Rate:  [64-115] 75 (07/02 0700) Cardiac Rhythm:  [-] Atrial fibrillation (07/02 0100) Resp:  [11-29] 20 (07/02 0700) BP: (89-144)/(42-82) 141/77 mmHg (07/02 0700) SpO2:  [90 %-99 %] 95 % (07/02 0700) Arterial Line BP: (94-167)/(28-72) 153/63 mmHg (07/02 0700) Weight:  [192 lb 10.9 oz (87.4 kg)] 192 lb 10.9 oz (87.4 kg) (07/02 0600)  Hemodynamic parameters for last 24 hours: PAP: (27-40)/(9-25) 37/19 mmHg CO:  [4.8 L/min-5.6 L/min] 4.8 L/min CI:  [2.7 L/min/m2-3 L/min/m2] 2.7 L/min/m2  Intake/Output from previous day: 07/01 0701 - 07/02 0700 In: 1707 [I.V.:1307; IV Piggyback:400] Out: 1090 [Urine:800; Chest Tube:290] Intake/Output this shift:    General appearance: alert, cooperative and no distress Neurologic: intact Heart: regular rate and rhythm Lungs: diminished breath sounds bibasilar Abdomen: normal findings: soft, non-tender  Lab Results:  Recent Labs  09/23/15 1615 09/24/15 0502  WBC 11.5* 11.1*  HGB 8.9* 8.4*  HCT 27.3* 25.8*  PLT 214 225   BMET:  Recent Labs  09/23/15 0352 09/23/15 1556 09/23/15 1615 09/24/15 0502  NA 134* 135  --  133*  K 4.0 4.2  --  4.2  CL 105 100*  --  102  CO2 24  --   --  25  GLUCOSE 100* 129*  --  140*  BUN 11 12  --  17  CREATININE 0.93 0.80 0.93 1.05  CALCIUM 7.7*  --   --  7.8*    PT/INR:  Recent Labs  09/22/15 1521  LABPROT 19.3*  INR 1.62*   ABG    Component Value Date/Time   PHART 7.367 09/22/2015 2119   HCO3 23.1 09/22/2015 2119   TCO2 24 09/23/2015 1556   ACIDBASEDEF 2.0 09/22/2015 2119   O2SAT 62.1  09/24/2015 0450   CBG (last 3)   Recent Labs  09/23/15 2037 09/24/15 0012 09/24/15 0351  GLUCAP 172* 150* 138*    Assessment/Plan: S/P Procedure(s) (LRB): CORONARY ARTERY BYPASS GRAFTING (CABG) TIMES  THREE  USING LEFT INTERNAL MAMMARY ARTERY AND RIGHT SAPHENOUS VEIN HARVESTED ENDOSCOPICALLY, CORONARY ENDARTERECTOMY (N/A) TRANSESOPHAGEAL ECHOCARDIOGRAM (TEE) (N/A) -  CV- IABP out, co-ox 62 on milrinone and dopamine- will try to slowly wean dopamine today  Atrial fib- converted to SR with amiodarone  RESP- IS for atelectasis, still some CHF on CXR - diurese  RENAL- creatinine normal- diurese, watch sodium  Keep Foley today  ENDO- CBG mildly elevated- change to AC/ HS  mobilize   LOS: 6 days    Melrose Nakayama 09/24/2015

## 2015-09-24 NOTE — Progress Notes (Signed)
      [address]       [place] [address]             [phone]      Up in chair  Stable day, no major issues  Off dopamine  Remo Lipps C. Roxan Hockey, MD [facility] [phone]

## 2015-09-25 ENCOUNTER — Inpatient Hospital Stay (HOSPITAL_COMMUNITY): Payer: Medicare HMO

## 2015-09-25 LAB — CARBOXYHEMOGLOBIN
CARBOXYHEMOGLOBIN: 1.5 % (ref 0.5–1.5)
Methemoglobin: 0.9 % (ref 0.0–1.5)
O2 SAT: 65.9 %
TOTAL HEMOGLOBIN: 6.7 g/dL — AB (ref 13.5–18.0)

## 2015-09-25 LAB — GLUCOSE, CAPILLARY
GLUCOSE-CAPILLARY: 101 mg/dL — AB (ref 65–99)
Glucose-Capillary: 101 mg/dL — ABNORMAL HIGH (ref 65–99)
Glucose-Capillary: 106 mg/dL — ABNORMAL HIGH (ref 65–99)

## 2015-09-25 LAB — POCT I-STAT, CHEM 8
BUN: 22 mg/dL — AB (ref 6–20)
Calcium, Ion: 1.09 mmol/L — ABNORMAL LOW (ref 1.12–1.23)
Chloride: 95 mmol/L — ABNORMAL LOW (ref 101–111)
Creatinine, Ser: 1.1 mg/dL (ref 0.61–1.24)
Glucose, Bld: 97 mg/dL (ref 65–99)
HEMATOCRIT: 23 % — AB (ref 39.0–52.0)
Hemoglobin: 7.8 g/dL — ABNORMAL LOW (ref 13.0–17.0)
Potassium: 4.3 mmol/L (ref 3.5–5.1)
SODIUM: 133 mmol/L — AB (ref 135–145)
TCO2: 27 mmol/L (ref 0–100)

## 2015-09-25 LAB — CBC
HEMATOCRIT: 23.9 % — AB (ref 39.0–52.0)
Hemoglobin: 7.8 g/dL — ABNORMAL LOW (ref 13.0–17.0)
MCH: 29.2 pg (ref 26.0–34.0)
MCHC: 32.6 g/dL (ref 30.0–36.0)
MCV: 89.5 fL (ref 78.0–100.0)
PLATELETS: 223 10*3/uL (ref 150–400)
RBC: 2.67 MIL/uL — ABNORMAL LOW (ref 4.22–5.81)
RDW: 13.7 % (ref 11.5–15.5)
WBC: 9.2 10*3/uL (ref 4.0–10.5)

## 2015-09-25 LAB — BASIC METABOLIC PANEL
Anion gap: 17 — ABNORMAL HIGH (ref 5–15)
BUN: 21 mg/dL — AB (ref 6–20)
CALCIUM: 8.4 mg/dL — AB (ref 8.9–10.3)
CHLORIDE: 94 mmol/L — AB (ref 101–111)
CO2: 26 mmol/L (ref 22–32)
CREATININE: 1.11 mg/dL (ref 0.61–1.24)
GFR calc non Af Amer: 59 mL/min — ABNORMAL LOW (ref >60)
GLUCOSE: 94 mg/dL (ref 65–99)
Potassium: 3.3 mmol/L — ABNORMAL LOW (ref 3.5–5.1)
Sodium: 137 mmol/L (ref 135–145)

## 2015-09-25 MED ORDER — AMIODARONE LOAD VIA INFUSION
150.0000 mg | Freq: Once | INTRAVENOUS | Status: DC
  Filled 2015-09-25: qty 83.34

## 2015-09-25 MED ORDER — POTASSIUM CHLORIDE 10 MEQ/50ML IV SOLN
10.0000 meq | INTRAVENOUS | Status: AC
  Filled 2015-09-25: qty 50

## 2015-09-25 MED ORDER — SODIUM CHLORIDE 0.9% FLUSH
10.0000 mL | INTRAVENOUS | Status: DC | PRN
Start: 2015-09-25 — End: 2015-09-30
  Administered 2015-09-29: 10 mL
  Filled 2015-09-25: qty 40

## 2015-09-25 MED ORDER — SODIUM CHLORIDE 0.9% FLUSH
10.0000 mL | Freq: Two times a day (BID) | INTRAVENOUS | Status: DC
  Administered 2015-09-25 – 2015-09-28 (×7): 10 mL

## 2015-09-25 MED ORDER — ENOXAPARIN SODIUM 40 MG/0.4ML ~~LOC~~ SOLN
40.0000 mg | SUBCUTANEOUS | Status: DC
  Administered 2015-09-25 – 2015-09-30 (×6): 40 mg via SUBCUTANEOUS
  Filled 2015-09-25 (×7): qty 0.4

## 2015-09-25 MED ORDER — POTASSIUM CHLORIDE 10 MEQ/50ML IV SOLN
10.0000 meq | INTRAVENOUS | Status: AC
  Administered 2015-09-25 (×3): 10 meq via INTRAVENOUS
  Filled 2015-09-25 (×3): qty 50

## 2015-09-25 MED ORDER — FUROSEMIDE 10 MG/ML IJ SOLN
40.0000 mg | Freq: Every day | INTRAMUSCULAR | Status: DC
  Administered 2015-09-26: 40 mg via INTRAVENOUS
  Filled 2015-09-25: qty 4

## 2015-09-25 MED ORDER — FE FUMARATE-B12-VIT C-FA-IFC PO CAPS
1.0000 | ORAL_CAPSULE | Freq: Three times a day (TID) | ORAL | Status: DC
  Administered 2015-09-25 – 2015-09-29 (×14): 1 via ORAL
  Filled 2015-09-25 (×13): qty 1

## 2015-09-25 MED ORDER — POTASSIUM CHLORIDE 10 MEQ/50ML IV SOLN
10.0000 meq | INTRAVENOUS | Status: AC
  Administered 2015-09-25 (×3): 10 meq via INTRAVENOUS
  Filled 2015-09-25 (×2): qty 50

## 2015-09-25 MED ORDER — AMIODARONE HCL 200 MG PO TABS
200.0000 mg | ORAL_TABLET | Freq: Every day | ORAL | Status: DC
  Administered 2015-09-25 – 2015-09-27 (×3): 200 mg via ORAL
  Filled 2015-09-25 (×3): qty 1

## 2015-09-25 MED FILL — Sodium Chloride IV Soln 0.9%: INTRAVENOUS | Qty: 2000 | Status: AC

## 2015-09-25 MED FILL — Heparin Sodium (Porcine) Inj 1000 Unit/ML: INTRAMUSCULAR | Qty: 10 | Status: AC

## 2015-09-25 MED FILL — Mannitol IV Soln 20%: INTRAVENOUS | Qty: 500 | Status: AC

## 2015-09-25 MED FILL — Lidocaine HCl IV Inj 20 MG/ML: INTRAVENOUS | Qty: 5 | Status: AC

## 2015-09-25 MED FILL — Electrolyte-R (PH 7.4) Solution: INTRAVENOUS | Qty: 3000 | Status: AC

## 2015-09-25 MED FILL — Sodium Bicarbonate IV Soln 8.4%: INTRAVENOUS | Qty: 50 | Status: AC

## 2015-09-25 NOTE — Progress Notes (Signed)
3 Days Post-Op Procedure(s) (LRB): CORONARY ARTERY BYPASS GRAFTING (CABG) TIMES  THREE  USING LEFT INTERNAL MAMMARY ARTERY AND RIGHT SAPHENOUS VEIN HARVESTED ENDOSCOPICALLY, CORONARY ENDARTERECTOMY (N/A) TRANSESOPHAGEAL ECHOCARDIOGRAM (TEE) (N/A) Subjective: Status post multivessel CABG for ischemic cardiomyopathy, preoperative unstable angina and heart failure. Preoperative balloon pump Patient successfully weaned off milrinone Maintaining sinus rhythm atrially paced with by mouth amiodarone Plan on transfer to stepdown tomorrow  Objective: Vital signs in last 24 hours: Temp:  [97.4 F (36.3 C)-98.3 F (36.8 C)] 98.3 F (36.8 C) (07/03 1556) Pulse Rate:  [63-160] 80 (07/03 1500) Cardiac Rhythm:  [-] Normal sinus rhythm (07/03 1300) Resp:  [13-31] 15 (07/03 1500) BP: (90-146)/(48-84) 114/60 mmHg (07/03 1500) SpO2:  [81 %-99 %] 99 % (07/03 1500) Weight:  [190 lb 14.7 oz (86.6 kg)] 190 lb 14.7 oz (86.6 kg) (07/03 0500)  Hemodynamic parameters for last 24 hours:    Intake/Output from previous day: 07/02 0701 - 07/03 0700 In: 1647.7 [P.O.:720; I.V.:927.7] Out: 1750 [Urine:1680; Chest Tube:70] Intake/Output this shift: Total I/O In: 377.4 [I.V.:227.4; IV Piggyback:150] Out: 1150 [Urine:1150]  Lungs clear Heart rate regular Minimal edema Neuro intact Right hand with good pulse, strong grip  Lab Results:  Recent Labs  09/24/15 0502 09/25/15 0340 09/25/15 1658  WBC 11.1* 9.2  --   HGB 8.4* 7.8* 7.8*  HCT 25.8* 23.9* 23.0*  PLT 225 223  --    BMET:  Recent Labs  09/24/15 0502 09/25/15 0340 09/25/15 1658  NA 133* 137 133*  K 4.2 3.3* 4.3  CL 102 94* 95*  CO2 25 26  --   GLUCOSE 140* 94 97  BUN 17 21* 22*  CREATININE 1.05 1.11 1.10  CALCIUM 7.8* 8.4*  --     PT/INR: No results for input(s): LABPROT, INR in the last 72 hours. ABG    Component Value Date/Time   PHART 7.367 09/22/2015 2119   HCO3 23.1 09/22/2015 2119   TCO2 27 09/25/2015 1658   ACIDBASEDEF  2.0 09/22/2015 2119   O2SAT 65.9 09/25/2015 0349   CBG (last 3)   Recent Labs  09/24/15 2153 09/25/15 0710 09/25/15 1154  GLUCAP 104* 101* 101*    Assessment/Plan: S/P Procedure(s) (LRB): CORONARY ARTERY BYPASS GRAFTING (CABG) TIMES  THREE  USING LEFT INTERNAL MAMMARY ARTERY AND RIGHT SAPHENOUS VEIN HARVESTED ENDOSCOPICALLY, CORONARY ENDARTERECTOMY (N/A) TRANSESOPHAGEAL ECHOCARDIOGRAM (TEE) (N/A) Mobilize Diuresis By mouth amiodarone 200 daily   LOS: 7 days    Tharon Aquas Trigt III 09/25/2015

## 2015-09-25 NOTE — Progress Notes (Signed)
Peripherally Inserted Central Catheter/Midline Placement  The IV Nurse has discussed with the patient and/or persons authorized to consent for the patient, the purpose of this procedure and the potential benefits and risks involved with this procedure.  The benefits include less needle sticks, lab draws from the catheter and patient may be discharged home with the catheter.  Risks include, but not limited to, infection, bleeding, blood clot (thrombus formation), and puncture of an artery; nerve damage and irregular heat beat.  Alternatives to this procedure were also discussed.  PICC/Midline Placement Documentation        [name] [name] 09/25/2015, 10:45 AM

## 2015-09-25 NOTE — Progress Notes (Addendum)
Subjective:  20  Objective:  Temp:  [97.8 F (36.6 C)-98.6 F (37 C)] 98.3 F (36.8 C) (07/03 0714) Pulse Rate:  [54-80] 75 (07/03 0700) Resp:  [13-31] 15 (07/03 0700) BP: (90-145)/(48-85) 120/51 mmHg (07/03 0700) SpO2:  [81 %-100 %] 81 % (07/03 0700) Arterial Line BP: (131-136)/(44-48) 132/48 mmHg (07/02 0900) Weight:  [190 lb 14.7 oz (86.6 kg)] 190 lb 14.7 oz (86.6 kg) (07/03 0500) Weight change: -1 lb 12.2 oz (-0.8 kg)  Intake/Output from previous day: 07/02 0701 - 07/03 0700 In: 1647.7 [P.O.:720; I.V.:927.7] Out: 1750 [Urine:1680; Chest Tube:70]  Intake/Output from this shift:    Physical Exam: General appearance: alert and no distress Neck: no adenopathy, no carotid bruit, no JVD, supple, symmetrical, trachea midline and thyroid not enlarged, symmetric, no tenderness/mass/nodules Lungs: clear to auscultation bilaterally Heart: irregularly irregular rhythm Extremities: extremities normal, atraumatic, no cyanosis or edema  Lab Results: Results for orders placed or performed during the hospital encounter of 09/17/15 (from the past 48 hour(s))  Glucose, capillary     Status: Abnormal   Collection Time: 09/23/15  8:29 AM  Result Value Ref Range   Glucose-Capillary 118 (H) 65 - 99 mg/dL   Comment 1 Arterial Specimen   Glucose, capillary     Status: Abnormal   Collection Time: 09/23/15  9:39 AM  Result Value Ref Range   Glucose-Capillary 111 (H) 65 - 99 mg/dL  Glucose, capillary     Status: Abnormal   Collection Time: 09/23/15 11:07 AM  Result Value Ref Range   Glucose-Capillary 138 (H) 65 - 99 mg/dL   Comment 1 Arterial Specimen   Glucose, capillary     Status: Abnormal   Collection Time: 09/23/15 12:12 PM  Result Value Ref Range   Glucose-Capillary 127 (H) 65 - 99 mg/dL   Comment 1 Arterial Specimen   Glucose, capillary     Status: Abnormal   Collection Time: 09/23/15  2:33 PM  Result Value Ref Range   Glucose-Capillary 120 (H) 65 - 99 mg/dL  Glucose,  capillary     Status: Abnormal   Collection Time: 09/23/15  3:44 PM  Result Value Ref Range   Glucose-Capillary 123 (H) 65 - 99 mg/dL   Comment 1 Notify RN   I-STAT, chem 8     Status: Abnormal   Collection Time: 09/23/15  3:56 PM  Result Value Ref Range   Sodium 135 135 - 145 mmol/L   Potassium 4.2 3.5 - 5.1 mmol/L   Chloride 100 (L) 101 - 111 mmol/L   BUN 12 6 - 20 mg/dL   Creatinine, Ser 0.80 0.61 - 1.24 mg/dL   Glucose, Bld 129 (H) 65 - 99 mg/dL   Calcium, Ion 1.14 1.12 - 1.23 mmol/L   TCO2 24 0 - 100 mmol/L   Hemoglobin 8.5 (L) 13.0 - 17.0 g/dL   HCT 25.0 (L) 39.0 - 52.0 %  Magnesium     Status: Abnormal   Collection Time: 09/23/15  4:15 PM  Result Value Ref Range   Magnesium 2.5 (H) 1.7 - 2.4 mg/dL  CBC     Status: Abnormal   Collection Time: 09/23/15  4:15 PM  Result Value Ref Range   WBC 11.5 (H) 4.0 - 10.5 K/uL   RBC 3.01 (L) 4.22 - 5.81 MIL/uL   Hemoglobin 8.9 (L) 13.0 - 17.0 g/dL   HCT 27.3 (L) 39.0 - 52.0 %   MCV 90.7 78.0 - 100.0 fL   MCH 29.6 26.0 - 34.0  pg   MCHC 32.6 30.0 - 36.0 g/dL   RDW 13.7 11.5 - 15.5 %   Platelets 214 150 - 400 K/uL  Creatinine, serum     Status: None   Collection Time: 09/23/15  4:15 PM  Result Value Ref Range   Creatinine, Ser 0.93 0.61 - 1.24 mg/dL   GFR calc non Af Amer >60 >60 mL/min   GFR calc Af Amer >60 >60 mL/min    Comment: (NOTE) The eGFR has been calculated using the CKD EPI equation. This calculation has not been validated in all clinical situations. eGFR's persistently <60 mL/min signify possible Chronic Kidney Disease.   Glucose, capillary     Status: Abnormal   Collection Time: 09/23/15  8:37 PM  Result Value Ref Range   Glucose-Capillary 172 (H) 65 - 99 mg/dL   Comment 1 Capillary Specimen    Comment 2 Notify RN   Glucose, capillary     Status: Abnormal   Collection Time: 09/24/15 12:12 AM  Result Value Ref Range   Glucose-Capillary 150 (H) 65 - 99 mg/dL   Comment 1 Capillary Specimen    Comment 2 Notify  RN   Glucose, capillary     Status: Abnormal   Collection Time: 09/24/15  3:51 AM  Result Value Ref Range   Glucose-Capillary 138 (H) 65 - 99 mg/dL   Comment 1 Capillary Specimen    Comment 2 Notify RN   Carboxyhemoglobin     Status: Abnormal   Collection Time: 09/24/15  4:50 AM  Result Value Ref Range   Total hemoglobin 8.5 (L) 13.5 - 18.0 g/dL   O2 Saturation 62.1 %   Carboxyhemoglobin 1.1 0.5 - 1.5 %   Methemoglobin 1.0 0.0 - 1.5 %  Basic metabolic panel     Status: Abnormal   Collection Time: 09/24/15  5:02 AM  Result Value Ref Range   Sodium 133 (L) 135 - 145 mmol/L   Potassium 4.2 3.5 - 5.1 mmol/L   Chloride 102 101 - 111 mmol/L   CO2 25 22 - 32 mmol/L   Glucose, Bld 140 (H) 65 - 99 mg/dL   BUN 17 6 - 20 mg/dL   Creatinine, Ser 1.05 0.61 - 1.24 mg/dL   Calcium 7.8 (L) 8.9 - 10.3 mg/dL   GFR calc non Af Amer >60 >60 mL/min   GFR calc Af Amer >60 >60 mL/min    Comment: (NOTE) The eGFR has been calculated using the CKD EPI equation. This calculation has not been validated in all clinical situations. eGFR's persistently <60 mL/min signify possible Chronic Kidney Disease.    Anion gap 6 5 - 15  CBC     Status: Abnormal   Collection Time: 09/24/15  5:02 AM  Result Value Ref Range   WBC 11.1 (H) 4.0 - 10.5 K/uL   RBC 2.88 (L) 4.22 - 5.81 MIL/uL   Hemoglobin 8.4 (L) 13.0 - 17.0 g/dL   HCT 25.8 (L) 39.0 - 52.0 %   MCV 89.6 78.0 - 100.0 fL   MCH 29.2 26.0 - 34.0 pg   MCHC 32.6 30.0 - 36.0 g/dL   RDW 13.6 11.5 - 15.5 %   Platelets 225 150 - 400 K/uL  Glucose, capillary     Status: Abnormal   Collection Time: 09/24/15  7:45 AM  Result Value Ref Range   Glucose-Capillary 141 (H) 65 - 99 mg/dL  Glucose, capillary     Status: Abnormal   Collection Time: 09/24/15 11:15 AM  Result Value Ref  Range   Glucose-Capillary 149 (H) 65 - 99 mg/dL   Comment 1 Notify RN   Glucose, capillary     Status: Abnormal   Collection Time: 09/24/15  3:36 PM  Result Value Ref Range    Glucose-Capillary 123 (H) 65 - 99 mg/dL   Comment 1 Notify RN   Glucose, capillary     Status: Abnormal   Collection Time: 09/24/15  5:34 PM  Result Value Ref Range   Glucose-Capillary 111 (H) 65 - 99 mg/dL   Comment 1 Notify RN   Glucose, capillary     Status: Abnormal   Collection Time: 09/24/15  9:53 PM  Result Value Ref Range   Glucose-Capillary 104 (H) 65 - 99 mg/dL   Comment 1 Capillary Specimen    Comment 2 Notify RN    Comment 3 Document in Chart   CBC     Status: Abnormal   Collection Time: 09/25/15  3:40 AM  Result Value Ref Range   WBC 9.2 4.0 - 10.5 K/uL   RBC 2.67 (L) 4.22 - 5.81 MIL/uL   Hemoglobin 7.8 (L) 13.0 - 17.0 g/dL   HCT 23.9 (L) 39.0 - 52.0 %   MCV 89.5 78.0 - 100.0 fL   MCH 29.2 26.0 - 34.0 pg   MCHC 32.6 30.0 - 36.0 g/dL   RDW 13.7 11.5 - 15.5 %   Platelets 223 150 - 400 K/uL  Basic metabolic panel     Status: Abnormal   Collection Time: 09/25/15  3:40 AM  Result Value Ref Range   Sodium 137 135 - 145 mmol/L   Potassium 3.3 (L) 3.5 - 5.1 mmol/L    Comment: DELTA CHECK NOTED   Chloride 94 (L) 101 - 111 mmol/L   CO2 26 22 - 32 mmol/L   Glucose, Bld 94 65 - 99 mg/dL   BUN 21 (H) 6 - 20 mg/dL   Creatinine, Ser 1.11 0.61 - 1.24 mg/dL   Calcium 8.4 (L) 8.9 - 10.3 mg/dL   GFR calc non Af Amer 59 (L) >60 mL/min   GFR calc Af Amer >60 >60 mL/min    Comment: (NOTE) The eGFR has been calculated using the CKD EPI equation. This calculation has not been validated in all clinical situations. eGFR's persistently <60 mL/min signify possible Chronic Kidney Disease.    Anion gap 17 (H) 5 - 15  Carboxyhemoglobin     Status: Abnormal   Collection Time: 09/25/15  3:49 AM  Result Value Ref Range   Total hemoglobin 6.7 (LL) 13.5 - 18.0 g/dL    Comment: CRITICAL RESULT CALLED TO, READ BACK BY AND VERIFIED WITH:  [employer] RN AT [place] RRT,RCP ON 09/25/2015    O2 Saturation 65.9 %   Carboxyhemoglobin 1.5 0.5 - 1.5 %   Methemoglobin 0.9 0.0 - 1.5 %    Glucose, capillary     Status: Abnormal   Collection Time: 09/25/15  7:10 AM  Result Value Ref Range   Glucose-Capillary 101 (H) 65 - 99 mg/dL   Comment 1 Capillary Specimen    Comment 2 Notify RN     Imaging: Imaging results have been reviewed Interstitial edema (I personally reviewed)   Tele- Afib with CVR  Assessment/Plan:   1. Principal Problem: 2.   Non-STEMI (non-ST elevated myocardial infarction) ([place]) 3. Active Problems: 4.   Hypertension 5.   GERD (gastroesophageal reflux disease) 6.   NSTEMI (non-ST elevated myocardial infarction) ([place]) 7.   ST elevation myocardial infarction (  STEMI) ([place]) 8.   ST elevation (STEMI) myocardial infarction of unspecified site ([place]) 9.   Coronary artery disease involving native coronary artery of native heart without angina pectoris 10.   Postinfarction angina ([place]) 11.   CAD (coronary artery disease) 12.   S/P CABG (coronary artery bypass graft) 13.   Time Spent Directly with Patient:  20 minutes  Length of Stay:  LOS: 7 days   POD #3 CABG X 3  For 3VD and EF 25-30% (ISCM). On low dose milrinone. I/O + 3L. Looks great. Ambulated around unit. Alert and oriented. Exam benign.VSS.  He is in Afib this AM (PAF), PO Amio started by Dr PVT. Otherwise nl progression. Gentle diuresis per TCTS.    Quay Burow 09/25/2015, 8:16 AM

## 2015-09-25 NOTE — Evaluation (Signed)
Physical Therapy Evaluation Patient Details Name: [name] [name] MRN: [id number] DOB: May 12, 1928 Today's Date: 09/25/2015   History of Present Illness  Pt admit with NSTEMI.  CABGx3.    Clinical Impression  Pt admitted with above diagnosis. Pt currently with functional limitations due to the deficits listed below (see PT Problem List). Pt was able to ambulate on unit with good balance using device. Will follow acutely. Should progress well.  Has supportive family.  Pt will benefit from skilled PT to increase their independence and safety with mobility to allow discharge to the venue listed below.    Follow Up Recommendations Home health PT;Supervision/Assistance - 24 hour    Equipment Recommendations  None recommended by PT    Recommendations for Other Services       Precautions / Restrictions Precautions Precautions: Sternal;Fall      Mobility  Bed Mobility               General bed mobility comments: sitting on EOB on arrival.   Transfers Overall transfer level: Needs assistance Equipment used: Pushed w/c Transfers: Sit to/from Stand Sit to Stand: Mod assist;+2 safety/equipment         General transfer comment: Needed assist to power up and cues for hugging pillow for sternal precautions.   Ambulation/Gait Ambulation/Gait assistance: Min assist Ambulation Distance (Feet): 450 Feet Assistive device:  (pushing wheelchair) Gait Pattern/deviations: Step-through pattern;Decreased stride length;Drifts right/left   Gait velocity interpretation: <1.8 ft/sec, indicative of risk for recurrent falls General Gait Details: Overall, pt ambulated well.  Needed cues for sequencing steps and wheelchair at times. Got pt settled into chair and pt needed to go to bathroom.  Assisted pt into bathroom with nursing assist and +2 HHA.  Pt needed UE support for balance needing min to mod assist.   Stairs            Wheelchair Mobility    Modified Rankin (Stroke Patients  Only)       Balance Overall balance assessment: Needs assistance Sitting-balance support: No upper extremity supported;Feet supported Sitting balance-Leahy Scale: Fair     Standing balance support: Bilateral upper extremity supported;During functional activity Standing balance-Leahy Scale: Poor Standing balance comment: relies on UE support for balance.                              Pertinent Vitals/Pain Pain Assessment: No/denies painVSS.     Home Living Family/patient expects to be discharged to:: Private residence Living Arrangements: Spouse/significant other Available Help at Discharge: Family;Available 24 hours/day Type of Home: House Home Access: Ramped entrance     Home Layout: One level Home Equipment: Cane - single point;Walker - 2 wheels;Bedside commode;Tub bench      Prior Function Level of Independence: Independent               Hand Dominance        Extremity/Trunk Assessment   Upper Extremity Assessment: Defer to OT evaluation           Lower Extremity Assessment: Generalized weakness      Cervical / Trunk Assessment: Normal  Communication   Communication: No difficulties  Cognition Arousal/Alertness: Awake/alert Behavior During Therapy: WFL for tasks assessed/performed Overall Cognitive Status: Within Functional Limits for tasks assessed                      General Comments General comments (skin integrity, edema, etc.): Pt told this PT that  his son died of CA yesterday.      Exercises        Assessment/Plan    PT Assessment Patient needs continued PT services  PT Diagnosis Generalized weakness   PT Problem List Decreased activity tolerance;Decreased balance;Decreased mobility;Decreased knowledge of use of DME;Decreased safety awareness;Decreased knowledge of precautions  PT Treatment Interventions DME instruction;Gait training;Functional mobility training;Therapeutic activities;Therapeutic  exercise;Balance training;Stair training;Patient/family education   PT Goals (Current goals can be found in the Care Plan section) Acute Rehab PT Goals Patient Stated Goal: to go home PT Goal Formulation: With patient Time For Goal Achievement: 10/09/15 Potential to Achieve Goals: Good    Frequency Min 3X/week   Barriers to discharge        Co-evaluation               End of Session Equipment Utilized During Treatment: Gait belt Activity Tolerance: Patient limited by fatigue Patient left: in chair;with call bell/phone within reach;with family/visitor present Nurse Communication: Mobility status         Time: [id number] PT Time Calculation (min) (ACUTE ONLY): 30 min   Charges:   PT Evaluation $PT Eval Moderate Complexity: 1 Procedure PT Treatments $Gait Training: 8-22 mins   PT G CodesDenice Paradise 10-06-15, 2:55 PM [employer] Acute Rehabilitation [phone] (pager)

## 2015-09-25 NOTE — Care Management Important Message (Signed)
Important Message  Patient Details  Name: [name] [name] MRN: [id number] Date of Birth: [birth year]   Medicare Important Message Given:  Yes    Jael Kostick Abena 09/25/2015, 10:29 AM

## 2015-09-25 NOTE — Progress Notes (Signed)
Dr. Roxan Hockey called and notified of patient's bradycardia. Orders received to stop Amiodarone infusion.    Inis Sizer

## 2015-09-26 ENCOUNTER — Inpatient Hospital Stay (HOSPITAL_COMMUNITY): Payer: Medicare HMO

## 2015-09-26 LAB — TYPE AND SCREEN
ABO/RH(D): O POS
Antibody Screen: NEGATIVE
Unit division: 0
Unit division: 0
Unit division: 0
Unit division: 0
Unit division: 0
Unit division: 0
Unit division: 0
Unit division: 0

## 2015-09-26 LAB — GLUCOSE, CAPILLARY
GLUCOSE-CAPILLARY: 111 mg/dL — AB (ref 65–99)
Glucose-Capillary: 123 mg/dL — ABNORMAL HIGH (ref 65–99)
Glucose-Capillary: 94 mg/dL (ref 65–99)
Glucose-Capillary: 110 mg/dL — ABNORMAL HIGH (ref 65–99)

## 2015-09-26 LAB — COMPREHENSIVE METABOLIC PANEL
ALT: 17 U/L (ref 17–63)
AST: 19 U/L (ref 15–41)
Albumin: 2 g/dL — ABNORMAL LOW (ref 3.5–5.0)
Alkaline Phosphatase: 44 U/L (ref 38–126)
Anion gap: 6 (ref 5–15)
BUN: 21 mg/dL — ABNORMAL HIGH (ref 6–20)
CO2: 26 mmol/L (ref 22–32)
Calcium: 7.6 mg/dL — ABNORMAL LOW (ref 8.9–10.3)
Chloride: 100 mmol/L — ABNORMAL LOW (ref 101–111)
Creatinine, Ser: 1.05 mg/dL (ref 0.61–1.24)
GFR calc Af Amer: >60 mL/min (ref >60)
GFR calc non Af Amer: >60 mL/min (ref >60)
Glucose, Bld: 111 mg/dL — ABNORMAL HIGH (ref 65–99)
Potassium: 4.1 mmol/L (ref 3.5–5.1)
Sodium: 132 mmol/L — ABNORMAL LOW (ref 135–145)
Total Bilirubin: 0.9 mg/dL (ref 0.3–1.2)
Total Protein: 4.9 g/dL — ABNORMAL LOW (ref 6.5–8.1)

## 2015-09-26 LAB — CBC
HCT: 23.8 % — ABNORMAL LOW (ref 39.0–52.0)
Hemoglobin: 7.8 g/dL — ABNORMAL LOW (ref 13.0–17.0)
MCH: 29.9 pg (ref 26.0–34.0)
MCHC: 32.8 g/dL (ref 30.0–36.0)
MCV: 91.2 fL (ref 78.0–100.0)
Platelets: 236 10*3/uL (ref 150–400)
RBC: 2.61 MIL/uL — ABNORMAL LOW (ref 4.22–5.81)
RDW: 14 % (ref 11.5–15.5)
WBC: 8.3 10*3/uL (ref 4.0–10.5)

## 2015-09-26 NOTE — Progress Notes (Signed)
Physical Therapy Treatment Patient Details Name: [name] [name] MRN: [id number] DOB: 1928/11/24 Today's Date: 09/27/15    History of Present Illness Pt admit with NSTEMI.  CABGx3.  PMH - throat CA, prostate CA, HTN    PT Comments    Pt making very good progress.  Follow Up Recommendations  Home health PT;Supervision/Assistance - 24 hour     Equipment Recommendations  None recommended by PT    Recommendations for Other Services       Precautions / Restrictions Precautions Precautions: Sternal;Fall    Mobility  Bed Mobility Overal bed mobility: Needs Assistance Bed Mobility: Sit to Supine       Sit to supine: Min assist   General bed mobility comments: Assist to bring legs up into bed  Transfers Overall transfer level: Needs assistance Equipment used: 4-wheeled walker Transfers: Sit to/from Stand Sit to Stand: Mod assist         General transfer comment: Assist to bring hips up. Had pt place hands on knees.  Ambulation/Gait Ambulation/Gait assistance: Min guard Ambulation Distance (Feet): 500 Feet Assistive device: 4-wheeled walker Gait Pattern/deviations: Decreased step length - right;Decreased stride length     General Gait Details: Pt able to manage rollator without problems   Stairs            Wheelchair Mobility    Modified Rankin (Stroke Patients Only)       Balance Overall balance assessment: Needs assistance Sitting-balance support: No upper extremity supported Sitting balance-Leahy Scale: Fair     Standing balance support: Bilateral upper extremity supported Standing balance-Leahy Scale: Poor Standing balance comment: support of rollator and supervision for static standing                    Cognition Arousal/Alertness: Awake/alert Behavior During Therapy: WFL for tasks assessed/performed Overall Cognitive Status: Within Functional Limits for tasks assessed                      Exercises      General  Comments        Pertinent Vitals/Pain Pain Assessment: No/denies pain    Home Living                      Prior Function            PT Goals (current goals can now be found in the care plan section) Progress towards PT goals: Progressing toward goals    Frequency  Min 3X/week    PT Plan Current plan remains appropriate    Co-evaluation             End of Session Equipment Utilized During Treatment: Gait belt Activity Tolerance: Patient tolerated treatment well Patient left: with call bell/phone within reach;in bed;with bed alarm set     Time: [id number] PT Time Calculation (min) (ACUTE ONLY): 20 min  Charges:  $Gait Training: 8-22 mins                    G Codes:      Denise Bramblett Sep 27, 2015, 12:18 PM [employer] PT [phone]

## 2015-09-26 NOTE — Progress Notes (Signed)
4 Days Post-Op Procedure(s) (LRB): CORONARY ARTERY BYPASS GRAFTING (CABG) TIMES  THREE  USING LEFT INTERNAL MAMMARY ARTERY AND RIGHT SAPHENOUS VEIN HARVESTED ENDOSCOPICALLY, CORONARY ENDARTERECTOMY (N/A) TRANSESOPHAGEAL ECHOCARDIOGRAM (TEE) (N/A) Subjective: No complaints this AM   Objective: Vital signs in last 24 hours: Temp:  [97.4 F (36.3 C)-98.6 F (37 C)] 98.1 F (36.7 C) (07/04 0708) Pulse Rate:  [63-160] 78 (07/04 0700) Cardiac Rhythm:  [-] Normal sinus rhythm (07/04 0740) Resp:  [15-28] 17 (07/04 0700) BP: (113-150)/(50-117) 124/65 mmHg (07/04 0700) SpO2:  [94 %-100 %] 96 % (07/04 0700) Weight:  [189 lb 9.5 oz (86 kg)] 189 lb 9.5 oz (86 kg) (07/04 0500)  Hemodynamic parameters for last 24 hours:    Intake/Output from previous day: 07/03 0701 - 07/04 0700 In: 992.4 [P.O.:240; I.V.:602.4; IV Piggyback:150] Out: 1955 [Urine:1955] Intake/Output this shift:    General appearance: alert, cooperative and no distress Neurologic: intact Heart: regular rate and rhythm Lungs: diminished breath sounds bibasilar Abdomen: normal findings: soft, non-tender  Lab Results:  Recent Labs  09/25/15 0340 09/25/15 1658 09/26/15 0412  WBC 9.2  --  8.3  HGB 7.8* 7.8* 7.8*  HCT 23.9* 23.0* 23.8*  PLT 223  --  236   BMET:  Recent Labs  09/25/15 0340 09/25/15 1658 09/26/15 0412  NA 137 133* 132*  K 3.3* 4.3 4.1  CL 94* 95* 100*  CO2 26  --  26  GLUCOSE 94 97 111*  BUN 21* 22* 21*  CREATININE 1.11 1.10 1.05  CALCIUM 8.4*  --  7.6*    PT/INR: No results for input(s): LABPROT, INR in the last 72 hours. ABG    Component Value Date/Time   PHART 7.367 09/22/2015 2119   HCO3 23.1 09/22/2015 2119   TCO2 27 09/25/2015 1658   ACIDBASEDEF 2.0 09/22/2015 2119   O2SAT 65.9 09/25/2015 0349   CBG (last 3)   Recent Labs  09/25/15 1154 09/25/15 2117 09/26/15 0704  GLUCAP 101* 106* 111*    Assessment/Plan: S/P Procedure(s) (LRB): CORONARY ARTERY BYPASS GRAFTING (CABG)  TIMES  THREE  USING LEFT INTERNAL MAMMARY ARTERY AND RIGHT SAPHENOUS VEIN HARVESTED ENDOSCOPICALLY, CORONARY ENDARTERECTOMY (N/A) TRANSESOPHAGEAL ECHOCARDIOGRAM (TEE) (N/A) - CV- stable, co-ox 66 on 0.2 mcg/kg/min of milrinone- decrease to 0.1  RESP- continue IS  RENAL- creatinine stable, lytes OK  ENDO- CBG well controlled  Continue to mobilize   LOS: 8 days    Melrose Nakayama 09/26/2015

## 2015-09-26 NOTE — Progress Notes (Signed)
      [address]       [place] [address]             [phone]      Visiting with family  BP 144/73 mmHg  Pulse 81  Temp(Src) 98.4 F (36.9 C) (Oral)  Resp 20  Ht 5\' 10"  (1.778 m)  Wt 189 lb 9.5 oz (86 kg)  BMI 27.20 kg/m2  SpO2 97%   Intake/Output Summary (Last 24 hours) at 09/26/15 1813 Last data filed at 09/26/15 1800  Gross per 24 hour  Intake    695 ml  Output   2025 ml  Net  -1330 ml    Tolerating milrinone @ 0.1- will dc , check co-ox in [employer] C. Roxan Hockey, MD [facility] [phone]

## 2015-09-26 NOTE — Progress Notes (Signed)
Subjective:   82 yo man with PMH of GERD, hypertension and vocal cord cancer treated with radiation who presented to [facility] ER with substernal to left-sided chest discomfort that radiates to both arms for 2 hours prior to presentation this evening. He characterized the severity as bad as 9/10 with aching, heavy sensation. Of note, he had a similar episode 09/15/15 that lasted ~ 30 minutes and resolved on its on. Tonight, he took tums and 4 large aspirin with mild relief. Per his family, he has been under a lot of stress with son recently diagnosed with stage IV cancer  Urgent cath showed severe 3 V CAD .   Had CABG on July 2.  Coronary artery bypass grafting x3 (left internal mammary artery to  left anterior descending artery, saphenous vein graft to distal  right coronary artery, saphenous vein graft to circumflex  marginal   On milrinone .  Hemodynamics are stable   Objective:  Temp:  [97.4 F (36.3 C)-98.6 F (37 C)] 98.1 F (36.7 C) (07/04 0708) Pulse Rate:  [63-160] 78 (07/04 0700) Resp:  [15-28] 17 (07/04 0700) BP: (113-150)/(50-117) 124/65 mmHg (07/04 0700) SpO2:  [94 %-100 %] 96 % (07/04 0700) Weight:  [189 lb 9.5 oz (86 kg)] 189 lb 9.5 oz (86 kg) (07/04 0500) Weight change: -1 lb 5.2 oz (-0.6 kg)  Intake/Output from previous day: 07/03 0701 - 07/04 0700 In: 992.4 [P.O.:240; I.V.:602.4; IV Piggyback:150] Out: 1955 [Urine:1955]  Intake/Output from this shift:    Physical Exam: General appearance: alert and no distress Neck: no adenopathy, no carotid bruit, no JVD, supple, symmetrical, trachea midline and thyroid not enlarged, symmetric, no tenderness/mass/nodules Lungs: clear to auscultation bilaterally Heart: irregularly irregular rhythm Extremities: extremities normal, atraumatic, no cyanosis or edema  Lab Results: Results for orders placed or performed during the hospital encounter of 09/17/15 (from the past 48 hour(s))  Glucose, capillary      Status: Abnormal   Collection Time: 09/24/15 11:15 AM  Result Value Ref Range   Glucose-Capillary 149 (H) 65 - 99 mg/dL   Comment 1 Notify RN   Glucose, capillary     Status: Abnormal   Collection Time: 09/24/15  3:36 PM  Result Value Ref Range   Glucose-Capillary 123 (H) 65 - 99 mg/dL   Comment 1 Notify RN   Glucose, capillary     Status: Abnormal   Collection Time: 09/24/15  5:34 PM  Result Value Ref Range   Glucose-Capillary 111 (H) 65 - 99 mg/dL   Comment 1 Notify RN   Glucose, capillary     Status: Abnormal   Collection Time: 09/24/15  9:53 PM  Result Value Ref Range   Glucose-Capillary 104 (H) 65 - 99 mg/dL   Comment 1 Capillary Specimen    Comment 2 Notify RN    Comment 3 Document in Chart   CBC     Status: Abnormal   Collection Time: 09/25/15  3:40 AM  Result Value Ref Range   WBC 9.2 4.0 - 10.5 K/uL   RBC 2.67 (L) 4.22 - 5.81 MIL/uL   Hemoglobin 7.8 (L) 13.0 - 17.0 g/dL   HCT 23.9 (L) 39.0 - 52.0 %   MCV 89.5 78.0 - 100.0 fL   MCH 29.2 26.0 - 34.0 pg   MCHC 32.6 30.0 - 36.0 g/dL   RDW 13.7 11.5 - 15.5 %   Platelets 223 150 - 400 K/uL  Basic metabolic panel     Status: Abnormal  Collection Time: 09/25/15  3:40 AM  Result Value Ref Range   Sodium 137 135 - 145 mmol/L   Potassium 3.3 (L) 3.5 - 5.1 mmol/L    Comment: DELTA CHECK NOTED   Chloride 94 (L) 101 - 111 mmol/L   CO2 26 22 - 32 mmol/L   Glucose, Bld 94 65 - 99 mg/dL   BUN 21 (H) 6 - 20 mg/dL   Creatinine, Ser 1.11 0.61 - 1.24 mg/dL   Calcium 8.4 (L) 8.9 - 10.3 mg/dL   GFR calc non Af Amer 59 (L) >60 mL/min   GFR calc Af Amer >60 >60 mL/min    Comment: (NOTE) The eGFR has been calculated using the CKD EPI equation. This calculation has not been validated in all clinical situations. eGFR's persistently <60 mL/min signify possible Chronic Kidney Disease.    Anion gap 17 (H) 5 - 15  Carboxyhemoglobin     Status: Abnormal   Collection Time: 09/25/15  3:49 AM  Result Value Ref Range   Total  hemoglobin 6.7 (LL) 13.5 - 18.0 g/dL    Comment: CRITICAL RESULT CALLED TO, READ BACK BY AND VERIFIED WITH:  [facility] RN AT [place] RRT,RCP ON 09/25/2015    O2 Saturation 65.9 %   Carboxyhemoglobin 1.5 0.5 - 1.5 %   Methemoglobin 0.9 0.0 - 1.5 %  Glucose, capillary     Status: Abnormal   Collection Time: 09/25/15  7:10 AM  Result Value Ref Range   Glucose-Capillary 101 (H) 65 - 99 mg/dL   Comment 1 Capillary Specimen    Comment 2 Notify RN   Glucose, capillary     Status: Abnormal   Collection Time: 09/25/15 11:54 AM  Result Value Ref Range   Glucose-Capillary 101 (H) 65 - 99 mg/dL   Comment 1 Capillary Specimen    Comment 2 Notify RN   I-STAT, chem 8     Status: Abnormal   Collection Time: 09/25/15  4:58 PM  Result Value Ref Range   Sodium 133 (L) 135 - 145 mmol/L   Potassium 4.3 3.5 - 5.1 mmol/L   Chloride 95 (L) 101 - 111 mmol/L   BUN 22 (H) 6 - 20 mg/dL   Creatinine, Ser 1.10 0.61 - 1.24 mg/dL   Glucose, Bld 97 65 - 99 mg/dL   Calcium, Ion 1.09 (L) 1.12 - 1.23 mmol/L   TCO2 27 0 - 100 mmol/L   Hemoglobin 7.8 (L) 13.0 - 17.0 g/dL   HCT 23.0 (L) 39.0 - 52.0 %  Glucose, capillary     Status: Abnormal   Collection Time: 09/25/15  9:17 PM  Result Value Ref Range   Glucose-Capillary 106 (H) 65 - 99 mg/dL   Comment 1 Capillary Specimen    Comment 2 Notify RN    Comment 3 Document in Chart   CBC     Status: Abnormal   Collection Time: 09/26/15  4:12 AM  Result Value Ref Range   WBC 8.3 4.0 - 10.5 K/uL   RBC 2.61 (L) 4.22 - 5.81 MIL/uL   Hemoglobin 7.8 (L) 13.0 - 17.0 g/dL   HCT 23.8 (L) 39.0 - 52.0 %   MCV 91.2 78.0 - 100.0 fL   MCH 29.9 26.0 - 34.0 pg   MCHC 32.8 30.0 - 36.0 g/dL   RDW 14.0 11.5 - 15.5 %   Platelets 236 150 - 400 K/uL  Comprehensive metabolic panel     Status: Abnormal   Collection Time: 09/26/15  4:12  AM  Result Value Ref Range   Sodium 132 (L) 135 - 145 mmol/L   Potassium 4.1 3.5 - 5.1 mmol/L   Chloride 100 (L) 101 - 111 mmol/L   CO2  26 22 - 32 mmol/L   Glucose, Bld 111 (H) 65 - 99 mg/dL   BUN 21 (H) 6 - 20 mg/dL   Creatinine, Ser 1.05 0.61 - 1.24 mg/dL   Calcium 7.6 (L) 8.9 - 10.3 mg/dL   Total Protein 4.9 (L) 6.5 - 8.1 g/dL   Albumin 2.0 (L) 3.5 - 5.0 g/dL   AST 19 15 - 41 U/L   ALT 17 17 - 63 U/L   Alkaline Phosphatase 44 38 - 126 U/L   Total Bilirubin 0.9 0.3 - 1.2 mg/dL   GFR calc non Af Amer >60 >60 mL/min   GFR calc Af Amer >60 >60 mL/min    Comment: (NOTE) The eGFR has been calculated using the CKD EPI equation. This calculation has not been validated in all clinical situations. eGFR's persistently <60 mL/min signify possible Chronic Kidney Disease.    Anion gap 6 5 - 15  Glucose, capillary     Status: Abnormal   Collection Time: 09/26/15  7:04 AM  Result Value Ref Range   Glucose-Capillary 111 (H) 65 - 99 mg/dL   Comment 1 Capillary Specimen    Comment 2 Notify RN     Imaging: Imaging results have been reviewed Interstitial edema (I personally reviewed)   Tele- Afib with CVR  Assessment/Plan:   Principal Problem:   Non-STEMI (non-ST elevated myocardial infarction) (HCC) Active Problems:   Hypertension   GERD (gastroesophageal reflux disease)   NSTEMI (non-ST elevated myocardial infarction) (HCC)   ST elevation myocardial infarction (STEMI) (HCC)   ST elevation (STEMI) myocardial infarction of unspecified site [facility])   Coronary artery disease involving native coronary artery of native heart without angina pectoris   Postinfarction angina (HCC)   CAD (coronary artery disease)   S/P CABG (coronary artery bypass graft)   Time Spent Directly with Patient:  20 minutes  Length of Stay:  LOS: 8 days   1. CAD :  S/p CABG  Progressing well. Weaning Milrinone   ambulating well - walked around the unit this am I turned the pacer off.   He was not capturing and the pacer was not sensing correctly .     Mertie Moores 09/26/2015, 8:31 AM

## 2015-09-27 LAB — BASIC METABOLIC PANEL
ANION GAP: 9 (ref 5–15)
BUN: 15 mg/dL (ref 6–20)
CO2: 26 mmol/L (ref 22–32)
Calcium: 7.8 mg/dL — ABNORMAL LOW (ref 8.9–10.3)
Chloride: 100 mmol/L — ABNORMAL LOW (ref 101–111)
Creatinine, Ser: 0.89 mg/dL (ref 0.61–1.24)
GFR calc Af Amer: >60 mL/min (ref >60)
GFR calc non Af Amer: >60 mL/min (ref >60)
GLUCOSE: 111 mg/dL — AB (ref 65–99)
POTASSIUM: 3.7 mmol/L (ref 3.5–5.1)
Sodium: 135 mmol/L (ref 135–145)

## 2015-09-27 LAB — GLUCOSE, CAPILLARY
GLUCOSE-CAPILLARY: 109 mg/dL — AB (ref 65–99)
GLUCOSE-CAPILLARY: 158 mg/dL — AB (ref 65–99)
Glucose-Capillary: 101 mg/dL — ABNORMAL HIGH (ref 65–99)
Glucose-Capillary: 125 mg/dL — ABNORMAL HIGH (ref 65–99)

## 2015-09-27 LAB — CBC
HCT: 25.5 % — ABNORMAL LOW (ref 39.0–52.0)
HEMOGLOBIN: 8.2 g/dL — AB (ref 13.0–17.0)
MCH: 29.1 pg (ref 26.0–34.0)
MCHC: 32.2 g/dL (ref 30.0–36.0)
MCV: 90.4 fL (ref 78.0–100.0)
Platelets: 305 10*3/uL (ref 150–400)
RBC: 2.82 MIL/uL — AB (ref 4.22–5.81)
RDW: 14 % (ref 11.5–15.5)
WBC: 8.7 10*3/uL (ref 4.0–10.5)

## 2015-09-27 LAB — CARBOXYHEMOGLOBIN
CARBOXYHEMOGLOBIN: 1.6 % — AB (ref 0.5–1.5)
METHEMOGLOBIN: 0.8 % (ref 0.0–1.5)
O2 Saturation: 54 %
Total hemoglobin: 7.7 g/dL — ABNORMAL LOW (ref 13.5–18.0)

## 2015-09-27 MED ORDER — OXYCODONE HCL 5 MG PO TABS: 5.0000 mg | ORAL_TABLET | ORAL | Status: DC | PRN

## 2015-09-27 MED ORDER — LEVOTHYROXINE SODIUM 50 MCG PO TABS: 50.0000 ug | ORAL_TABLET | Freq: Every day | ORAL | Status: DC

## 2015-09-27 MED ORDER — POTASSIUM CHLORIDE 10 MEQ/50ML IV SOLN
10.0000 meq | INTRAVENOUS | Status: AC
  Administered 2015-09-27 (×3): 10 meq via INTRAVENOUS
  Filled 2015-09-27 (×3): qty 50

## 2015-09-27 MED ORDER — SODIUM CHLORIDE 0.9% FLUSH: 3.0000 mL | INTRAVENOUS | Status: DC | PRN

## 2015-09-27 MED ORDER — SODIUM CHLORIDE 0.9% FLUSH: 3.0000 mL | Freq: Two times a day (BID) | INTRAVENOUS | Status: DC

## 2015-09-27 MED ORDER — CARVEDILOL 6.25 MG PO TABS
6.2500 mg | ORAL_TABLET | Freq: Two times a day (BID) | ORAL | Status: DC
  Administered 2015-09-27 – 2015-09-30 (×7): 6.25 mg via ORAL
  Filled 2015-09-27 (×7): qty 1

## 2015-09-27 MED ORDER — POTASSIUM CHLORIDE CRYS ER 20 MEQ PO TBCR
20.0000 meq | EXTENDED_RELEASE_TABLET | Freq: Every day | ORAL | Status: DC
  Administered 2015-09-28 – 2015-09-29 (×2): 20 meq via ORAL
  Filled 2015-09-27: qty 1

## 2015-09-27 MED ORDER — MOVING RIGHT ALONG BOOK
Freq: Once | Status: AC
  Administered 2015-09-27: 09:00:00
  Filled 2015-09-27: qty 1

## 2015-09-27 MED ORDER — SODIUM CHLORIDE 0.9 % IV SOLN: 250.0000 mL | INTRAVENOUS | Status: DC | PRN

## 2015-09-27 MED ORDER — LISINOPRIL 5 MG PO TABS
5.0000 mg | ORAL_TABLET | Freq: Every day | ORAL | Status: DC
  Administered 2015-09-27 – 2015-09-30 (×4): 5 mg via ORAL
  Filled 2015-09-27 (×5): qty 1

## 2015-09-27 MED ORDER — FUROSEMIDE 10 MG/ML IJ SOLN
40.0000 mg | Freq: Every day | INTRAMUSCULAR | Status: AC
  Administered 2015-09-27: 40 mg via INTRAVENOUS
  Filled 2015-09-27: qty 4

## 2015-09-27 MED ORDER — MAGNESIUM HYDROXIDE 400 MG/5ML PO SUSP
30.0000 mL | Freq: Every day | ORAL | Status: DC | PRN
  Administered 2015-09-27: 30 mL via ORAL
  Filled 2015-09-27: qty 30

## 2015-09-27 MED ORDER — SODIUM CHLORIDE 0.9% FLUSH
3.0000 mL | Freq: Two times a day (BID) | INTRAVENOUS | Status: DC
  Administered 2015-09-27: 3 mL via INTRAVENOUS

## 2015-09-27 MED ORDER — SORBITOL 70 % PO SOLN
30.0000 mL | Freq: Every day | ORAL | Status: DC | PRN
  Administered 2015-09-27: 30 mL via ORAL
  Filled 2015-09-27 (×2): qty 30

## 2015-09-27 MED ORDER — FUROSEMIDE 20 MG PO TABS: 20.0000 mg | ORAL_TABLET | Freq: Every day | ORAL | Status: DC

## 2015-09-27 MED ORDER — SORBITOL 70 % PO SOLN: 30.0000 mL | Freq: Once | ORAL | Status: DC

## 2015-09-27 NOTE — Progress Notes (Addendum)
5 Days Post-Op Procedure(s) (LRB): CORONARY ARTERY BYPASS GRAFTING (CABG) TIMES  THREE  USING LEFT INTERNAL MAMMARY ARTERY AND RIGHT SAPHENOUS VEIN HARVESTED ENDOSCOPICALLY, CORONARY ENDARTERECTOMY (N/A) TRANSESOPHAGEAL ECHOCARDIOGRAM (TEE) (N/A) Subjective: CABG with mod LV dysfunction, preop IABP Maintaining NSR on amio 200/day Walking in hall Objective: Vital signs in last 24 hours: Temp:  [98 F (36.7 C)-98.8 F (37.1 C)] 98 F (36.7 C) (07/05 0758) Pulse Rate:  [61-140] 70 (07/05 0730) Cardiac Rhythm:  [-] Normal sinus rhythm (07/05 0630) Resp:  [15-27] 23 (07/05 0730) BP: (107-155)/(63-98) 148/73 mmHg (07/05 0730) SpO2:  [93 %-100 %] 100 % (07/05 0730) Weight:  [186 lb 11.7 oz (84.7 kg)] 186 lb 11.7 oz (84.7 kg) (07/05 0500)  Hemodynamic parameters for last 24 hours:   stable Intake/Output from previous day: 07/04 0701 - 07/05 0700 In: 1330 [P.O.:710; I.V.:520; IV Piggyback:100] Out: 2095 [Urine:2095] Intake/Output this shift: Total I/O In: 50 [IV Piggyback:50] Out: -        Exam    General- alert and comfortable   Lungs- clear without rales, wheezes   Cor- regular rate and rhythm, no murmur , gallop   Abdomen- soft, non-tender   Extremities - warm, non-tender, minimal edema   Neuro- oriented, appropriate, no focal weakness   Lab Results:  Recent Labs  09/26/15 0412 09/27/15 0420  WBC 8.3 8.7  HGB 7.8* 8.2*  HCT 23.8* 25.5*  PLT 236 305   BMET:  Recent Labs  09/26/15 0412 09/27/15 0420  NA 132* 135  K 4.1 3.7  CL 100* 100*  CO2 26 26  GLUCOSE 111* 111*  BUN 21* 15  CREATININE 1.05 0.89  CALCIUM 7.6* 7.8*    PT/INR: No results for input(s): LABPROT, INR in the last 72 hours. ABG    Component Value Date/Time   PHART 7.367 09/22/2015 2119   HCO3 23.1 09/22/2015 2119   TCO2 27 09/25/2015 1658   ACIDBASEDEF 2.0 09/22/2015 2119   O2SAT 54.0 09/27/2015 0428   CBG (last 3)   Recent Labs  09/26/15 1150 09/26/15 1706 09/26/15 2144   GLUCAP 123* 94 110*    Assessment/Plan: S/P Procedure(s) (LRB): CORONARY ARTERY BYPASS GRAFTING (CABG) TIMES  THREE  USING LEFT INTERNAL MAMMARY ARTERY AND RIGHT SAPHENOUS VEIN HARVESTED ENDOSCOPICALLY, CORONARY ENDARTERECTOMY (N/A) TRANSESOPHAGEAL ECHOCARDIOGRAM (TEE) (N/A) tx to telemetry bed Add plavix for LAD endarterectomy and reduce asa to 81 mg - plts > 300k  LOS: 9 days    [name] [name] 09/27/2015

## 2015-09-27 NOTE — Progress Notes (Signed)
Patient arrived to 2W via wheelchair in no apparent distress.  Patient was oriented to room to include phone and call light.  Telemetry monitor was applied and CCMD notified.  Will continue to monitor.

## 2015-09-27 NOTE — Progress Notes (Signed)
CARDIAC REHAB PHASE I   PRE:  Rate/Rhythm: 80 afib    BP: sitting 104/64    SaO2: 96 RA  MODE:  Ambulation: 280 ft   POST:  Rate/Rhythm: 76 afib    BP: sitting 140/64 (after vomiting)     SaO2: 98 RA  Pt needed assist to get to EOB and then stand. Discussed rocking to help his knees. Fairly steady walking however he needed reminder x1 to stay inside RW. Rest x1. On return to room pt became hot and sweaty. Vomited x3 upon sitting (he had eaten fruit for lunch). Felt better afterward. Left pt in [place] chair. He did have urgency to urinate, small amount, bloody. Encouraged pt to sit up for a while. His son's funeral is tomorrow. His wife will be coming to stay the day. [place], ACSM 09/27/2015 1:41 PM

## 2015-09-27 NOTE — Progress Notes (Signed)
Report called to RN on 2W; pt to transfer to 2W07.  [name] [name]

## 2015-09-27 NOTE — Progress Notes (Signed)
Physical Therapy Treatment Patient Details Name: BENEDICT BONEE MRN: [id number] DOB: [birth year] Today's Date: 09/30/15    History of Present Illness Pt admit with NSTEMI.  CABGx3.  PMH - throat CA, prostate CA, HTN    PT Comments    Pt continues to make good progress.  Follow Up Recommendations  Home health PT;Supervision/Assistance - 24 hour     Equipment Recommendations  Other (comment) (possibly rollator)    Recommendations for Other Services       Precautions / Restrictions Precautions Precautions: Sternal;Fall Restrictions Weight Bearing Restrictions: Yes (sternal precautions)    Mobility  Bed Mobility                  Transfers Overall transfer level: Needs assistance Equipment used: 4-wheeled walker Transfers: Sit to/from Stand Sit to Stand: Min assist         General transfer comment: Assist to bring hips up. Had pt place hands on knees.  Ambulation/Gait Ambulation/Gait assistance: Supervision Ambulation Distance (Feet): 310 Feet Assistive device: 4-wheeled walker Gait Pattern/deviations: Step-through pattern;Decreased stride length     General Gait Details: Steady gait with rollator.   Stairs            Wheelchair Mobility    Modified Rankin (Stroke Patients Only)       Balance Overall balance assessment: Needs assistance Sitting-balance support: No upper extremity supported Sitting balance-Leahy Scale: Good     Standing balance support: Bilateral upper extremity supported Standing balance-Leahy Scale: Poor Standing balance comment: support of rollator                    Cognition Arousal/Alertness: Awake/alert Behavior During Therapy: WFL for tasks assessed/performed Overall Cognitive Status: Within Functional Limits for tasks assessed                      Exercises      General Comments        Pertinent Vitals/Pain Pain Assessment: No/denies pain    Home Living                       Prior Function            PT Goals (current goals can now be found in the care plan section) Progress towards PT goals: Progressing toward goals    Frequency  Min 3X/week    PT Plan Current plan remains appropriate    Co-evaluation             End of Session Equipment Utilized During Treatment: Gait belt Activity Tolerance: Patient tolerated treatment well Patient left: in chair;with nursing/sitter in room (nurse tech giving bath)     Time: [phone] PT Time Calculation (min) (ACUTE ONLY): 17 min  Charges:  $Gait Training: 8-22 mins                    G Codes:      Lusero Nordlund 09-30-15, 10:26 AM Suanne Marker PT [phone]

## 2015-09-27 NOTE — Progress Notes (Signed)
Attempted to call report to 2W; RN unable to take report at this time; will try again later.  Ruben Reason

## 2015-09-27 NOTE — Progress Notes (Signed)
Pt transferred to 2W7 via wheelchair; daughter notified of transfer; pt belongings, SCD's, and chart along for transfer; RN and NT in room upon transfer; will cont. To monitor.  [name] [name]

## 2015-09-28 ENCOUNTER — Inpatient Hospital Stay (HOSPITAL_COMMUNITY): Payer: Medicare HMO

## 2015-09-28 DIAGNOSIS — I480 Paroxysmal atrial fibrillation: Secondary | ICD-10-CM

## 2015-09-28 LAB — CBC
HCT: 26.1 % — ABNORMAL LOW (ref 39.0–52.0)
Hemoglobin: 8.3 g/dL — ABNORMAL LOW (ref 13.0–17.0)
MCH: 28.8 pg (ref 26.0–34.0)
MCHC: 31.8 g/dL (ref 30.0–36.0)
MCV: 90.6 fL (ref 78.0–100.0)
Platelets: 374 10*3/uL (ref 150–400)
RBC: 2.88 MIL/uL — ABNORMAL LOW (ref 4.22–5.81)
RDW: 14 % (ref 11.5–15.5)
WBC: 11.6 10*3/uL — ABNORMAL HIGH (ref 4.0–10.5)

## 2015-09-28 LAB — BASIC METABOLIC PANEL
Anion gap: 9 (ref 5–15)
BUN: 16 mg/dL (ref 6–20)
CO2: 26 mmol/L (ref 22–32)
Calcium: 7.8 mg/dL — ABNORMAL LOW (ref 8.9–10.3)
Chloride: 99 mmol/L — ABNORMAL LOW (ref 101–111)
Creatinine, Ser: 0.91 mg/dL (ref 0.61–1.24)
GFR calc Af Amer: >60 mL/min (ref >60)
GFR calc non Af Amer: >60 mL/min (ref >60)
Glucose, Bld: 123 mg/dL — ABNORMAL HIGH (ref 65–99)
Potassium: 3.6 mmol/L (ref 3.5–5.1)
Sodium: 134 mmol/L — ABNORMAL LOW (ref 135–145)

## 2015-09-28 LAB — GLUCOSE, CAPILLARY
Glucose-Capillary: 125 mg/dL — ABNORMAL HIGH (ref 65–99)
Glucose-Capillary: 112 mg/dL — ABNORMAL HIGH (ref 65–99)
Glucose-Capillary: 110 mg/dL — ABNORMAL HIGH (ref 65–99)
Glucose-Capillary: 108 mg/dL — ABNORMAL HIGH (ref 65–99)

## 2015-09-28 MED ORDER — DIMENHYDRINATE 50 MG PO TABS
25.0000 mg | ORAL_TABLET | Freq: Four times a day (QID) | ORAL | Status: DC | PRN
  Filled 2015-09-28: qty 1

## 2015-09-28 MED ORDER — FUROSEMIDE 40 MG PO TABS
40.0000 mg | ORAL_TABLET | Freq: Every day | ORAL | Status: DC
  Administered 2015-09-28 – 2015-09-30 (×3): 40 mg via ORAL
  Filled 2015-09-28 (×4): qty 1

## 2015-09-28 MED ORDER — CLOPIDOGREL BISULFATE 75 MG PO TABS
75.0000 mg | ORAL_TABLET | Freq: Every day | ORAL | Status: DC
  Administered 2015-09-28 – 2015-09-30 (×3): 75 mg via ORAL
  Filled 2015-09-28 (×4): qty 1

## 2015-09-28 MED ORDER — ASPIRIN EC 81 MG PO TBEC
81.0000 mg | DELAYED_RELEASE_TABLET | Freq: Every day | ORAL | Status: DC
  Administered 2015-09-28 – 2015-09-30 (×3): 81 mg via ORAL
  Filled 2015-09-28 (×4): qty 1

## 2015-09-28 MED ORDER — AMIODARONE HCL 200 MG PO TABS
100.0000 mg | ORAL_TABLET | Freq: Every day | ORAL | Status: DC
  Filled 2015-09-28: qty 1

## 2015-09-28 NOTE — Progress Notes (Signed)
CARDIAC REHAB PHASE I   PRE:  Rate/Rhythm: 75 SR  BP:  Supine: 100/66  Sitting:   Standing:    SaO2: 99%RA  MODE:  Ambulation: 440 ft   POST:  Rate/Rhythm: 86  BP:  Supine:   Sitting: 138/70  Standing:    SaO2: 95%RA 1330-1355 Pt stated the rolling walker would be ok to walk with rather than the rollator. Walked 440 ft on RA with rolling walker and minimal asst. Gait steady and tolerated well. To sitting on side of bed after walk. Wife and friends in room. No c/o dizziness.   Graylon Good, RN BSN  09/28/2015 1:50 PM

## 2015-09-28 NOTE — Progress Notes (Signed)
Physical Therapy Treatment Patient Details Name: [name] [name] MRN: [id number] DOB: 13-Apr-1928 Today's Date: 09/28/2015    History of Present Illness Pt admit with NSTEMI.  CABGx3.  PMH - throat CA, prostate CA, HTN    PT Comments    Per cardiac rehab note, pt began vomiting after ambulation yesterday afternoon. Pt reports the vomiting was brought on by vertigo.  Follow Up Recommendations  Home health PT;Supervision/Assistance - 24 hour     Equipment Recommendations  Other (comment) (possibly rollator)    Recommendations for Other Services       Precautions / Restrictions Precautions Precautions: Sternal;Fall    Mobility  Bed Mobility               General bed mobility comments: Pt up in [place] chair.  Transfers   Equipment used: Ambulation equipment used   Sit to Stand: Mod assist         General transfer comment: Assist to power up with pt's hands on his knees.  Ambulation/Gait Ambulation/Gait assistance: Supervision   Assistive device: 4-wheeled walker Gait Pattern/deviations: Step-through pattern;Decreased stride length Gait velocity: decreased   General Gait Details: Steady gait with rollator.   Stairs            Wheelchair Mobility    Modified Rankin (Stroke Patients Only)       Balance                                    Cognition Arousal/Alertness: Awake/alert Behavior During Therapy: WFL for tasks assessed/performed Overall Cognitive Status: Within Functional Limits for tasks assessed                      Exercises      General Comments        Pertinent Vitals/Pain Pain Assessment: No/denies pain    Home Living                      Prior Function            PT Goals (current goals can now be found in the care plan section) Acute Rehab PT Goals Patient Stated Goal: to go home PT Goal Formulation: With patient Time For Goal Achievement: 10/09/15 Potential to Achieve  Goals: Good Progress towards PT goals: Progressing toward goals    Frequency  Min 3X/week    PT Plan Current plan remains appropriate    Co-evaluation             End of Session Equipment Utilized During Treatment: Gait belt Activity Tolerance: Patient tolerated treatment well Patient left: in chair;with call bell/phone within reach     Time: 0806-0822 PT Time Calculation (min) (ACUTE ONLY): 16 min  Charges:  $Gait Training: 8-22 mins                    G Codes:      Lorriane Shire 09/28/2015, 8:26 AM

## 2015-09-28 NOTE — Progress Notes (Addendum)
      [address]       [place] [address]             [phone]      6 Days Post-Op Procedure(s) (LRB): CORONARY ARTERY BYPASS GRAFTING (CABG) TIMES  THREE  USING LEFT INTERNAL MAMMARY ARTERY AND RIGHT SAPHENOUS VEIN HARVESTED ENDOSCOPICALLY, CORONARY ENDARTERECTOMY (N/A) TRANSESOPHAGEAL ECHOCARDIOGRAM (TEE) (N/A)   Subjective:  [name] [name] complains of vertigo.  He states has uses Dramamine at home which helps.  + ambulation  + BM  Objective: Vital signs in last 24 hours: Temp:  [98 F (36.7 C)-98.2 F (36.8 C)] 98.2 F (36.8 C) (07/05 2004) Pulse Rate:  [72-77] 73 (07/05 2004) Cardiac Rhythm:  [-] Normal sinus rhythm;Atrial fibrillation;Atrial flutter (07/06 0018) Resp:  [20-23] 20 (07/05 2004) BP: (121-155)/(60-75) 121/60 mmHg (07/05 2004) SpO2:  [94 %-98 %] 98 % (07/05 2004) Weight:  [186 lb 15.2 oz (84.8 kg)] 186 lb 15.2 oz (84.8 kg) (07/06 0500)  Intake/Output from previous day: 07/05 0701 - 07/06 0700 In: 210 [P.O.:120; I.V.:40; IV Piggyback:50] Out: 1160 [Urine:1160]  General appearance: alert, cooperative and no distress Heart: irregularly irregular rhythm Lungs: diminished breath sounds bibasilar Abdomen: soft, non-tender; bowel sounds normal; no masses,  no organomegaly Extremities: edema trace Wound: clean and dry  Lab Results:  Recent Labs  09/27/15 0420 09/28/15 0445  WBC 8.7 11.6*  HGB 8.2* 8.3*  HCT 25.5* 26.1*  PLT 305 374   BMET:  Recent Labs  09/27/15 0420 09/28/15 0445  NA 135 134*  K 3.7 3.6  CL 100* 99*  CO2 26 26  GLUCOSE 111* 123*  BUN 15 16  CREATININE 0.89 0.91  CALCIUM 7.8* 7.8*    PT/INR: No results for input(s): LABPROT, INR in the last 72 hours. ABG    Component Value Date/Time   PHART 7.367 09/22/2015 2119   HCO3 23.1 09/22/2015 2119   TCO2 27 09/25/2015 1658   ACIDBASEDEF 2.0 09/22/2015 2119   O2SAT 54.0 09/27/2015 0428   CBG (last 3)   Recent Labs  09/27/15 1640 09/27/15 2145  09/28/15 0613  GLUCAP 158* 125* 125*    Assessment/Plan: S/P Procedure(s) (LRB): CORONARY ARTERY BYPASS GRAFTING (CABG) TIMES  THREE  USING LEFT INTERNAL MAMMARY ARTERY AND RIGHT SAPHENOUS VEIN HARVESTED ENDOSCOPICALLY, CORONARY ENDARTERECTOMY (N/A) TRANSESOPHAGEAL ECHOCARDIOGRAM (TEE) (N/A)  1. CV- A.Fib, chronic, rate controlled- continue Coreg, Lisinopril for BP... Will d/c Amiodarone with dizziness, N/V... Continue Plavix for Endarterectomy.... No NOAC at discharge 2. Pulm- no acute issues, CXR with improvement of bilateral pleural effusions, continue IS 3. Renal- creatinine stable, remains hypervolemic, continue lasix 4. Expected blood loss anemia- Hgb improving up to 8.3, continue iron 5. Deconditioning- H/H arrangements placed, walker for discharge 6. Dispo- patient stable, chronic A. Fib, Plavix for Endarterectomy, D/C EPW.Marland KitchenMarland Kitchenpossibly ready for d/c this weekend   LOS: 10 days    [name] [name], [name] [name] 09/28/2015  Patient with controlled atrial fibrillation, nauseated and vomited with amiodarone -stopped Patient had extended LAD endarterectomy and needs Plavix for 30 days Will not start warfarin or direct thrombin inhibitor in addition to Plavix because of patient's advanced age and risk of bleeding would exceed risk of stroke Patient will need home health nurse and home PT at discharge, probably in 2 days will be ready Tharon Aquas trigt M.D.

## 2015-09-28 NOTE — Progress Notes (Signed)
Pacing wires removed per order. Will obtain q15 VS x 1hr. Patient instructed on bedrest x1hr.

## 2015-09-28 NOTE — Care Management Important Message (Signed)
Important Message  Patient Details  Name: [name] [name] MRN: [id number] Date of Birth: 1928-06-02   Medicare Important Message Given:  Yes    Ruchi Stoney, Leroy Sea 09/28/2015, 10:27 AM

## 2015-09-28 NOTE — Progress Notes (Signed)
Subjective:  No CP/SOB. POD  # 5 CABG X 3 Objective:  Temp:  [98 F (36.7 C)-98.2 F (36.8 C)] 98.2 F (36.8 C) (07/05 2004) Pulse Rate:  [72-77] 73 (07/05 2004) Resp:  [20-23] 20 (07/05 2004) BP: (121-155)/(60-92) 121/60 mmHg (07/05 2004) SpO2:  [94 %-98 %] 98 % (07/05 2004) Weight:  [186 lb 15.2 oz (84.8 kg)] 186 lb 15.2 oz (84.8 kg) (07/06 0500) Weight change: 3.5 oz (0.1 kg)  Intake/Output from previous day: 07/05 0701 - 07/06 0700 In: 210 [P.O.:120; I.V.:40; IV Piggyback:50] Out: 1160 [Urine:1160]  Intake/Output from this shift:    Physical Exam: General appearance: alert and no distress Neck: no adenopathy, no carotid bruit, no JVD, supple, symmetrical, trachea midline and thyroid not enlarged, symmetric, no tenderness/mass/nodules Lungs: clear to auscultation bilaterally Heart: regular rate and rhythm, S1, S2 normal, no murmur, click, rub or gallop Extremities: extremities normal, atraumatic, no cyanosis or edema  Lab Results: Results for orders placed or performed during the hospital encounter of 09/17/15 (from the past 48 hour(s))  Glucose, capillary     Status: Abnormal   Collection Time: 09/26/15 11:50 AM  Result Value Ref Range   Glucose-Capillary 123 (H) 65 - 99 mg/dL   Comment 1 Capillary Specimen    Comment 2 Notify RN   Glucose, capillary     Status: None   Collection Time: 09/26/15  5:06 PM  Result Value Ref Range   Glucose-Capillary 94 65 - 99 mg/dL   Comment 1 Capillary Specimen    Comment 2 Notify RN   Glucose, capillary     Status: Abnormal   Collection Time: 09/26/15  9:44 PM  Result Value Ref Range   Glucose-Capillary 110 (H) 65 - 99 mg/dL   Comment 1 Capillary Specimen    Comment 2 Notify RN    Comment 3 Document in Chart   Basic metabolic panel     Status: Abnormal   Collection Time: 09/27/15  4:20 AM  Result Value Ref Range   Sodium 135 135 - 145 mmol/L   Potassium 3.7 3.5 - 5.1 mmol/L   Chloride 100 (L) 101 - 111 mmol/L   CO2  26 22 - 32 mmol/L   Glucose, Bld 111 (H) 65 - 99 mg/dL   BUN 15 6 - 20 mg/dL   Creatinine, Ser 0.89 0.61 - 1.24 mg/dL   Calcium 7.8 (L) 8.9 - 10.3 mg/dL   GFR calc non Af Amer >60 >60 mL/min   GFR calc Af Amer >60 >60 mL/min    Comment: (NOTE) The eGFR has been calculated using the CKD EPI equation. This calculation has not been validated in all clinical situations. eGFR's persistently <60 mL/min signify possible Chronic Kidney Disease.    Anion gap 9 5 - 15  CBC     Status: Abnormal   Collection Time: 09/27/15  4:20 AM  Result Value Ref Range   WBC 8.7 4.0 - 10.5 K/uL   RBC 2.82 (L) 4.22 - 5.81 MIL/uL   Hemoglobin 8.2 (L) 13.0 - 17.0 g/dL   HCT 25.5 (L) 39.0 - 52.0 %   MCV 90.4 78.0 - 100.0 fL   MCH 29.1 26.0 - 34.0 pg   MCHC 32.2 30.0 - 36.0 g/dL   RDW 14.0 11.5 - 15.5 %   Platelets 305 150 - 400 K/uL  Carboxyhemoglobin     Status: Abnormal   Collection Time: 09/27/15  4:28 AM  Result Value Ref Range   Total hemoglobin 7.7 (  L) 13.5 - 18.0 g/dL   O2 Saturation 54.0 %   Carboxyhemoglobin 1.6 (H) 0.5 - 1.5 %   Methemoglobin 0.8 0.0 - 1.5 %  Glucose, capillary     Status: Abnormal   Collection Time: 09/27/15  7:55 AM  Result Value Ref Range   Glucose-Capillary 109 (H) 65 - 99 mg/dL   Comment 1 Capillary Specimen    Comment 2 Notify RN   Glucose, capillary     Status: Abnormal   Collection Time: 09/27/15 11:30 AM  Result Value Ref Range   Glucose-Capillary 101 (H) 65 - 99 mg/dL   Comment 1 Notify RN    Comment 2 Document in Chart   Glucose, capillary     Status: Abnormal   Collection Time: 09/27/15  4:40 PM  Result Value Ref Range   Glucose-Capillary 158 (H) 65 - 99 mg/dL  Glucose, capillary     Status: Abnormal   Collection Time: 09/27/15  9:45 PM  Result Value Ref Range   Glucose-Capillary 125 (H) 65 - 99 mg/dL  CBC     Status: Abnormal   Collection Time: 09/28/15  4:45 AM  Result Value Ref Range   WBC 11.6 (H) 4.0 - 10.5 K/uL   RBC 2.88 (L) 4.22 - 5.81 MIL/uL     Hemoglobin 8.3 (L) 13.0 - 17.0 g/dL   HCT 26.1 (L) 39.0 - 52.0 %   MCV 90.6 78.0 - 100.0 fL   MCH 28.8 26.0 - 34.0 pg   MCHC 31.8 30.0 - 36.0 g/dL   RDW 14.0 11.5 - 15.5 %   Platelets 374 150 - 400 K/uL  Basic metabolic panel     Status: Abnormal   Collection Time: 09/28/15  4:45 AM  Result Value Ref Range   Sodium 134 (L) 135 - 145 mmol/L   Potassium 3.6 3.5 - 5.1 mmol/L   Chloride 99 (L) 101 - 111 mmol/L   CO2 26 22 - 32 mmol/L   Glucose, Bld 123 (H) 65 - 99 mg/dL   BUN 16 6 - 20 mg/dL   Creatinine, Ser 0.91 0.61 - 1.24 mg/dL   Calcium 7.8 (L) 8.9 - 10.3 mg/dL   GFR calc non Af Amer >60 >60 mL/min   GFR calc Af Amer >60 >60 mL/min    Comment: (NOTE) The eGFR has been calculated using the CKD EPI equation. This calculation has not been validated in all clinical situations. eGFR's persistently <60 mL/min signify possible Chronic Kidney Disease.    Anion gap 9 5 - 15  Glucose, capillary     Status: Abnormal   Collection Time: 09/28/15  6:13 AM  Result Value Ref Range   Glucose-Capillary 125 (H) 65 - 99 mg/dL    Imaging: Imaging results have been reviewed  Tele- Afib with CVR  Assessment/Plan:   1. Principal Problem: 2.   Non-STEMI (non-ST elevated myocardial infarction) ([place]) 3. Active Problems: 4.   Hypertension 5.   GERD (gastroesophageal reflux disease) 6.   NSTEMI (non-ST elevated myocardial infarction) ([place]) 7.   ST elevation myocardial infarction (STEMI) ([place]) 8.   ST elevation (STEMI) myocardial infarction of unspecified site ([place]) 9.   Coronary artery disease involving native coronary artery of native heart without angina pectoris 10.   Postinfarction angina ([place]) 11.   CAD (coronary artery disease) 12.   S/P CABG (coronary artery bypass graft) 13.   Time Spent Directly with Patient:  20 minutes  Length of Stay:  LOS: 10 days   POD #  5 CABG X3 NSTEMI. Looks great. Ambulating  with [facility]. Good LV Fxn by 2D pre op. In afib with CVR on PO amio. Exam  benign. LAbs OK. Home per [facility] on DOAC. Can prob F/U with Dr Harl Bowie in [facility] and more convenient for him) as OP.   Quay Burow 09/28/2015, 8:06 AM

## 2015-09-29 LAB — BASIC METABOLIC PANEL
Anion gap: 6 (ref 5–15)
BUN: 15 mg/dL (ref 6–20)
CO2: 27 mmol/L (ref 22–32)
Calcium: 7.7 mg/dL — ABNORMAL LOW (ref 8.9–10.3)
Chloride: 100 mmol/L — ABNORMAL LOW (ref 101–111)
Creatinine, Ser: 1 mg/dL (ref 0.61–1.24)
GFR calc Af Amer: >60 mL/min (ref >60)
GFR calc non Af Amer: >60 mL/min (ref >60)
Glucose, Bld: 105 mg/dL — ABNORMAL HIGH (ref 65–99)
Potassium: 3.6 mmol/L (ref 3.5–5.1)
Sodium: 133 mmol/L — ABNORMAL LOW (ref 135–145)

## 2015-09-29 LAB — CBC
HCT: 25.2 % — ABNORMAL LOW (ref 39.0–52.0)
Hemoglobin: 8.1 g/dL — ABNORMAL LOW (ref 13.0–17.0)
MCH: 29.6 pg (ref 26.0–34.0)
MCHC: 32.1 g/dL (ref 30.0–36.0)
MCV: 92 fL (ref 78.0–100.0)
Platelets: 388 10*3/uL (ref 150–400)
RBC: 2.74 MIL/uL — ABNORMAL LOW (ref 4.22–5.81)
RDW: 14.7 % (ref 11.5–15.5)
WBC: 10.1 10*3/uL (ref 4.0–10.5)

## 2015-09-29 MED ORDER — SODIUM CHLORIDE 0.9 % IV SOLN: INTRAVENOUS | Status: DC

## 2015-09-29 NOTE — Progress Notes (Signed)
CARDIAC REHAB PHASE I   PRE:  Rate/Rhythm: 76 afib  BP:  Supine: 110/70  Sitting:   Standing:    SaO2: 97%RA  MODE:  Ambulation: 450 ft   POST:  Rate/Rhythm: 87 afib  BP:  Supine:   Sitting: 120/70  Standing:    SaO2: 99%RA 1017-1112 Pt walked 450 ft on RA with rolling walker and minimal asst. C/o slight dizziness during walk but wanted to continue. To recliner after walk. Education completed with pt who voiced understanding. Encouraged IS. Discussed CRP 2 and will refer to [place]. Has seen discharge video. Stated he has walker at home. Ventilating about recent death of son. Listened and emotional support given.   Graylon Good, RN BSN  09/29/2015 11:07 AM

## 2015-09-29 NOTE — Progress Notes (Signed)
Physician Discharge Summary  Patient ID: ZYHEIR EASTER MRN: [id number] DOB/AGE: 1929/02/28 82 y.o.  Admit date: 09/17/2015 Discharge date: 09/30/2015  Admission Diagnoses:  Patient Active Problem List   Diagnosis Date Noted  . CAD (coronary artery disease) 09/22/2015  . Coronary artery disease involving native coronary artery of native heart without angina pectoris   . Postinfarction angina ([place])   . Non-STEMI (non-ST elevated myocardial infarction) ([place]) 09/18/2015  . Hypertension 09/18/2015  . GERD (gastroesophageal reflux disease) 09/18/2015  . NSTEMI (non-ST elevated myocardial infarction) ([place]) 09/18/2015  . ST elevation (STEMI) myocardial infarction of unspecified site ([place]) 09/18/2015  . ST elevation myocardial infarction (STEMI) ([place])   . Malignant neoplasm of prostate ([place]) 08/22/2011   Discharge Diagnoses:   Patient Active Problem List   Diagnosis Date Noted  . S/P CABG (coronary artery bypass graft)   . CAD (coronary artery disease) 09/22/2015  . Coronary artery disease involving native coronary artery of native heart without angina pectoris   . Postinfarction angina ([place])   . Non-STEMI (non-ST elevated myocardial infarction) ([place]) 09/18/2015  . Hypertension 09/18/2015  . GERD (gastroesophageal reflux disease) 09/18/2015  . NSTEMI (non-ST elevated myocardial infarction) ([place]) 09/18/2015  . ST elevation (STEMI) myocardial infarction of unspecified site ([place]) 09/18/2015  . ST elevation myocardial infarction (STEMI) ([place])   . Malignant neoplasm of prostate ([place]) 08/22/2011   Discharged Condition: good  History of Present Illness:  Mr. Repetti is an 82 yo white male with known history of GERD, HTN, and vocal cord cancer S/P radiation who presented to OSH with complaints of substernal left sided chest pain with radiation to both arm.  This had been occuring for the past 2 hours and the patient described it as severe.  Workup showed positive cardiac enzymes and EKG  changes consistent with NSTEMI/STEMI equivalent.  [facility].  He underwent urgent cardiac catheterization which showed severe multivessel CAD with LM involvement and poor targets for grafting.  His EF was mildly reduced.  He was placed on IV Heparin and NTG.  It was felt coronary bypass grafting would be indicated.  He was evaluated by Dr. Prescott Gum who did not feel he was a candidate for emergency surgery.  He did however recommend placement of an IABP should the patient continue to have chest pain despite IV medications.  He was agreeable to treat the patient with bypass surgery once he became more stable.    Hospital Course:   During hospitalization the patient continued to have chest pain.  He did require placement of IABP with relief of his symptoms.  Dr. Prescott Gum spoke with the patient and his family about risks and benefits of coronary bypass grafting and he was agreeable to proceed.  He was taken to the operating room on 09/22/2015.  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  He tolerated the procedure without difficulty and was taken to the SICU in stable condition.  He was extubated the evening of surgery.  During his stay in the SICU the patient was weaned off and his balloon pump was removed on POD #1.  He was weaned off Milrinone and Dopamine as tolerated.  He developed rate controlled Atrial Fibrillation and was treated with Amiodarone.  However, this is a chronic problem for him.  His chest tubes  and arterial lines were removed without difficulty.  He developed bradycardia and amiodarone dosage was reduced.  He was felt medically stable for transfer to the step down unit on POD #5.  The patient continues to make progress.  He remains in rate controlled Atrial Fibrillation, but is not felt to be a  candidate for a NOAC due to his advanced age.  He was started on Plavix for Endarterectomy to the LAD and will require 30 days of treatment.  His pacing wires have been removed without difficulty.  He developed some nausea and vomiting and his Amiodarone was stopped.  He has a long standing history of Vertigo which he treats with Dramamine which provided relief for the patient during his stay.  He continues to do well.  He is ambulating with assistance of a walker.  He is tolerating a diet.  He is felt medically stable for discharge home today.       Significant Diagnostic Studies: angiography:    Ost LAD lesion, 90% stenosed.  Mid LAD lesion, 90% stenosed.  2nd Diag lesion, 99% stenosed.  Dist LAD-1 lesion, 80% stenosed.  Dist LAD-2 lesion, 90% stenosed.  Ost Cx to Prox Cx lesion, 70% stenosed.  Mid Cx lesion, 100% stenosed.  Mid RCA lesion, 80% stenosed.  2nd RPLB lesion, 99% stenosed.  Dist RCA lesion, 80% stenosed.  There is severe left ventricular systolic dysfunction.  Prox RCA lesion, 40% stenosed.   Severe diffuse multivessel CAD with ostial 90% LAD stenosis, diffuse 90% proximal to mid and 39 and 80% LAD stenoses with subtotal 99% stenosis in the second diagonal vessel; total occlusion of the proximal circumflex with collateral supplying the distal circumflex; and diffusely irregular RCA with 80% stenosis in the region of the acute margin and 99% PLA stenosis with left-to-right collaterals.  Severe global LV dysfunction compatible with an ischemic cardiomyopathy with diffuse hypokinesis and an ejection fraction of 25-30%. LVEDP 12 mm Hg  Dilated aortic root and ascending aorta.  RECOMMENDATION: Surgical consultation will be obtained for consideration of CABG revascularization surgery.  Treatments: surgery:   1. Coronary artery bypass grafting x3 (left internal mammary artery to  left anterior descending artery, saphenous vein graft to distal  right coronary  artery, saphenous vein graft to circumflex  marginal). 2. Coronary endarterectomy of the left anterior descending. 3. 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 4. Right lower extremity endoscopic vein harvest.  Disposition: 01-Home or Self Care, Home health  Discharge Medications:  The patient has been discharged on:   1.Beta Blocker:  Yes [ x  ]                              No   [   ]                              If No, reason:  2.Ace Inhibitor/ARB: Yes [ x  ]                                     No  [    ]  If No, reason:  3.Statin:   Yes [ x  ]                  No  [   ]                  If No, reason:  4.Ecasa:  Yes  [ x  ]                  No   [   ]                  If No, reason:         Discharge Instructions    Amb Referral to Cardiac Rehabilitation    Complete by:  As directed   Diagnosis:  CABG  CABG X ___:  3            Medication List    STOP taking these medications        amLODipine-benazepril 10-20 MG capsule  Commonly known as:  LOTREL     cloNIDine 0.1 MG tablet  Commonly known as:  CATAPRES     valsartan-hydrochlorothiazide 320-12.5 MG tablet  Commonly known as:  DIOVAN-HCT      TAKE these medications        alum & mag hydroxide-simeth 200-200-20 MG/5ML suspension  Commonly known as:  MAALOX/MYLANTA  Take 30 mLs by mouth every 6 (six) hours as needed for indigestion or heartburn.     aspirin 81 MG EC tablet  Take 1 tablet (81 mg total) by mouth daily.     atorvastatin 80 MG tablet  Commonly known as:  LIPITOR  Take 1 tablet (80 mg total) by mouth daily at 6 PM.     BILBERRY PO  Take 2 tablets by mouth daily.     carvedilol 6.25 MG tablet  Commonly known as:  COREG  Take 1 tablet (6.25 mg total) by mouth 2 (two) times daily with a meal.     clopidogrel 75 MG tablet  Commonly known as:  PLAVIX  Take 1 tablet (75 mg total) by mouth daily.      furosemide 40 MG tablet  Commonly known as:  LASIX  Take 1 tablet (40 mg total) by mouth daily. For 7 Days     GARLIC PO  Take 1 tablet by mouth daily.     levothyroxine 50 MCG tablet  Commonly known as:  SYNTHROID, LEVOTHROID  Take 50 mcg by mouth every morning.     lisinopril 5 MG tablet  Commonly known as:  PRINIVIL,ZESTRIL  Take 1 tablet (5 mg total) by mouth daily.     omeprazole 20 MG capsule  Commonly known as:  PRILOSEC  Take 20 mg by mouth every morning.     potassium chloride 10 MEQ tablet  Commonly known as:  K-DUR  Take 1 tablet (10 mEq total) by mouth daily. For 7 Days     PRESERVISION AREDS 2 Caps  Take 2 capsules by mouth daily.     traMADol 50 MG tablet  Commonly known as:  ULTRAM  Take 1-2 tablets (50-100 mg total) by mouth every 4 (four) hours as needed for moderate pain.       Follow-up Information    Follow up with Ivin Poot III, MD On 10/23/2015.   Specialty:  Cardiothoracic Surgery   Why:  Appointment is at 1:30, please get CXR at 1:00 at [facility] located on first floor of  our office building   Contact information:   [place] [address] [phone]       Follow up with Carlyle Dolly, MD On 10/16/2015.   Specialty:  Cardiology   Why:  Appointment is at 11:40   Contact information:   [address] [place] [address] [phone]       Follow up with [facility].   Why:  Registered Nurse   Contact information:   [address] [place] [address] [phone]       Follow up with [facility] On 10/06/2015.   Specialty:  Cardiothoracic Surgery   Why:  Appointment is at 10:00 with nurse for staple removal   Contact information:   [address] [place], [place] [phone]      Signed: Ellwood Handler 09/30/2015, 8:09 AM

## 2015-09-29 NOTE — Progress Notes (Signed)
Physical Therapy Treatment Patient Details Name: [name] [name] MRN: [id number] DOB: [birth year] Today's Date: 09/29/2015    History of Present Illness Pt admit with NSTEMI.  CABGx3.  PMH - throat CA, prostate CA, HTN    PT Comments    Has progressed well, follows sternal precautions most of the time without cuing.  Steady with RW and without AD for short distances.  Follow Up Recommendations  Home health PT;Supervision/Assistance - 24 hour     Equipment Recommendations  Other (comment)    Recommendations for Other Services       Precautions / Restrictions Precautions Precautions: Sternal;Fall    Mobility  Bed Mobility               General bed mobility comments: on EOB when arrived, but discussed getting OOB with sternal precautions  Transfers Overall transfer level: Needs assistance Equipment used: None Transfers: Sit to/from Stand Sit to Stand: Min assist         General transfer comment: assist to come forward.  reinforced sternal precautions.  Ambulation/Gait Ambulation/Gait assistance: Supervision Ambulation Distance (Feet): 450 Feet Assistive device: Rolling walker (2 wheeled) Gait Pattern/deviations: Step-through pattern Gait velocity: decreased Gait velocity interpretation: Below normal speed for age/gender General Gait Details: generally steady, still using RW, but in room was steady without RW   Stairs Stairs: Yes Stairs assistance: Supervision Stair Management: One rail Right;Alternating pattern;Step to pattern;Forwards Number of Stairs: 4 General stair comments: steady with use of rail  Wheelchair Mobility    Modified Rankin (Stroke Patients Only)       Balance     Sitting balance-Leahy Scale: Good       Standing balance-Leahy Scale: Fair                      Cognition Arousal/Alertness: Awake/alert Behavior During Therapy: WFL for tasks assessed/performed Overall Cognitive Status: Within Functional Limits for  tasks assessed                      Exercises      General Comments        Pertinent Vitals/Pain Pain Assessment: No/denies pain    Home Living                      Prior Function            PT Goals (current goals can now be found in the care plan section) Acute Rehab PT Goals Patient Stated Goal: to go home PT Goal Formulation: With patient Time For Goal Achievement: 10/09/15 Potential to Achieve Goals: Good Progress towards PT goals: Progressing toward goals    Frequency  Min 3X/week    PT Plan Current plan remains appropriate    Co-evaluation             End of Session   Activity Tolerance: Patient tolerated treatment well Patient left: in chair;with call bell/phone within reach;with family/visitor present     Time: [id number] PT Time Calculation (min) (ACUTE ONLY): 24 min  Charges:  $Gait Training: 8-22 mins $Therapeutic Activity: 8-22 mins                    G Codes:      Stevie Charter, Tessie Fass 09/29/2015, 5:48 PM  09/29/2015  Donnella Sham, [place] [phone]  (pager)

## 2015-09-29 NOTE — Discharge Instructions (Signed)
Coronary Artery Bypass Grafting, Care After °Refer to this sheet in the next few weeks. These instructions provide you with information on caring for yourself after your procedure. Your health care provider may also give you more specific instructions. Your treatment has been planned according to current medical practices, but problems sometimes occur. Call your health care provider if you have any problems or questions after your procedure. °WHAT TO EXPECT AFTER THE PROCEDURE °Recovery from surgery will be different for everyone. Some people feel well after 3 or 4 weeks, while for others it takes longer. After your procedure, it is typical to have the following: °· Nausea and a lack of appetite.   °· Constipation. °· Weakness and fatigue.   °· Depression or irritability.   °· Pain or discomfort at your incision site. °HOME CARE INSTRUCTIONS °· Take medicines only as directed by your health care provider. Do not stop taking medicines or start any new medicines without first checking with your health care provider. °· Take your pulse as directed by your health care provider. °· Perform deep breathing as directed by your health care provider. If you were given a device called an incentive spirometer, use it to practice deep breathing several times a day. Support your chest with a pillow or your arms when you take deep breaths or cough. °· Keep incision areas clean, dry, and protected. Remove or change any bandages (dressings) only as directed by your health care provider. You may have skin adhesive strips over the incision areas. Do not take the strips off. They will fall off on their own. °· Check incision areas daily for any swelling, redness, or drainage. °· If incisions were made in your legs, do the following: °¨ Avoid crossing your legs.   °¨ Avoid sitting for long periods of time. Change positions every 30 minutes.   °¨ Elevate your legs when you are sitting. °· Wear compression stockings as directed by your  health care provider. These stockings help keep blood clots from forming in your legs. °· Take showers once your health care provider approves. Until then, only take sponge baths. Pat incisions dry. Do not rub incisions with a washcloth or towel. Do not take baths, swim, or use a hot tub until your health care provider approves. °· Eat foods that are high in fiber, such as raw fruits and vegetables, whole grains, beans, and nuts. Meats should be lean cut. Avoid canned, processed, and fried foods. °· Drink enough fluid to keep your urine clear or pale yellow. °· Weigh yourself every day. This helps identify if you are retaining fluid that may make your heart and lungs work harder. °· Rest and limit activity as directed by your health care provider. You may be instructed to: °¨ Stop any activity at once if you have chest pain, shortness of breath, irregular heartbeats, or dizziness. Get help right away if you have any of these symptoms. °¨ Move around frequently for short periods or take short walks as directed by your health care provider. Increase your activities gradually. You may need physical therapy or cardiac rehabilitation to help strengthen your muscles and build your endurance. °¨ Avoid lifting, pushing, or pulling anything heavier than 10 lb (4.5 kg) for at least 6 weeks after surgery. °· Do not drive until your health care provider approves.  °· Ask your health care provider when you may return to work. °· Ask your health care provider when you may resume sexual activity. °· Keep all follow-up visits as directed by your health care   provider. This is important. °SEEK MEDICAL CARE IF: °· You have swelling, redness, increasing pain, or drainage at the site of an incision. °· You have a fever. °· You have swelling in your ankles or legs. °· You have pain in your legs.   °· You gain 2 or more pounds (0.9 kg) a day. °· You are nauseous or vomit. °· You have diarrhea.  °SEEK IMMEDIATE MEDICAL CARE IF: °· You have  chest pain that goes to your jaw or arms. °· You have shortness of breath.   °· You have a fast or irregular heartbeat.   °· You notice a "clicking" in your breastbone (sternum) when you move.   °· You have numbness or weakness in your arms or legs. °· You feel dizzy or light-headed.   °MAKE SURE YOU: °· Understand these instructions. °· Will watch your condition. °· Will get help right away if you are not doing well or get worse. °  °This information is not intended to replace advice given to you by your health care provider. Make sure you discuss any questions you have with your health care provider. °  °Document Released: 09/28/2004 Document Revised: 04/01/2014 Document Reviewed: 08/18/2012 °Elsevier Interactive Patient Education ©2016 [employer] Inc[employer] ° °Endoscopic Saphenous Vein Harvesting, Care After °Refer to this sheet in the next few weeks. These instructions provide you with information on caring for yourself after your procedure. Your health care provider may also give you more specific instructions. Your treatment has been planned according to current medical practices, but problems sometimes occur. Call your health care provider if you have any problems or questions after your procedure. °HOME CARE INSTRUCTIONS °Medicine °· Take whatever pain medicine your surgeon prescribes. Follow the directions carefully. Do not take over-the-counter pain medicine unless your surgeon says it is okay. Some pain medicine can cause bleeding problems for several weeks after surgery. °· Follow your surgeon's instructions about driving. You will probably not be permitted to drive after heart surgery. °· Take any medicines your surgeon prescribes. Any medicines you took before your heart surgery should be checked with your health care provider before you start taking them again. °Wound care °· If your surgeon has prescribed an elastic bandage or stocking, ask how long you should wear it. °· Check the area around your surgical  cuts (incisions) whenever your bandages (dressings) are changed. Look for any redness or swelling. °· You will need to return to have the stitches (sutures) or staples taken out. Ask your surgeon when to do that. °· Ask your surgeon when you can shower or bathe. °Activity °· Try to keep your legs raised when you are sitting. °· Do any exercises your health care providers have given you. These may include deep breathing exercises, coughing, walking, or other exercises. °SEEK MEDICAL CARE IF: °· You have any questions about your medicines. °· You have more leg pain, especially if your pain medicine stops working. °· New or growing bruises develop on your leg. °· Your leg swells, feels tight, or becomes red. °· You have numbness in your leg. °SEEK IMMEDIATE MEDICAL CARE IF: °· Your pain gets much worse. °· Blood or fluid leaks from any of the incisions. °· Your incisions become warm, swollen, or red. °· You have chest pain. °· You have trouble breathing. °· You have a fever. °· You have more pain near your leg incision. °MAKE SURE YOU: °· Understand these instructions. °· Will watch your condition. °· Will get help right away if you are not doing well or   get worse. °  °This information is not intended to replace advice given to you by your health care provider. Make sure you discuss any questions you have with your health care provider. °  °Document Released: 11/21/2010 Document Revised: 04/01/2014 Document Reviewed: 11/21/2010 °Elsevier Interactive Patient Education ©2016 Elsevier Inc. ° ° °

## 2015-09-29 NOTE — Progress Notes (Addendum)
      [address]       [place] [address]             [phone]      7 Days Post-Op Procedure(s) (LRB): CORONARY ARTERY BYPASS GRAFTING (CABG) TIMES  THREE  USING LEFT INTERNAL MAMMARY ARTERY AND RIGHT SAPHENOUS VEIN HARVESTED ENDOSCOPICALLY, CORONARY ENDARTERECTOMY (N/A) TRANSESOPHAGEAL ECHOCARDIOGRAM (TEE) (N/A)   Subjective:  [name] has no new complaints.  He states his vertigo is better after dramamine.  He would like to go home today.  Objective: Vital signs in last 24 hours: Temp:  [97.7 F (36.5 C)-98.2 F (36.8 C)] 98.2 F (36.8 C) (07/07 0535) Pulse Rate:  [66-87] 76 (07/07 0535) Cardiac Rhythm:  [-] Normal sinus rhythm;Bundle branch block;Heart block (07/06 1900) Resp:  [18-20] 20 (07/07 0535) BP: (103-138)/(50-76) 121/76 mmHg (07/07 0535) SpO2:  [98 %-100 %] 98 % (07/07 0535) Weight:  [182 lb 1.6 oz (82.6 kg)] 182 lb 1.6 oz (82.6 kg) (07/07 0535)  Intake/Output from previous day: 07/06 0701 - 07/07 0700 In: 240 [P.O.:240] Out: 425 [Urine:425]  General appearance: alert, cooperative and no distress Heart: irregularly irregular rhythm Lungs: diminished breath sounds bibasilar Abdomen: soft, non-tender; bowel sounds normal; no masses,  no organomegaly Extremities: edema trace Wound: clean and dry  Lab Results:  Recent Labs  09/28/15 0445 09/29/15 0423  WBC 11.6* 10.1  HGB 8.3* 8.1*  HCT 26.1* 25.2*  PLT 374 388   BMET:  Recent Labs  09/28/15 0445 09/29/15 0423  NA 134* 133*  K 3.6 3.6  CL 99* 100*  CO2 26 27  GLUCOSE 123* 105*  BUN 16 15  CREATININE 0.91 1.00  CALCIUM 7.8* 7.7*    PT/INR: No results for input(s): LABPROT, INR in the last 72 hours. ABG    Component Value Date/Time   PHART 7.367 09/22/2015 2119   HCO3 23.1 09/22/2015 2119   TCO2 27 09/25/2015 1658   ACIDBASEDEF 2.0 09/22/2015 2119   O2SAT 54.0 09/27/2015 0428   CBG (last 3)   Recent Labs  09/28/15 1128 09/28/15 1622 09/28/15 2045  GLUCAP  112* 110* 108*    Assessment/Plan: S/P Procedure(s) (LRB): CORONARY ARTERY BYPASS GRAFTING (CABG) TIMES  THREE  USING LEFT INTERNAL MAMMARY ARTERY AND RIGHT SAPHENOUS VEIN HARVESTED ENDOSCOPICALLY, CORONARY ENDARTERECTOMY (N/A) TRANSESOPHAGEAL ECHOCARDIOGRAM (TEE) (N/A)  1. CV- Chronic A.Fib, rate controlled- continue Coreg, Lisinopril, Plavix for endarterectomy to LAD 2. Pulm- no acute issues, continue IS 3. Renal- creatinine stable, weight is trending down, continue Lasix 4. Expected post operative blood loss anemia- stable, continue iron 5. Dispo- patient stable, H/H arrangements have been made, vertigo better with Dramamine... Patient wants to go home today... Will defer to Dr. Prescott Gum   LOS: 11 days    Ellwood Handler 09/29/2015  Patient examined. He is not ready to go home He had a large emesis episode-probably related to iron and/or potassium tablet so we'll stop both IV fluids at 40 cc an hour for 12 hours He is in sinus rhythm Home tomorrow afternoon if his nausea resolves and his oral intake is safe patient examined and medical record reviewed,agree with above note. Tharon Aquas Trigt III 09/29/2015

## 2015-09-29 NOTE — Care Management Note (Addendum)
Case Management Note  Patient Details  Name: [name] [name] MRN: [id number] Date of Birth: [birth year]  Subjective/Objective:        Admitted with CP            Action/Plan:  PTA from home with wife independent.  Already has walker, and bedside commode in home.  Pt refused HHPT but agreed with [facility].  Pt given choice- chose [facility] - agency contacted and referral accepted.  CM will continue to follow for discharge plan   Expected Discharge Date:                  Expected Discharge Plan:  [place]  In-House Referral:     Discharge planning Services  CM Consult  Post Acute Care Choice:    Choice offered to:     DME Arranged:    DME Agency:     HH Arranged:  RN [place] Agency:  [place]  Status of Service:  In process, will continue to follow  If discussed at Long Length of Stay Meetings, dates discussed:    Additional Comments:  Maryclare Labrador, RN 09/29/2015, 12:07 PM

## 2015-09-30 MED ORDER — ALUM & MAG HYDROXIDE-SIMETH 200-200-20 MG/5ML PO SUSP: 30.0000 mL | Freq: Four times a day (QID) | ORAL | Status: DC | PRN

## 2015-09-30 MED ORDER — CLOPIDOGREL BISULFATE 75 MG PO TABS: 75.0000 mg | ORAL_TABLET | Freq: Every day | ORAL | Status: DC

## 2015-09-30 MED ORDER — ASPIRIN 81 MG PO TBEC: 81.0000 mg | DELAYED_RELEASE_TABLET | Freq: Every day | ORAL | Status: AC

## 2015-09-30 MED ORDER — POTASSIUM CHLORIDE ER 10 MEQ PO TBCR: 10.0000 meq | EXTENDED_RELEASE_TABLET | Freq: Every day | ORAL | Status: DC

## 2015-09-30 MED ORDER — FUROSEMIDE 40 MG PO TABS: 40.0000 mg | ORAL_TABLET | Freq: Every day | ORAL | Status: DC

## 2015-09-30 MED ORDER — TRAMADOL HCL 50 MG PO TABS: 50.0000 mg | ORAL_TABLET | ORAL | Status: DC | PRN

## 2015-09-30 MED ORDER — LISINOPRIL 5 MG PO TABS: 5.0000 mg | ORAL_TABLET | Freq: Every day | ORAL | Status: DC

## 2015-09-30 MED ORDER — CARVEDILOL 6.25 MG PO TABS: 6.2500 mg | ORAL_TABLET | Freq: Two times a day (BID) | ORAL | Status: DC

## 2015-09-30 MED ORDER — ATORVASTATIN CALCIUM 80 MG PO TABS: 80.0000 mg | ORAL_TABLET | Freq: Every day | ORAL | Status: DC

## 2015-09-30 NOTE — Progress Notes (Signed)
      [address]       [place] [address]             [phone]      8 Days Post-Op Procedure(s) (LRB): CORONARY ARTERY BYPASS GRAFTING (CABG) TIMES  THREE  USING LEFT INTERNAL MAMMARY ARTERY AND RIGHT SAPHENOUS VEIN HARVESTED ENDOSCOPICALLY, CORONARY ENDARTERECTOMY (N/A) TRANSESOPHAGEAL ECHOCARDIOGRAM (TEE) (N/A)   Subjective:  [name] [name] has no new complaints.  He has had no further vomiting since that single episode yesterday.  He was able to eat dinner last night without difficulty.  Objective: Vital signs in last 24 hours: Temp:  [98.1 F (36.7 C)-98.3 F (36.8 C)] 98.1 F (36.7 C) (07/08 0433) Pulse Rate:  [62-73] 73 (07/08 0433) Cardiac Rhythm:  [-] Atrial fibrillation (07/07 1900) Resp:  [18] 18 (07/08 0433) BP: (111-126)/(58-63) 124/63 mmHg (07/08 0433) SpO2:  [95 %-98 %] 95 % (07/08 0433) Weight:  [182 lb 3.2 oz (82.645 kg)] 182 lb 3.2 oz (82.645 kg) (07/08 0433)  Intake/Output from previous day: 07/07 0701 - 07/08 0700 In: 10 [I.V.:10] Out: -   General appearance: alert, cooperative and no distress Heart: irregularly irregular rhythm Lungs: diminished breath sounds bibasilar Abdomen: soft, non-tender; bowel sounds normal; no masses,  no organomegaly Extremities: edema trace Wound: clean and dry  Lab Results:  Recent Labs  09/28/15 0445 09/29/15 0423  WBC 11.6* 10.1  HGB 8.3* 8.1*  HCT 26.1* 25.2*  PLT 374 388   BMET:  Recent Labs  09/28/15 0445 09/29/15 0423  NA 134* 133*  K 3.6 3.6  CL 99* 100*  CO2 26 27  GLUCOSE 123* 105*  BUN 16 15  CREATININE 0.91 1.00  CALCIUM 7.8* 7.7*    PT/INR: No results for input(s): LABPROT, INR in the last 72 hours. ABG    Component Value Date/Time   PHART 7.367 09/22/2015 2119   HCO3 23.1 09/22/2015 2119   TCO2 27 09/25/2015 1658   ACIDBASEDEF 2.0 09/22/2015 2119   O2SAT 54.0 09/27/2015 0428   CBG (last 3)   Recent Labs  09/28/15 1128 09/28/15 1622 09/28/15 2045  GLUCAP  112* 110* 108*    Assessment/Plan: S/P Procedure(s) (LRB): CORONARY ARTERY BYPASS GRAFTING (CABG) TIMES  THREE  USING LEFT INTERNAL MAMMARY ARTERY AND RIGHT SAPHENOUS VEIN HARVESTED ENDOSCOPICALLY, CORONARY ENDARTERECTOMY (N/A) TRANSESOPHAGEAL ECHOCARDIOGRAM (TEE) (N/A)  1. CV- Chronic A. Fib, continue Coreg, Lisinpril, Plavix 2. Pulm- no acute issues, continue IS 3. Renal- creatinine has been stable, hypervolemia improving, continue lasix 4. GI- N/V yesterday afternoon.... Tolerated dinner.. Will d/c IV Fluid 5. Dispo- patient stable, no more vomiting after single episode yesterday... Will monitor patient if tolerates breakfast and lunch without further N/V will d/c home this afternoon   LOS: 12 days    [name], [name] 09/30/2015

## 2015-10-01 DIAGNOSIS — Z951 Presence of aortocoronary bypass graft: Secondary | ICD-10-CM | POA: Diagnosis not present

## 2015-10-01 DIAGNOSIS — Z48812 Encounter for surgical aftercare following surgery on the circulatory system: Secondary | ICD-10-CM | POA: Diagnosis not present

## 2015-10-01 NOTE — Discharge Summary (Signed)
Freddrick March, PA-C Physician Assistant Certified Signed Cardiothoracic Surgery Progress Notes 09/29/2015 10:20 AM    Expand All Collapse All   Physician Discharge Summary  Patient ID: JEROMIAH COPELIN MRN: [id number] DOB/AGE: 04/13/1928 82 y.o.  Admit date: 09/17/2015 Discharge date: 09/30/2015  Admission Diagnoses:  Patient Active Problem List   Diagnosis Date Noted  . CAD (coronary artery disease) 09/22/2015  . Coronary artery disease involving native coronary artery of native heart without angina pectoris   . Postinfarction angina ([place])   . Non-STEMI (non-ST elevated myocardial infarction) ([place]) 09/18/2015  . Hypertension 09/18/2015  . GERD (gastroesophageal reflux disease) 09/18/2015  . NSTEMI (non-ST elevated myocardial infarction) ([place]) 09/18/2015  . ST elevation (STEMI) myocardial infarction of unspecified site ([place]) 09/18/2015  . ST elevation myocardial infarction (STEMI) ([place])   . Malignant neoplasm of prostate ([place]) 08/22/2011   Discharge Diagnoses:   Patient Active Problem List   Diagnosis Date Noted  . S/P CABG (coronary artery bypass graft)   . CAD (coronary artery disease) 09/22/2015  . Coronary artery disease involving native coronary artery of native heart without angina pectoris   . Postinfarction angina ([place])   . Non-STEMI (non-ST elevated myocardial infarction) ([place]) 09/18/2015  . Hypertension 09/18/2015  . GERD (gastroesophageal reflux disease) 09/18/2015  . NSTEMI (non-ST elevated myocardial infarction) ([place]) 09/18/2015  . ST elevation (STEMI) myocardial infarction of unspecified site ([place]) 09/18/2015  . ST elevation myocardial infarction (STEMI) ([place])   . Malignant neoplasm of prostate ([place]) 08/22/2011   Discharged Condition: good  History of Present Illness:  Mr. Lipp is an 82 yo white male with known history of GERD, HTN, and vocal cord cancer S/P radiation who presented to  OSH with complaints of substernal left sided chest pain with radiation to both arm. This had been occuring for the past 2 hours and the patient described it as severe. Workup showed positive cardiac enzymes and EKG changes consistent with NSTEMI/STEMI equivalent. [facility]. He underwent urgent cardiac catheterization which showed severe multivessel CAD with LM involvement and poor targets for grafting. His EF was mildly reduced. He was placed on IV Heparin and NTG. It was felt coronary bypass grafting would be indicated. He was evaluated by Dr. Prescott Gum who did not feel he was a candidate for emergency surgery. He did however recommend placement of an IABP should the patient continue to have chest pain despite IV medications. He was agreeable to treat the patient with bypass surgery once he became more stable.   Hospital Course:   During hospitalization the patient continued to have chest pain. He did require placement of IABP with relief of his symptoms. Dr. Prescott Gum spoke with the patient and his family about risks and benefits of coronary bypass grafting and he was agreeable to proceed. He was taken to the operating room on 09/22/2015. 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 He tolerated the procedure without difficulty and was taken to the SICU in stable condition. He was extubated the evening of surgery. During his stay in the SICU the patient was weaned off and his balloon pump was removed on POD #1. He was weaned off Milrinone and Dopamine as tolerated. He developed rate controlled Atrial Fibrillation and was treated with Amiodarone. However, this is a chronic problem for him. His  chest tubes and arterial lines were removed without difficulty. He developed bradycardia and  amiodarone dosage was reduced. He was felt medically stable for transfer to the step down unit on POD #5. The patient continues to make progress. He remains in rate controlled Atrial Fibrillation, but is not felt to be a candidate for a NOAC due to his advanced age. He was started on Plavix for Endarterectomy to the LAD and will require 30 days of treatment. His pacing wires have been removed without difficulty. He developed some nausea and vomiting and his Amiodarone was stopped. He has a long standing history of Vertigo which he treats with Dramamine which provided relief for the patient during his stay. He continues to do well. He is ambulating with assistance of a walker. He is tolerating a diet. He is felt medically stable for discharge home today.   Significant Diagnostic Studies: angiography:    Ost LAD lesion, 90% stenosed.  Mid LAD lesion, 90% stenosed.  2nd Diag lesion, 99% stenosed.  Dist LAD-1 lesion, 80% stenosed.  Dist LAD-2 lesion, 90% stenosed.  Ost Cx to Prox Cx lesion, 70% stenosed.  Mid Cx lesion, 100% stenosed.  Mid RCA lesion, 80% stenosed.  2nd RPLB lesion, 99% stenosed.  Dist RCA lesion, 80% stenosed.  There is severe left ventricular systolic dysfunction.  Prox RCA lesion, 40% stenosed.   Severe diffuse multivessel CAD with ostial 90% LAD stenosis, diffuse 90% proximal to mid and 31 and 80% LAD stenoses with subtotal 99% stenosis in the second diagonal vessel; total occlusion of the proximal circumflex with collateral supplying the distal circumflex; and diffusely irregular RCA with 80% stenosis in the region of the acute margin and 99% PLA stenosis with left-to-right collaterals.  Severe global LV dysfunction compatible with an ischemic cardiomyopathy with diffuse hypokinesis and an ejection fraction of 25-30%. LVEDP 12 mm Hg  Dilated aortic root and ascending aorta.  RECOMMENDATION: Surgical consultation will be obtained for  consideration of CABG revascularization surgery.  Treatments: surgery:   1. Coronary artery bypass grafting x3 (left internal mammary artery to  left anterior descending artery, saphenous vein graft to distal  right coronary artery, saphenous vein graft to circumflex  marginal). 2. Coronary endarterectomy of the left anterior descending. 3. 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 4. Right lower extremity endoscopic vein harvest.  Disposition: 01-Home or Self Care, Home health  Discharge Medications:  The patient has been discharged on:   1.Beta Blocker: Yes [ x ]  No [ ]   If No, reason:  2.Ace Inhibitor/ARB: Yes [ x ]  No [ ]   If No, reason:  3.Statin: Yes [ x ]  No [ ]   If No, reason:  4.Ecasa: Yes [ x ]  No [ ]   If No, reason:         Discharge Instructions    Amb Referral to Cardiac Rehabilitation  Complete by: As directed   Diagnosis: CABG  CABG X ___: 3            Medication List    STOP taking these medications       amLODipine-benazepril 10-20 MG capsule  Commonly known as: LOTREL     cloNIDine 0.1 MG tablet  Commonly known as: CATAPRES     valsartan-hydrochlorothiazide 320-12.5 MG tablet  Commonly known as: DIOVAN-HCT      TAKE these medications       alum & mag hydroxide-simeth 200-200-20 MG/5ML suspension  Commonly known as: MAALOX/MYLANTA  Take 30 mLs by mouth every 6 (six) hours as needed for indigestion or heartburn.     aspirin 81 MG EC tablet  Take 1 tablet (81 mg total) by mouth daily.     atorvastatin 80 MG tablet  Commonly known as: LIPITOR  Take 1 tablet (80 mg total) by mouth daily at 6 PM.      BILBERRY PO  Take 2 tablets by mouth daily.     carvedilol 6.25 MG tablet  Commonly known as: COREG  Take 1 tablet (6.25 mg total) by mouth 2 (two) times daily with a meal.     clopidogrel 75 MG tablet  Commonly known as: PLAVIX  Take 1 tablet (75 mg total) by mouth daily.     furosemide 40 MG tablet  Commonly known as: LASIX  Take 1 tablet (40 mg total) by mouth daily. For 7 Days     GARLIC PO  Take 1 tablet by mouth daily.     levothyroxine 50 MCG tablet  Commonly known as: SYNTHROID, LEVOTHROID  Take 50 mcg by mouth every morning.     lisinopril 5 MG tablet  Commonly known as: PRINIVIL,ZESTRIL  Take 1 tablet (5 mg total) by mouth daily.     omeprazole 20 MG capsule  Commonly known as: PRILOSEC  Take 20 mg by mouth every morning.     potassium chloride 10 MEQ tablet  Commonly known as: K-DUR  Take 1 tablet (10 mEq total) by mouth daily. For 7 Days     PRESERVISION AREDS 2 Caps  Take 2 capsules by mouth daily.     traMADol 50 MG tablet  Commonly known as: ULTRAM  Take 1-2 tablets (50-100 mg total) by mouth every 4 (four) hours as needed for moderate pain.       Follow-up Information    Follow up with Ivin Poot III, MD On 10/23/2015.   Specialty: Cardiothoracic Surgery   Why: Appointment is at 1:30, please get CXR at 1:00 at [facility] located on first floor of our office building   Contact information:   [address] [place] [address] [phone]       Follow up with Carlyle Dolly, MD On 10/16/2015.   Specialty: Cardiology   Why: Appointment is at 11:40   Contact information:   [address] [place] [address] [phone]       Follow up with [facility].   Why: Registered Nurse   Contact information:   [address] [place] [address] [phone]       Follow up with [facility] On 10/06/2015.   Specialty: Cardiothoracic Surgery   Why: Appointment is at 10:00 with nurse for staple removal   Contact information:   [address] [place], [place] [phone]      Signed: Ellwood Handler 09/30/2015, 8:09 AM

## 2015-10-06 ENCOUNTER — Encounter (INDEPENDENT_AMBULATORY_CARE_PROVIDER_SITE_OTHER): Payer: Medicare HMO | Admitting: Ophthalmology

## 2015-10-06 ENCOUNTER — Ambulatory Visit (INDEPENDENT_AMBULATORY_CARE_PROVIDER_SITE_OTHER): Payer: Self-pay | Admitting: *Deleted

## 2015-10-06 DIAGNOSIS — I25110 Atherosclerotic heart disease of native coronary artery with unstable angina pectoris: Secondary | ICD-10-CM

## 2015-10-06 DIAGNOSIS — Z951 Presence of aortocoronary bypass graft: Secondary | ICD-10-CM

## 2015-10-06 NOTE — Progress Notes (Signed)
[name] [name] returns s/p CABG X 3 on 09/22/15. He is doing very well.  His sternal incision and chest tube sites are well healed, although one chest tube site is not completely sealed. His right leg EVH incision is well healed also.  I removed the staples from the well healed previous right axillary cannulation incision.  His appetite and bowels are good.  His only concern is some right lower arm discomfort and numbness and tingling of his last two digits. His left arm hurt briefly, but that has resolved. He was reassured and an explanation was provided. He is using Tramadol when needed. He will return as scheduled with a CXR.

## 2015-10-16 ENCOUNTER — Ambulatory Visit (INDEPENDENT_AMBULATORY_CARE_PROVIDER_SITE_OTHER): Payer: Medicare HMO | Admitting: Cardiology

## 2015-10-16 ENCOUNTER — Encounter: Payer: Self-pay | Admitting: Cardiology

## 2015-10-16 VITALS — BP 148/80 | HR 73 | Ht 70.0 in | Wt 190.0 lb

## 2015-10-16 DIAGNOSIS — I4891 Unspecified atrial fibrillation: Secondary | ICD-10-CM

## 2015-10-16 DIAGNOSIS — I5033 Acute on chronic diastolic (congestive) heart failure: Secondary | ICD-10-CM

## 2015-10-16 DIAGNOSIS — I2510 Atherosclerotic heart disease of native coronary artery without angina pectoris: Secondary | ICD-10-CM | POA: Diagnosis not present

## 2015-10-16 DIAGNOSIS — Z79899 Other long term (current) drug therapy: Secondary | ICD-10-CM

## 2015-10-16 MED ORDER — FUROSEMIDE 40 MG PO TABS: 40.0000 mg | ORAL_TABLET | Freq: Every day | ORAL | 3 refills | Status: DC

## 2015-10-16 MED ORDER — POTASSIUM CHLORIDE ER 10 MEQ PO TBCR: 10.0000 meq | EXTENDED_RELEASE_TABLET | Freq: Every day | ORAL | 3 refills | Status: DC

## 2015-10-16 NOTE — Patient Instructions (Signed)
Medication Instructions:  START LASIX 40 MG DAILY  START POTASSIUM 10 MEQ DAILY   Labwork: Your physician recommends that you return for lab work in: [place]    Testing/Procedures: NONE  Follow-Up: Your physician recommends that you schedule a follow-up appointment in: 2 WEEKS    Any Other Special Instructions Will Be Listed Below (If Applicable).     If you need a refill on your cardiac medications before your next appointment, please call your pharmacy.

## 2015-10-16 NOTE — Progress Notes (Signed)
Clinical Summary [name] is a 82 y.o.male seen today as a hospital follow up, this if our first visit together.  1. CAD - admit 09/2015 with chest pain and NSTEMI - cath showed multivessel CAD with LM involvement - had CABG utilizing LIMA to LAD, SVG to distal RCA, and SVG to Acute Marginal. Some component of cardiogenic shock requiring IABP and inotrope support transiently.  - 08/2015 echo LVEF 50-55%, no WMAs.   - recent SOB. No chest pain. DOE with activites, +orthopnea. Increased LE edema. Weight 182 lbs at discharge, today 190 lbs.   2. Afib - bradycardia on amio - not started on anticoag reportedly due to age.  - denies any palpitations since discharge   3. Vocal cord cancer      SH: wife [name] [name] is also a patient of mine.    Past Medical History:  Diagnosis Date  . Coronary atherosclerosis PER CT SCAN DONE 05-09-2011    CALCIFIED PLAQUE  . DJD (degenerative joint disease)   . First degree heart block   . GERD (gastroesophageal reflux disease)   . H/O hiatal hernia   . Heart murmur MILD--  ASYMPTOMATIC  . History of cancer of larynx 1996--- S/P  REMOVAL 1/3 VOICE BOX  AND RADIATION TX   NO RECURRENCE---  RESIDUAL , PT SPEECH A WHISPER  . Hypertension   . Hypothyroidism   . Prostate cancer ([place]) STAGE T2a,     FOLLOWED BY DR Diona Fanti AND DR MANNING  . Weakness of voice PT CAN ONLY WHISPER---  SECONDARY TO VOCAL CORD CA  S/P REMOVAL 1/3  VOICE BOX     Allergies  Allergen Reactions  . Sulfa Antibiotics Palpitations  . Contrast Media [Iodinated Diagnostic Agents] Rash    ivp dye---  Pt states w/ pre-treatment does okay     Current Outpatient Prescriptions  Medication Sig Dispense Refill  . alum & mag hydroxide-simeth (MAALOX/MYLANTA) 200-200-20 MG/5ML suspension Take 30 mLs by mouth every 6 (six) hours as needed for indigestion or heartburn. 355 mL 0  . aspirin EC 81 MG EC tablet Take 1 tablet (81 mg total) by mouth daily.    Marland Kitchen atorvastatin  (LIPITOR) 80 MG tablet Take 1 tablet (80 mg total) by mouth daily at 6 PM. 30 tablet 3  . Bilberry, Vaccinium myrtillus, (BILBERRY PO) Take 2 tablets by mouth daily.     . carvedilol (COREG) 6.25 MG tablet Take 1 tablet (6.25 mg total) by mouth 2 (two) times daily with a meal. 60 tablet 3  . clopidogrel (PLAVIX) 75 MG tablet Take 1 tablet (75 mg total) by mouth daily. 30 tablet 0  . furosemide (LASIX) 40 MG tablet Take 1 tablet (40 mg total) by mouth daily. For 7 Days 7 tablet 0  . GARLIC PO Take 1 tablet by mouth daily.    Marland Kitchen levothyroxine (SYNTHROID, LEVOTHROID) 50 MCG tablet Take 50 mcg by mouth every morning.    Marland Kitchen lisinopril (PRINIVIL,ZESTRIL) 5 MG tablet Take 1 tablet (5 mg total) by mouth daily. 30 tablet 3  . Multiple Vitamins-Minerals (PRESERVISION AREDS 2) CAPS Take 2 capsules by mouth daily.     Marland Kitchen omeprazole (PRILOSEC) 20 MG capsule Take 20 mg by mouth every morning.     . potassium chloride (K-DUR) 10 MEQ tablet Take 1 tablet (10 mEq total) by mouth daily. For 7 Days 7 tablet 0  . traMADol (ULTRAM) 50 MG tablet Take 1-2 tablets (50-100 mg total) by mouth every 4 (  four) hours as needed for moderate pain. 30 tablet 0   No current facility-administered medications for this visit.      Past Surgical History:  Procedure Laterality Date  . CARDIAC CATHETERIZATION N/A 09/18/2015   Procedure: Left Heart Cath and Coronary Angiography;  Surgeon: Troy Sine, MD;  Location: [facility];  Service: Cardiovascular;  Laterality: N/A;  . CARDIAC CATHETERIZATION N/A 09/21/2015   Procedure: IABP Insertion;  Surgeon: Leonie Man, MD;  Location: [facility];  Service: Cardiovascular;  Laterality: N/A;  . CARDIOVASCULAR STRESS TEST  5 YRS AGO   PT STATES NORMAL  . CATARACT EXTRACTION W/ INTRAOCULAR LENS  IMPLANT, BILATERAL    . CORONARY ARTERY BYPASS GRAFT N/A 09/22/2015   Procedure: CORONARY ARTERY BYPASS GRAFTING (CABG) TIMES  THREE  USING LEFT INTERNAL MAMMARY ARTERY AND RIGHT  SAPHENOUS VEIN HARVESTED ENDOSCOPICALLY, CORONARY ENDARTERECTOMY;  Surgeon: Ivin Poot, MD;  Location: [facility];  Service: Open Heart Surgery;  Laterality: N/A;  . CYSTOSCOPY  08/02/2011   Procedure: CYSTOSCOPY FLEXIBLE;  Surgeon: Franchot Gallo, MD;  Location: [facility];  Service: Urology;  Laterality: N/A;  . LARYNX SURGERY  x3  1996   BX'S AND 1/3 OF VOICE BOX REMOVED DUE TO CANCER--  NO RECURRENCE---  (RESIDUAL , WHISPERS)  . RADIOACTIVE SEED IMPLANT  08/02/2011   Procedure: RADIOACTIVE SEED IMPLANT;  Surgeon: Franchot Gallo, MD;  Location: [facility];  Service: Urology;  Laterality: N/A;  C-ARM RAD TECH OK PER BEVERLY AT MAIN  . TEE WITHOUT CARDIOVERSION N/A 09/22/2015   Procedure: TRANSESOPHAGEAL ECHOCARDIOGRAM (TEE);  Surgeon: Ivin Poot, MD;  Location: [facility];  Service: Open Heart Surgery;  Laterality: N/A;     Allergies  Allergen Reactions  . Sulfa Antibiotics Palpitations  . Contrast Media [Iodinated Diagnostic Agents] Rash    ivp dye---  Pt states w/ pre-treatment does okay      Family History No pertinent family history   Social History [name] reports that he quit smoking about 25 years ago. His smoking use included Cigarettes. He has a 135.00 pack-year smoking history. He has never used smokeless tobacco. [name] reports that he does not drink alcohol.   Review of Systems CONSTITUTIONAL: No weight loss, fever, chills, weakness or fatigue.  HEENT: Eyes: No visual loss, blurred vision, double vision or yellow sclerae.No hearing loss, sneezing, congestion, runny nose or sore throat.  SKIN: No rash or itching.  CARDIOVASCULAR: per HPI RESPIRATORY: No shortness of breath, cough or sputum.  GASTROINTESTINAL: No anorexia, nausea, vomiting or diarrhea. No abdominal pain or blood.  GENITOURINARY: No burning on urination, no polyuria NEUROLOGICAL: No headache, dizziness, syncope, paralysis, ataxia, numbness or tingling in the  extremities. No change in bowel or bladder control.  MUSCULOSKELETAL: No muscle, back pain, joint pain or stiffness.  LYMPHATICS: No enlarged nodes. No history of splenectomy.  PSYCHIATRIC: No history of depression or anxiety.  ENDOCRINOLOGIC: No reports of sweating, cold or heat intolerance. No polyuria or polydipsia.  Marland Kitchen   Physical Examination Vitals:   10/16/15 1132  BP: (!) 148/80  Pulse: 73   Vitals:   10/16/15 1132  Weight: 190 lb (86.2 kg)  Height: 5\' 10"  (1.778 m)    Gen: resting comfortably, no acute distress HEENT: no scleral icterus, pupils equal round and reactive, no palptable cervical adenopathy,  CV: RRR, no m/r/g, no jvd Resp: bilateral crackles GI: abdomen is soft, non-tender, non-distended, normal bowel sounds, no hepatosplenomegaly MSK: extremities  are warm, 2+ bilateral LE edema Skin: warm, no rash Neuro:  no focal deficits Psych: appropriate affect    Assessment and Plan  1. CAD - recent CABG, denies any recent chest pain - continue current meds  2. Acute on chronic diastolic HF - start lasix 40mg  daily along with KCl 10 mEq daily - repeat BMET/Mg in 1 week.   3. Post op Afib - no previous history, developed post op afib after CABG. Did not tolerate amio due to bradycardia. Was not started on anticoag at discharge. - EKG today shows NSR, appears post op afib has resolved. Continue to monitor at this time, no indication for anticoagulation at this time unless documented recurrence.     F/u 2 weeks.   Arnoldo Lenis, M.D.

## 2015-10-17 ENCOUNTER — Telehealth: Payer: Self-pay | Admitting: Cardiology

## 2015-10-17 ENCOUNTER — Inpatient Hospital Stay (HOSPITAL_COMMUNITY)
Admission: EM | Admit: 2015-10-17 | Discharge: 2015-10-21 | DRG: 292 | Disposition: A | Discharge status: Home / Self Care | Payer: Medicare HMO | Attending: Family Medicine | Admitting: Family Medicine

## 2015-10-17 ENCOUNTER — Emergency Department (HOSPITAL_COMMUNITY): Payer: Medicare HMO

## 2015-10-17 ENCOUNTER — Telehealth: Payer: Self-pay | Admitting: *Deleted

## 2015-10-17 ENCOUNTER — Encounter (HOSPITAL_COMMUNITY): Payer: Self-pay | Admitting: Emergency Medicine

## 2015-10-17 DIAGNOSIS — I509 Heart failure, unspecified: Secondary | ICD-10-CM

## 2015-10-17 DIAGNOSIS — E440 Moderate protein-calorie malnutrition: Secondary | ICD-10-CM | POA: Diagnosis present

## 2015-10-17 DIAGNOSIS — D6489 Other specified anemias: Secondary | ICD-10-CM | POA: Diagnosis not present

## 2015-10-17 DIAGNOSIS — D638 Anemia in other chronic diseases classified elsewhere: Secondary | ICD-10-CM | POA: Diagnosis present

## 2015-10-17 DIAGNOSIS — Z951 Presence of aortocoronary bypass graft: Secondary | ICD-10-CM

## 2015-10-17 DIAGNOSIS — J449 Chronic obstructive pulmonary disease, unspecified: Secondary | ICD-10-CM | POA: Diagnosis present

## 2015-10-17 DIAGNOSIS — Z8249 Family history of ischemic heart disease and other diseases of the circulatory system: Secondary | ICD-10-CM | POA: Diagnosis not present

## 2015-10-17 DIAGNOSIS — R0600 Dyspnea, unspecified: Secondary | ICD-10-CM

## 2015-10-17 DIAGNOSIS — I110 Hypertensive heart disease with heart failure: Principal | ICD-10-CM | POA: Diagnosis present

## 2015-10-17 DIAGNOSIS — I214 Non-ST elevation (NSTEMI) myocardial infarction: Secondary | ICD-10-CM

## 2015-10-17 DIAGNOSIS — Z8521 Personal history of malignant neoplasm of larynx: Secondary | ICD-10-CM | POA: Diagnosis not present

## 2015-10-17 DIAGNOSIS — Z823 Family history of stroke: Secondary | ICD-10-CM

## 2015-10-17 DIAGNOSIS — I5043 Acute on chronic combined systolic (congestive) and diastolic (congestive) heart failure: Secondary | ICD-10-CM | POA: Diagnosis present

## 2015-10-17 DIAGNOSIS — E038 Other specified hypothyroidism: Secondary | ICD-10-CM

## 2015-10-17 DIAGNOSIS — Z6825 Body mass index (BMI) 25.0-25.9, adult: Secondary | ICD-10-CM | POA: Diagnosis not present

## 2015-10-17 DIAGNOSIS — K219 Gastro-esophageal reflux disease without esophagitis: Secondary | ICD-10-CM | POA: Diagnosis present

## 2015-10-17 DIAGNOSIS — I5031 Acute diastolic (congestive) heart failure: Secondary | ICD-10-CM | POA: Diagnosis not present

## 2015-10-17 DIAGNOSIS — G4700 Insomnia, unspecified: Secondary | ICD-10-CM | POA: Diagnosis present

## 2015-10-17 DIAGNOSIS — Z961 Presence of intraocular lens: Secondary | ICD-10-CM | POA: Diagnosis present

## 2015-10-17 DIAGNOSIS — I2583 Coronary atherosclerosis due to lipid rich plaque: Secondary | ICD-10-CM

## 2015-10-17 DIAGNOSIS — I5033 Acute on chronic diastolic (congestive) heart failure: Secondary | ICD-10-CM | POA: Diagnosis not present

## 2015-10-17 DIAGNOSIS — Z87891 Personal history of nicotine dependence: Secondary | ICD-10-CM

## 2015-10-17 DIAGNOSIS — I5023 Acute on chronic systolic (congestive) heart failure: Secondary | ICD-10-CM

## 2015-10-17 DIAGNOSIS — K2100 Gastro-esophageal reflux disease with esophagitis, without bleeding: Secondary | ICD-10-CM

## 2015-10-17 DIAGNOSIS — E02 Subclinical iodine-deficiency hypothyroidism: Secondary | ICD-10-CM

## 2015-10-17 DIAGNOSIS — I25708 Atherosclerosis of coronary artery bypass graft(s), unspecified, with other forms of angina pectoris: Secondary | ICD-10-CM

## 2015-10-17 DIAGNOSIS — I248 Other forms of acute ischemic heart disease: Secondary | ICD-10-CM | POA: Diagnosis present

## 2015-10-17 DIAGNOSIS — R7989 Other specified abnormal findings of blood chemistry: Secondary | ICD-10-CM | POA: Diagnosis not present

## 2015-10-17 DIAGNOSIS — E871 Hypo-osmolality and hyponatremia: Secondary | ICD-10-CM | POA: Diagnosis present

## 2015-10-17 DIAGNOSIS — I5041 Acute combined systolic (congestive) and diastolic (congestive) heart failure: Secondary | ICD-10-CM | POA: Diagnosis not present

## 2015-10-17 DIAGNOSIS — R778 Other specified abnormalities of plasma proteins: Secondary | ICD-10-CM | POA: Diagnosis present

## 2015-10-17 DIAGNOSIS — I252 Old myocardial infarction: Secondary | ICD-10-CM

## 2015-10-17 DIAGNOSIS — E039 Hypothyroidism, unspecified: Secondary | ICD-10-CM | POA: Diagnosis present

## 2015-10-17 DIAGNOSIS — I2510 Atherosclerotic heart disease of native coronary artery without angina pectoris: Secondary | ICD-10-CM | POA: Diagnosis present

## 2015-10-17 DIAGNOSIS — I5021 Acute systolic (congestive) heart failure: Secondary | ICD-10-CM

## 2015-10-17 DIAGNOSIS — Z8546 Personal history of malignant neoplasm of prostate: Secondary | ICD-10-CM | POA: Diagnosis not present

## 2015-10-17 DIAGNOSIS — I10 Essential (primary) hypertension: Secondary | ICD-10-CM

## 2015-10-17 DIAGNOSIS — I4891 Unspecified atrial fibrillation: Secondary | ICD-10-CM

## 2015-10-17 HISTORY — DX: Presence of aortocoronary bypass graft: Z95.1

## 2015-10-17 HISTORY — DX: Non-ST elevation (NSTEMI) myocardial infarction: I21.4

## 2015-10-17 LAB — BASIC METABOLIC PANEL
ANION GAP: 8 (ref 5–15)
BUN: 13 mg/dL (ref 6–20)
CALCIUM: 7.9 mg/dL — AB (ref 8.9–10.3)
CHLORIDE: 81 mmol/L — AB (ref 101–111)
CO2: 30 mmol/L (ref 22–32)
CREATININE: 0.81 mg/dL (ref 0.61–1.24)
GFR calc non Af Amer: >60 mL/min (ref >60)
Glucose, Bld: 122 mg/dL — ABNORMAL HIGH (ref 65–99)
Potassium: 3.2 mmol/L — ABNORMAL LOW (ref 3.5–5.1)
SODIUM: 119 mmol/L — AB (ref 135–145)

## 2015-10-17 LAB — COMPREHENSIVE METABOLIC PANEL
ALBUMIN: 3.6 g/dL (ref 3.5–5.0)
ALT: 15 U/L — ABNORMAL LOW (ref 17–63)
ANION GAP: 7 (ref 5–15)
AST: 15 U/L (ref 15–41)
Alkaline Phosphatase: 74 U/L (ref 38–126)
BILIRUBIN TOTAL: 0.7 mg/dL (ref 0.3–1.2)
BUN: 13 mg/dL (ref 6–20)
CO2: 25 mmol/L (ref 22–32)
Calcium: 8 mg/dL — ABNORMAL LOW (ref 8.9–10.3)
Chloride: 86 mmol/L — ABNORMAL LOW (ref 101–111)
Creatinine, Ser: 0.75 mg/dL (ref 0.61–1.24)
GFR calc Af Amer: >60 mL/min (ref >60)
GLUCOSE: 118 mg/dL — AB (ref 65–99)
POTASSIUM: 3.9 mmol/L (ref 3.5–5.1)
Sodium: 118 mmol/L — CL (ref 135–145)
TOTAL PROTEIN: 7 g/dL (ref 6.5–8.1)

## 2015-10-17 LAB — URINALYSIS, ROUTINE W REFLEX MICROSCOPIC
BILIRUBIN URINE: NEGATIVE
Glucose, UA: NEGATIVE mg/dL
KETONES UR: NEGATIVE mg/dL
Leukocytes, UA: NEGATIVE
NITRITE: NEGATIVE
Protein, ur: NEGATIVE mg/dL
Specific Gravity, Urine: 1.01 (ref 1.005–1.030)
pH: 5.5 (ref 5.0–8.0)

## 2015-10-17 LAB — URINE MICROSCOPIC-ADD ON

## 2015-10-17 LAB — CBC WITH DIFFERENTIAL/PLATELET
BASOS ABS: 0 10*3/uL (ref 0.0–0.1)
BASOS PCT: 0 %
EOS ABS: 0 10*3/uL (ref 0.0–0.7)
Eosinophils Relative: 0 %
HEMATOCRIT: 31.9 % — AB (ref 39.0–52.0)
HEMOGLOBIN: 10.7 g/dL — AB (ref 13.0–17.0)
Lymphocytes Relative: 7 %
Lymphs Abs: 0.5 10*3/uL — ABNORMAL LOW (ref 0.7–4.0)
MCH: 28.7 pg (ref 26.0–34.0)
MCHC: 33.5 g/dL (ref 30.0–36.0)
MCV: 85.5 fL (ref 78.0–100.0)
MONOS PCT: 14 %
Monocytes Absolute: 1.1 10*3/uL — ABNORMAL HIGH (ref 0.1–1.0)
NEUTROS ABS: 6.3 10*3/uL (ref 1.7–7.7)
NEUTROS PCT: 79 %
Platelets: 360 10*3/uL (ref 150–400)
RBC: 3.73 MIL/uL — AB (ref 4.22–5.81)
RDW: 14.3 % (ref 11.5–15.5)
WBC: 7.9 10*3/uL (ref 4.0–10.5)

## 2015-10-17 LAB — TROPONIN I
TROPONIN I: 0.08 ng/mL — AB (ref <0.03)
TROPONIN I: 0.08 ng/mL — AB (ref <0.03)
Troponin I: 0.09 ng/mL (ref <0.03)

## 2015-10-17 LAB — BRAIN NATRIURETIC PEPTIDE: B NATRIURETIC PEPTIDE 5: 1809 pg/mL — AB (ref 0.0–100.0)

## 2015-10-17 LAB — MRSA PCR SCREENING: MRSA BY PCR: NEGATIVE

## 2015-10-17 LAB — TSH: TSH: 2.282 u[IU]/mL (ref 0.350–4.500)

## 2015-10-17 MED ORDER — CLOPIDOGREL BISULFATE 75 MG PO TABS
75.0000 mg | ORAL_TABLET | Freq: Every day | ORAL | Status: DC
  Administered 2015-10-17 – 2015-10-21 (×5): 75 mg via ORAL
  Filled 2015-10-17 (×5): qty 1

## 2015-10-17 MED ORDER — LEVOTHYROXINE SODIUM 50 MCG PO TABS
50.0000 ug | ORAL_TABLET | Freq: Every day | ORAL | Status: DC
  Administered 2015-10-18 – 2015-10-21 (×4): 50 ug via ORAL
  Filled 2015-10-17 (×3): qty 2
  Filled 2015-10-17: qty 1

## 2015-10-17 MED ORDER — LISINOPRIL 5 MG PO TABS
5.0000 mg | ORAL_TABLET | Freq: Every day | ORAL | Status: DC
  Filled 2015-10-17: qty 1

## 2015-10-17 MED ORDER — ASPIRIN 325 MG PO TABS
325.0000 mg | ORAL_TABLET | Freq: Once | ORAL | Status: AC
  Administered 2015-10-17: 325 mg via ORAL
  Filled 2015-10-17: qty 1

## 2015-10-17 MED ORDER — HEPARIN SODIUM (PORCINE) 5000 UNIT/ML IJ SOLN
5000.0000 [IU] | Freq: Three times a day (TID) | INTRAMUSCULAR | Status: DC
  Administered 2015-10-17 – 2015-10-21 (×11): 5000 [IU] via SUBCUTANEOUS
  Filled 2015-10-17 (×12): qty 1

## 2015-10-17 MED ORDER — FUROSEMIDE 10 MG/ML IJ SOLN
40.0000 mg | Freq: Once | INTRAMUSCULAR | Status: AC
  Administered 2015-10-17: 40 mg via INTRAVENOUS
  Filled 2015-10-17: qty 4

## 2015-10-17 MED ORDER — CIPROFLOXACIN HCL 250 MG PO TABS: 500.0000 mg | ORAL_TABLET | Freq: Two times a day (BID) | ORAL | Status: DC

## 2015-10-17 MED ORDER — ENSURE ENLIVE PO LIQD
237.0000 mL | Freq: Two times a day (BID) | ORAL | Status: DC
  Administered 2015-10-18 – 2015-10-19 (×3): 237 mL via ORAL

## 2015-10-17 MED ORDER — FUROSEMIDE 10 MG/ML IJ SOLN
80.0000 mg | Freq: Two times a day (BID) | INTRAMUSCULAR | Status: DC
  Administered 2015-10-17: 80 mg via INTRAVENOUS
  Filled 2015-10-17 (×2): qty 8

## 2015-10-17 MED ORDER — SODIUM CHLORIDE 0.9% FLUSH
3.0000 mL | Freq: Two times a day (BID) | INTRAVENOUS | Status: DC
  Administered 2015-10-17 – 2015-10-19 (×4): 3 mL via INTRAVENOUS

## 2015-10-17 MED ORDER — DOCUSATE SODIUM 100 MG PO CAPS
100.0000 mg | ORAL_CAPSULE | Freq: Two times a day (BID) | ORAL | Status: DC
  Administered 2015-10-17 – 2015-10-21 (×8): 100 mg via ORAL
  Filled 2015-10-17 (×8): qty 1

## 2015-10-17 MED ORDER — NITROGLYCERIN 2 % TD OINT
0.5000 [in_us] | TOPICAL_OINTMENT | Freq: Three times a day (TID) | TRANSDERMAL | Status: DC
Start: 2015-10-17 — End: 2015-10-18
  Administered 2015-10-17 – 2015-10-18 (×2): 0.5 [in_us] via TOPICAL
  Filled 2015-10-17 (×2): qty 1

## 2015-10-17 MED ORDER — POTASSIUM CHLORIDE CRYS ER 20 MEQ PO TBCR
20.0000 meq | EXTENDED_RELEASE_TABLET | Freq: Two times a day (BID) | ORAL | Status: DC
  Administered 2015-10-17: 20 meq via ORAL
  Filled 2015-10-17: qty 1

## 2015-10-17 MED ORDER — PANTOPRAZOLE SODIUM 40 MG PO TBEC
40.0000 mg | DELAYED_RELEASE_TABLET | Freq: Every day | ORAL | Status: DC
Start: 2015-10-18 — End: 2015-10-21
  Administered 2015-10-18 – 2015-10-21 (×4): 40 mg via ORAL
  Filled 2015-10-17 (×4): qty 1

## 2015-10-17 MED ORDER — ACETAMINOPHEN 325 MG PO TABS: 650.0000 mg | ORAL_TABLET | Freq: Four times a day (QID) | ORAL | Status: DC | PRN

## 2015-10-17 MED ORDER — ONDANSETRON HCL 4 MG/2ML IJ SOLN
4.0000 mg | Freq: Four times a day (QID) | INTRAMUSCULAR | Status: DC | PRN
  Administered 2015-10-18: 4 mg via INTRAVENOUS
  Filled 2015-10-17: qty 2

## 2015-10-17 MED ORDER — ONDANSETRON HCL 4 MG PO TABS: 4.0000 mg | ORAL_TABLET | Freq: Four times a day (QID) | ORAL | Status: DC | PRN

## 2015-10-17 MED ORDER — ASPIRIN EC 81 MG PO TBEC
81.0000 mg | DELAYED_RELEASE_TABLET | Freq: Every day | ORAL | Status: DC
  Administered 2015-10-18 – 2015-10-21 (×4): 81 mg via ORAL
  Filled 2015-10-17 (×4): qty 1

## 2015-10-17 MED ORDER — MORPHINE SULFATE (PF) 2 MG/ML IV SOLN: 2.0000 mg | INTRAVENOUS | Status: DC | PRN

## 2015-10-17 MED ORDER — OCUVITE-LUTEIN PO CAPS
2.0000 | ORAL_CAPSULE | Freq: Every day | ORAL | Status: DC
  Administered 2015-10-19 – 2015-10-21 (×3): 2 via ORAL
  Filled 2015-10-17: qty 2
  Filled 2015-10-17: qty 1
  Filled 2015-10-17: qty 2

## 2015-10-17 MED ORDER — HYDROCODONE-ACETAMINOPHEN 5-325 MG PO TABS: 1.0000 | ORAL_TABLET | ORAL | Status: DC | PRN

## 2015-10-17 MED ORDER — ACETAMINOPHEN 650 MG RE SUPP: 650.0000 mg | Freq: Four times a day (QID) | RECTAL | Status: DC | PRN

## 2015-10-17 MED ORDER — BENZONATATE 100 MG PO CAPS
100.0000 mg | ORAL_CAPSULE | Freq: Three times a day (TID) | ORAL | Status: DC | PRN
  Administered 2015-10-17: 100 mg via ORAL
  Filled 2015-10-17: qty 1

## 2015-10-17 MED ORDER — CARVEDILOL 3.125 MG PO TABS
6.2500 mg | ORAL_TABLET | Freq: Two times a day (BID) | ORAL | Status: DC
  Administered 2015-10-18 – 2015-10-21 (×7): 6.25 mg via ORAL
  Filled 2015-10-17 (×7): qty 2

## 2015-10-17 MED ORDER — ATORVASTATIN CALCIUM 40 MG PO TABS
80.0000 mg | ORAL_TABLET | Freq: Every day | ORAL | Status: DC
  Administered 2015-10-17 – 2015-10-20 (×4): 80 mg via ORAL
  Filled 2015-10-17 (×4): qty 2

## 2015-10-17 MED ORDER — CIPROFLOXACIN HCL 250 MG PO TABS
500.0000 mg | ORAL_TABLET | Freq: Two times a day (BID) | ORAL | Status: AC
  Administered 2015-10-17 – 2015-10-20 (×6): 500 mg via ORAL
  Filled 2015-10-17 (×6): qty 2

## 2015-10-17 MED ORDER — DIPHENHYDRAMINE HCL 12.5 MG/5ML PO ELIX: 12.5000 mg | ORAL_SOLUTION | Freq: Every evening | ORAL | Status: DC | PRN

## 2015-10-17 NOTE — Telephone Encounter (Signed)
Geraldine Solar notified that patient has been approved for addional visits

## 2015-10-17 NOTE — ED Notes (Signed)
Patient transported to X-ray 

## 2015-10-17 NOTE — Telephone Encounter (Signed)
Ok to approve addional visit for his CHF   Zandra Abts MD

## 2015-10-17 NOTE — Telephone Encounter (Signed)
See Hannah's phone nite attached,approve visits if you will    Forward to Dr Harl Bowie

## 2015-10-17 NOTE — ED Triage Notes (Addendum)
PT states he has had SOB for over a week and his heart doctor saw him yesterday and restarted him on lasix medication. His home health nurse came out to see him this am and pt was having more SOB on exertion and productive white thick sputum cough. PT stated they did an EKG yesterday at the cardiology office.

## 2015-10-17 NOTE — H&P (Signed)
History and Physical    [name] [name] [id number] DOB: [birth year] DOA: 10/17/2015  PCP: Maricela Curet, MD  Patient coming from: Home  Chief Complaint: Shortness of breath and swelling.  HPI: PANAGIOTIS [name] is a 82 y.o. male with medical history significant for a history of coronary artery disease, status post ST elevation myocardial infarction, status post 3 vessel CABG 09/22/2015, cancer of the larynx, prostate cancer, hypertension, and hypothyroidism, who presents to the ED with a chief complaint of shortness of breath and swelling. His symptoms started a few days ago and have become progressively worse. He has shortness of breath at rest and worse with activity. He describes PND and orthopnea type symptoms. He has had more swelling in his lower abdomen and his legs. He has had a cough with clear sputum. He was recently seen by his cardiologist, Dr. Harl Bowie yesterday who noted that he had an increase in his weight above baseline. Lasix 40 mg daily was started. Patient denies chest pain. He was recently started on Cipro for presumed UTI due to some hematuria. He has taken 3 doses. He denies diarrhea he did have one episode of emesis following a coughing spell.  ED Course: In the ED, he was afebrile and hemodynamically stable. His lab data were significant for a sodium of 118, hemoglobin is 10.7, troponin I of 0.09, and BNP of 1809. His chest x-ray revealed moderate CHF with moderate interstitial and airspace pulmonary edema and small bilateral pleural effusions. He is being admitted for further evaluation and management.  Review of Systems: As per HPI; he had blood in his urine last week; generalized fatigue; orthopnea symptoms; generalized insomnia, otherwise 10 point review of systems negative.    Past Medical History:  Diagnosis Date  . Coronary artery disease involving native coronary artery of native heart without angina pectoris   . Coronary atherosclerosis PER CT SCAN DONE  05-09-2011    CALCIFIED PLAQUE  . DJD (degenerative joint disease)   . First degree heart block   . GERD (gastroesophageal reflux disease)   . H/O hiatal hernia   . Heart murmur MILD--  ASYMPTOMATIC  . History of cancer of larynx 1996--- S/P  REMOVAL 1/3 VOICE BOX  AND RADIATION TX   NO RECURRENCE---  RESIDUAL , PT SPEECH A WHISPER  . Hypertension   . Hypothyroidism   . Non-STEMI (non-ST elevated myocardial infarction) ([place]) 09/18/2015  . Prostate cancer ([place]) STAGE T2a,     FOLLOWED BY DR Diona Fanti AND DR MANNING  . S/P CABG (coronary artery bypass graft)   . ST elevation (STEMI) myocardial infarction of unspecified site ([place]) 09/18/2015  . Weakness of voice PT CAN ONLY WHISPER---  SECONDARY TO VOCAL CORD CA  S/P REMOVAL 1/3  VOICE BOX    Past Surgical History:  Procedure Laterality Date  . CARDIAC CATHETERIZATION N/A 09/18/2015   Procedure: Left Heart Cath and Coronary Angiography;  Surgeon: Troy Sine, MD;  Location: [facility];  Service: Cardiovascular;  Laterality: N/A;  . CARDIAC CATHETERIZATION N/A 09/21/2015   Procedure: IABP Insertion;  Surgeon: Leonie Man, MD;  Location: [facility];  Service: Cardiovascular;  Laterality: N/A;  . CARDIOVASCULAR STRESS TEST  5 YRS AGO   PT STATES NORMAL  . CATARACT EXTRACTION W/ INTRAOCULAR LENS  IMPLANT, BILATERAL    . CORONARY ARTERY BYPASS GRAFT N/A 09/22/2015   Procedure: CORONARY ARTERY BYPASS GRAFTING (CABG) TIMES  THREE  USING LEFT INTERNAL MAMMARY ARTERY AND RIGHT SAPHENOUS VEIN HARVESTED  ENDOSCOPICALLY, CORONARY ENDARTERECTOMY;  Surgeon: Ivin Poot, MD;  Location: [facility];  Service: Open Heart Surgery;  Laterality: N/A;  . CYSTOSCOPY  08/02/2011   Procedure: CYSTOSCOPY FLEXIBLE;  Surgeon: Franchot Gallo, MD;  Location: [facility];  Service: Urology;  Laterality: N/A;  . LARYNX SURGERY  x3  1996   BX'S AND 1/3 OF VOICE BOX REMOVED DUE TO CANCER--  NO RECURRENCE---  (RESIDUAL , WHISPERS)  .  RADIOACTIVE SEED IMPLANT  08/02/2011   Procedure: RADIOACTIVE SEED IMPLANT;  Surgeon: Franchot Gallo, MD;  Location: [facility];  Service: Urology;  Laterality: N/A;  C-ARM RAD TECH OK PER [facility] AT MAIN  . TEE WITHOUT CARDIOVERSION N/A 09/22/2015   Procedure: TRANSESOPHAGEAL ECHOCARDIOGRAM (TEE);  Surgeon: Ivin Poot, MD;  Location: [facility];  Service: Open Heart Surgery;  Laterality: N/A;    Social history: He is married. He lives in [place]. He has 4 children. One son recently died of cancer. He reports that he quit smoking about 25 years ago. His smoking use included Cigarettes. He has a 135.00 pack-year smoking history. He has never used smokeless tobacco. He reports that he does not drink alcohol or use drugs.  Allergies  Allergen Reactions  . Sulfa Antibiotics Palpitations  . Contrast Media [Iodinated Diagnostic Agents] Rash    ivp dye---  Pt states w/ pre-treatment does okay    Family history: His mother died of congestive heart failure. His father died of a stroke.    Prior to Admission medications   Medication Sig Start Date End Date Taking? Authorizing Provider  aspirin EC 81 MG EC tablet Take 1 tablet (81 mg total) by mouth daily. 09/30/15  Yes Erin R Barrett, PA-C  atorvastatin (LIPITOR) 80 MG tablet Take 1 tablet (80 mg total) by mouth daily at 6 PM. 09/30/15  Yes Erin R Barrett, PA-C  Bilberry, Vaccinium myrtillus, (BILBERRY PO) Take 2 tablets by mouth daily.    Yes Historical Provider, MD  carvedilol (COREG) 6.25 MG tablet Take 1 tablet (6.25 mg total) by mouth 2 (two) times daily with a meal. 09/30/15  Yes Erin R Barrett, PA-C  ciprofloxacin (CIPRO) 500 MG tablet Take 500 mg by mouth 2 (two) times daily.   Yes Historical Provider, MD  clopidogrel (PLAVIX) 75 MG tablet Take 1 tablet (75 mg total) by mouth daily. 09/30/15  Yes Erin R Barrett, PA-C  furosemide (LASIX) 40 MG tablet Take 1 tablet (40 mg total) by mouth daily. 10/16/15 01/14/16 Yes Arnoldo Lenis, MD  GARLIC PO Take 1 tablet by mouth daily.   Yes Historical Provider, MD  levothyroxine (SYNTHROID, LEVOTHROID) 50 MCG tablet Take 50 mcg by mouth every morning.   Yes Historical Provider, MD  lisinopril (PRINIVIL,ZESTRIL) 5 MG tablet Take 1 tablet (5 mg total) by mouth daily. 09/30/15  Yes Erin R Barrett, PA-C  Multiple Vitamins-Minerals (PRESERVISION AREDS 2) CAPS Take 2 capsules by mouth daily.    Yes Historical Provider, MD  omeprazole (PRILOSEC) 20 MG capsule Take 20 mg by mouth every morning.    Yes Historical Provider, MD  potassium chloride (K-DUR) 10 MEQ tablet Take 1 tablet (10 mEq total) by mouth daily. 10/16/15  Yes Arnoldo Lenis, MD  traMADol (ULTRAM) 50 MG tablet Take 1-2 tablets (50-100 mg total) by mouth every 4 (four) hours as needed for moderate pain. 09/30/15  Yes Freddrick March, PA-C    Physical Exam: Vitals:   10/17/15 1645 10/17/15 1700 10/17/15 1715 10/17/15 1730  BP: (!) 138/95 132/87 (!) 141/88   Pulse: 90 92 90 88  Resp: 19 20 (!) 25 20  Temp:      TempSrc:      SpO2: 92% 94% 96% 97%  Weight:      Height:          Constitutional: NAD, calm, comfortable Vitals:   10/17/15 1645 10/17/15 1700 10/17/15 1715 10/17/15 1730  BP: (!) 138/95 132/87 (!) 141/88   Pulse: 90 92 90 88  Resp: 19 20 (!) 25 20  Temp:      TempSrc:      SpO2: 92% 94% 96% 97%  Weight:      Height:       Eyes: PERRL, lids and conjunctivae normal ENMT: Mucous membranes are moist. Posterior pharynx clear of any exudate or lesions.Normal dentition.  Neck: normal, supple, no masses, no thyromegaly Respiratory: Bilateral crackles. Breathing mildly labored. No accessory muscle use.  Cardiovascular: S1, S2, with a soft systolic murmur. 2+ bilateral lower extremity pitting edema. 2+ pedal pulses. No carotid bruits.  Abdomen: no tenderness, no masses palpated. No hepatosplenomegaly. Bowel sounds positive.  Musculoskeletal: no clubbing / cyanosis. No joint deformity upper and lower  extremities. Good ROM, no contractures. Normal muscle tone.  Skin: no rashes, lesions, ulcers. No induration. Well-healed sternotomy scar. Neurologic: CN 2-12 grossly intact. Sensation intact, DTR normal. Strength 5/5 in all 4.  Psychiatric: Slightly sluggish, but alert when questions are asked and oriented 3. Normal mood.    Labs on Admission: I have personally reviewed following labs and imaging studies  CBC:  Recent Labs Lab 10/17/15 1236  WBC 7.9  NEUTROABS 6.3  HGB 10.7*  HCT 31.9*  MCV 85.5  PLT [id number]   Basic Metabolic Panel:  Recent Labs Lab 10/17/15 1236  NA 118*  K 3.9  CL 86*  CO2 25  GLUCOSE 118*  BUN 13  CREATININE 0.75  CALCIUM 8.0*   GFR: Estimated Creatinine Clearance: 69.7 mL/min (by C-G formula based on SCr of 0.8 mg/dL). Liver Function Tests:  Recent Labs Lab 10/17/15 1236  AST 15  ALT 15*  ALKPHOS 74  BILITOT 0.7  PROT 7.0  ALBUMIN 3.6   No results for input(s): LIPASE, AMYLASE in the last 168 hours. No results for input(s): AMMONIA in the last 168 hours. Coagulation Profile: No results for input(s): INR, PROTIME in the last 168 hours. Cardiac Enzymes:  Recent Labs Lab 10/17/15 1236 10/17/15 1707  TROPONINI 0.09* 0.08*   BNP (last 3 results) No results for input(s): PROBNP in the last 8760 hours. HbA1C: No results for input(s): HGBA1C in the last 72 hours. CBG: No results for input(s): GLUCAP in the last 168 hours. Lipid Profile: No results for input(s): CHOL, HDL, LDLCALC, TRIG, CHOLHDL, LDLDIRECT in the last 72 hours. Thyroid Function Tests: No results for input(s): TSH, T4TOTAL, FREET4, T3FREE, THYROIDAB in the last 72 hours. Anemia Panel: No results for input(s): VITAMINB12, FOLATE, FERRITIN, TIBC, IRON, RETICCTPCT in the last 72 hours. Urine analysis:    Component Value Date/Time   COLORURINE YELLOW 09/19/2015 0725   APPEARANCEUR CLEAR 09/19/2015 0725   LABSPEC 1.016 09/19/2015 0725   PHURINE 6.0 09/19/2015 0725    GLUCOSEU NEGATIVE 09/19/2015 0725   HGBUR MODERATE (A) 09/19/2015 0725   BILIRUBINUR NEGATIVE 09/19/2015 0725   KETONESUR NEGATIVE 09/19/2015 0725   PROTEINUR NEGATIVE 09/19/2015 0725   NITRITE NEGATIVE 09/19/2015 0725   LEUKOCYTESUR NEGATIVE 09/19/2015 0725   Sepsis Labs: !!!!!!!!!!!!!!!!!!!!!!!!!!!!!!!!!!!!!!!!!!!! @LABRCNTIP (procalcitonin:4,lacticidven:4) )No results found for  this or any previous visit (from the past 240 hour(s)).   Radiological Exams on Admission: Dg Chest 2 View  Result Date: 10/17/2015 CLINICAL DATA:  82 year old with 1 week history of cough and progressively worsening shortness of breath. Prior CABG. EXAM: CHEST  2 VIEW COMPARISON:  09/28/2015, 09/26/2015 and earlier, including CT chest 09/19/2015. FINDINGS: Sternotomy for CABG. Cardiac silhouette mildly to moderately enlarged, unchanged. Diffuse interstitial and airspace pulmonary edema, worse than on the most recent prior examination 09/28/2015. Small bilateral pleural effusions. Stable chronic elevation of the right hemidiaphragm. Visualized bony thorax intact. IMPRESSION: Stable cardiomegaly. Moderate CHF, with moderate interstitial and airspace pulmonary edema and small bilateral pleural effusions. Electronically Signed   By: Evangeline Dakin M.D.   On: 10/17/2015 12:27   EKG: Independently reviewed.   Assessment/Plan Principal Problem:   Acute CHF (congestive heart failure) (HCC) Active Problems:   Hyponatremia   Coronary artery disease involving native coronary artery of native heart without angina pectoris   S/P CABG (coronary artery bypass graft)   Elevated troponin I level   Anemia due to other cause   Hypothyroidism    This is an 82 year old man with recent history of three-vessel CABG, who is had progressive PND, orthopnea, shortness of breath, and body swelling. His EF prior to the CABG was 50-55% per echo 08/2015. I suspect that he has acute systolic congestive heart failure. The elevated  troponin I is likely secondary to congestive heart failure. His hyponatremia is likely from volume overload as a consequence of CHF. His serum sodium was 133 2 weeks ago. He was given a total of 80 mg of Lasix in the ED with more than 1 L diuresis so far.   Plan: 1. The patient has been admitted to the stepdown unit. Will insert a Foley catheter for strict ins and outs. 2. Will continue Lasix at 80 mg IV every 12 hours. Will add nitroglycerin paste 1/2 inch every 8 hours 24 hours. We'll continue ACE inhibitor and beta blocker-restart beta blocker tomorrow morning 10/18/15. 3. Will continue his statin, aspirin and Plavix for treatment of his coronary artery disease.  4. For further evaluation, will cycle cardiac enzymes, check a TSH, and reassess his EF with another echo. We'll recheck his serum sodium tonight and then again daily 5 days to ensure improvement in his serum sodium. If his serum sodium does not improve with diuresis, would order urine electrolytes and other studies. 5. Will consult cardiology.    DVT prophylaxis: Heparin Code Status: Full code Family Communication: Discussed with daughters Disposition Plan: To be determined Consults called: Cardiology, pending for 10/18/15 Admission status: Inpatient stepdown   [facility] MD Pager [phone] If 7PM-7AM, please contact night-coverage [email or web address] Password [facility]  10/17/2015, 6:06 PM

## 2015-10-17 NOTE — Progress Notes (Signed)
Myself and another nurse Attempted 14 and 16 fr foley, was unable to place successfully, met resistance. Paged Dr. Caryn Section no return call yet. Pt voiding well on his own so will continue strict I&Os.

## 2015-10-17 NOTE — ED Provider Notes (Signed)
Emergency Department Provider Note   By signing my name below, I, Emmanuella Mensah, attest that this documentation has been prepared under the direction and in the presence of Margette Fast, MD. Electronically Signed: Judithann Sauger, ED Scribe. 10/17/15. 1:52 PM.    HISTORY  Chief Complaint Shortness of Breath  HPI Comments: [name] [name] is a 82 y.o. male with a hx of hypertension, prostate cancer, and first degree heart block who presents to the Emergency Department complaining of ongoing moderate shortness of breath onset one week ago. He reports associated nausea. Family explains that pt has not been able to lay down flat. Pt has a hx of CABG on 09/22/15 and he saw his Cardiologist yesterday and was placed on Lasix and Potassium due to 10-12 lbs fluid build-up. He then saw his PCP this am who advised pt to come here. No alleviating factors noted. He denies any chest pain or vomiting.    Past Medical History:  Diagnosis Date  . Coronary atherosclerosis PER CT SCAN DONE 05-09-2011    CALCIFIED PLAQUE  . DJD (degenerative joint disease)   . First degree heart block   . GERD (gastroesophageal reflux disease)   . H/O hiatal hernia   . Heart murmur MILD--  ASYMPTOMATIC  . History of cancer of larynx 1996--- S/P  REMOVAL 1/3 VOICE BOX  AND RADIATION TX   NO RECURRENCE---  RESIDUAL , PT SPEECH A WHISPER  . Hypertension   . Hypothyroidism   . Prostate cancer ([place]) STAGE T2a,     FOLLOWED BY DR Diona Fanti AND DR MANNING  . Weakness of voice PT CAN ONLY WHISPER---  SECONDARY TO VOCAL CORD CA  S/P REMOVAL 1/3  VOICE BOX    Patient Active Problem List   Diagnosis Date Noted  . S/P CABG (coronary artery bypass graft)   . CAD (coronary artery disease) 09/22/2015  . Coronary artery disease involving native coronary artery of native heart without angina pectoris   . Postinfarction angina ([place])   . Non-STEMI (non-ST elevated myocardial infarction) ([place]) 09/18/2015  . Hypertension  09/18/2015  . GERD (gastroesophageal reflux disease) 09/18/2015  . NSTEMI (non-ST elevated myocardial infarction) ([place]) 09/18/2015  . ST elevation (STEMI) myocardial infarction of unspecified site ([place]) 09/18/2015  . ST elevation myocardial infarction (STEMI) ([place])   . Malignant neoplasm of prostate ([place]) 08/22/2011    Past Surgical History:  Procedure Laterality Date  . CARDIAC CATHETERIZATION N/A 09/18/2015   Procedure: Left Heart Cath and Coronary Angiography;  Surgeon: Troy Sine, MD;  Location: [facility];  Service: Cardiovascular;  Laterality: N/A;  . CARDIAC CATHETERIZATION N/A 09/21/2015   Procedure: IABP Insertion;  Surgeon: Leonie Man, MD;  Location: [facility];  Service: Cardiovascular;  Laterality: N/A;  . CARDIOVASCULAR STRESS TEST  5 YRS AGO   PT STATES NORMAL  . CATARACT EXTRACTION W/ INTRAOCULAR LENS  IMPLANT, BILATERAL    . CORONARY ARTERY BYPASS GRAFT N/A 09/22/2015   Procedure: CORONARY ARTERY BYPASS GRAFTING (CABG) TIMES  THREE  USING LEFT INTERNAL MAMMARY ARTERY AND RIGHT SAPHENOUS VEIN HARVESTED ENDOSCOPICALLY, CORONARY ENDARTERECTOMY;  Surgeon: Ivin Poot, MD;  Location: [facility];  Service: Open Heart Surgery;  Laterality: N/A;  . CYSTOSCOPY  08/02/2011   Procedure: CYSTOSCOPY FLEXIBLE;  Surgeon: Franchot Gallo, MD;  Location: [facility];  Service: Urology;  Laterality: N/A;  . LARYNX SURGERY  x3  1996   BX'S AND 1/3 OF VOICE BOX REMOVED DUE TO CANCER--  NO RECURRENCE---  (RESIDUAL , WHISPERS)  . RADIOACTIVE SEED IMPLANT  08/02/2011   Procedure: RADIOACTIVE SEED IMPLANT;  Surgeon: Franchot Gallo, MD;  Location: [facility];  Service: Urology;  Laterality: N/A;  C-ARM RAD TECH OK PER BEVERLY AT MAIN  . TEE WITHOUT CARDIOVERSION N/A 09/22/2015   Procedure: TRANSESOPHAGEAL ECHOCARDIOGRAM (TEE);  Surgeon: Ivin Poot, MD;  Location: [place];  Service: Open Heart Surgery;  Laterality: N/A;    Current Outpatient  Rx  . Order #: [id number] Class: No Print  . Order #: [id number] Class: Print  . Order #: [id number] Class: Historical Med  . Order #: [id number] Class: Print  . Order #: [id number] Class: Print  . Order #: [id number] Class: Normal  . Order #: [id number] Class: Historical Med  . Order #: [id number] Class: Historical Med  . Order #: [id number] Class: Print  . Order #: [id number] Class: Historical Med  . Order #: [id number] Class: Historical Med  . Order #: [id number] Class: Normal  . Order #: [id number] Class: Print    Allergies Sulfa antibiotics and Contrast media [iodinated diagnostic agents]  History reviewed. No pertinent family history.  Social History Social History  Substance Use Topics  . Smoking status: Former Smoker    Packs/day: 3.00    Years: 45.00    Types: Cigarettes    Quit date: 07/31/1990  . Smokeless tobacco: Never Used  . Alcohol use No    Review of Systems Constitutional: No fever/chills Eyes: No visual changes. ENT: No sore throat. Cardiovascular: Denies chest pain. Respiratory: Shortness of breath. Gastrointestinal: No abdominal pain.  Nausea, no vomiting.  No diarrhea.  No constipation. Genitourinary: Negative for dysuria. Musculoskeletal: Negative for back pain. Skin: Negative for rash. Neurological: Negative for headaches, focal weakness or numbness.   10-point ROS otherwise negative.  ____________________________________________   PHYSICAL EXAM:  VITAL SIGNS: ED Triage Vitals  Enc Vitals Group     BP 10/17/15 1201 131/73     Pulse Rate 10/17/15 1201 74     Resp 10/17/15 1201 22     Temp 10/17/15 1201 (!) 96.6 F (35.9 C)     Temp Source 10/17/15 1201 Temporal     SpO2 10/17/15 1201 97 %     Weight 10/17/15 1201 190 lb (86.2 kg)     Height 10/17/15 1201 5\' 10"  (1.778 m)     Pain Score 10/17/15 1204 0   Constitutional: Alert and oriented. Sitting upright with mild/moderate respiratory distress.  Eyes: Conjunctivae are normal. PERRL. EOMI. Head:  Atraumatic. Nose: No congestion/rhinnorhea. Mouth/Throat: Mucous membranes are moist.  Oropharynx non-erythematous. Neck: No stridor.  Cardiovascular: Normal rate, regular rhythm. Good peripheral circulation. Grossly normal heart sounds.   Respiratory: Increased respiratory effort.  No retractions. Lungs with diminished sounds at the bases and rales.  Gastrointestinal: Soft and nontender. No distention.  Musculoskeletal: No lower extremity tenderness with bilateral 4+ pitting edema. No gross deformities of extremities. Neurologic:  Normal speech and language. No gross focal neurologic deficits are appreciated.  Skin:  Skin is warm, dry and intact. No rash noted. Psychiatric: Mood and affect are normal. Speech and behavior are normal.  ____________________________________________  DIAGNOSTIC STUDIES: Oxygen Saturation is 97% on RA, normal by my interpretation.    COORDINATION OF CARE: 1:41 PM- Pt advised of plan for treatment and pt agrees. Pt informed of his lab results. Will consult pt's Cardiologist for plans for admission.    LABS (all labs ordered are listed, but only abnormal results are displayed)  Labs Reviewed  CBC WITH DIFFERENTIAL/PLATELET - Abnormal; Notable for the  following:       Result Value   RBC 3.73 (*)    Hemoglobin 10.7 (*)    HCT 31.9 (*)    Lymphs Abs 0.5 (*)    Monocytes Absolute 1.1 (*)    All other components within normal limits  COMPREHENSIVE METABOLIC PANEL - Abnormal; Notable for the following:    Sodium 118 (*)    Chloride 86 (*)    Glucose, Bld 118 (*)    Calcium 8.0 (*)    ALT 15 (*)    All other components within normal limits  BRAIN NATRIURETIC PEPTIDE - Abnormal; Notable for the following:    B Natriuretic Peptide 1,809.0 (*)    All other components within normal limits  TROPONIN I - Abnormal; Notable for the following:    Troponin I 0.09 (*)    All other components within normal limits    ____________________________________________  EKG  Reviewed in MUSE. No STEMI.  ____________________________________________  RADIOLOGY  Dg Chest 2 View  Result Date: 10/17/2015 CLINICAL DATA:  82 year old with 1 week history of cough and progressively worsening shortness of breath. Prior CABG. EXAM: CHEST  2 VIEW COMPARISON:  09/28/2015, 09/26/2015 and earlier, including CT chest 09/19/2015. FINDINGS: Sternotomy for CABG. Cardiac silhouette mildly to moderately enlarged, unchanged. Diffuse interstitial and airspace pulmonary edema, worse than on the most recent prior examination 09/28/2015. Small bilateral pleural effusions. Stable chronic elevation of the right hemidiaphragm. Visualized bony thorax intact. IMPRESSION: Stable cardiomegaly. Moderate CHF, with moderate interstitial and airspace pulmonary edema and small bilateral pleural effusions. Electronically Signed   By: Evangeline Dakin M.D.   On: 10/17/2015 12:27   ____________________________________________   PROCEDURES  Procedure(s) performed:   Procedures  None ____________________________________________   INITIAL IMPRESSION / ASSESSMENT AND PLAN / ED COURSE  Pertinent labs & imaging results that were available during my care of the patient were reviewed by me and considered in my medical decision making (see chart for details).  Patient is an 82 year old male with past medical history of recent CABG and ischemic cardiomyopathy. He has had increased weight gain and dyspnea. No chest pain. Labs performed in triage showed elevated troponin and hyponatremia. Glucose 118. Suspect volume overload clinically. She has elevated BNP and congestion on CXR. Patient with significant volume overload clinically and by labs. Suspect that this fluid burden is the cause of his mild elevated troponin rather that acute ischemia.   02:35 PM Spoke with Cardiology regarding presentation and elevated troponin. Plan for hospitalist  admission and biomarker trending. Patient is breathing comfortably at this time. No indication for BIPAP for other airway support. Will page the hospitalist regarding admission.   Spoke with hospitalist regarding admission. Will place temporary orders for stepdown and give additional IV lasix.  ____________________________________________  FINAL CLINICAL IMPRESSION(S) / ED DIAGNOSES  Final diagnoses:  Dyspnea  Acute on chronic congestive heart failure, unspecified congestive heart failure type ([place])   I was present during this encounter and performed all aspects of the HPI, ROS, and PE. I have reviewed the above note for accuracy.    MEDICATIONS GIVEN DURING THIS VISIT:  Lasix 40 mg IV x 2  NEW OUTPATIENT MEDICATIONS STARTED DURING THIS VISIT:  None   Note:  This document was prepared using Dragon voice recognition software and may include unintentional dictation errors.  Nanda Quinton, MD Emergency Medicine    Margette Fast, MD 10/18/15 [phone]

## 2015-10-17 NOTE — Consult Note (Signed)
CARDIOLOGY CONSULT NOTE  Patient ID: [name] [name] MRN: [id number] DOB/AGE: December 26, 1928 82 y.o.  Admit date: 10/17/2015 Primary Physician: Maricela Curet, MD Referring Physician: Caryn Section MD  Reason for Consultation: CHF, CAD  HPI: 82 yr old male with PMH significant for NSTEMI and CAD with recent 3-vessel CABG (left internal mammary artery to left anterior descending artery, saphenous vein graft to distal right coronary artery, saphenous vein graft to circumflex marginal) and post-op atrial fibrillation admitted with progressive exertional dyspnea, leg swelling, orthopnea, and PND over past several days. Saw Dr. Harl Bowie yesterday who initiated Lasix 40 mg for acute on chronic diastolic heart failure. He denies chest pain.  08/2015 Echocardiogram showed LVEF 50-55%, no WMAs.   Has been started on IV Lasix and nursing informs me has put out at least 1.1 L of urine already.  The patient reports an improvement in symptoms.    Allergies  Allergen Reactions  . Sulfa Antibiotics Palpitations  . Contrast Media [Iodinated Diagnostic Agents] Rash    ivp dye---  Pt states w/ pre-treatment does okay    Current Facility-Administered Medications  Medication Dose Route Frequency Provider Last Rate Last Dose  . acetaminophen (TYLENOL) tablet 650 mg  650 mg Oral Q6H PRN Rexene Alberts, MD       Or  . acetaminophen (TYLENOL) suppository 650 mg  650 mg Rectal Q6H PRN Rexene Alberts, MD      . Derrill Memo ON 10/18/2015] aspirin EC tablet 81 mg  81 mg Oral Daily Rexene Alberts, MD      . atorvastatin (LIPITOR) tablet 80 mg  80 mg Oral q1800 Rexene Alberts, MD      . benzonatate (TESSALON) capsule 100 mg  100 mg Oral TID PRN Rexene Alberts, MD      . Derrill Memo ON 10/18/2015] carvedilol (COREG) tablet 6.25 mg  6.25 mg Oral BID WC Rexene Alberts, MD      . ciprofloxacin (CIPRO) tablet 500 mg  500 mg Oral BID Rexene Alberts, MD      . clopidogrel (PLAVIX) tablet 75 mg  75 mg Oral Daily Rexene Alberts, MD       . diphenhydrAMINE (BENADRYL) 12.5 MG/5ML elixir 12.5 mg  12.5 mg Oral QHS PRN Rexene Alberts, MD      . docusate sodium (COLACE) capsule 100 mg  100 mg Oral BID Rexene Alberts, MD      . Derrill Memo ON 10/18/2015] feeding supplement (ENSURE ENLIVE) (ENSURE ENLIVE) liquid 237 mL  237 mL Oral BID BM Rexene Alberts, MD      . furosemide (LASIX) injection 80 mg  80 mg Intravenous Q12H Rexene Alberts, MD      . heparin injection 5,000 Units  5,000 Units Subcutaneous Q8H Rexene Alberts, MD      . HYDROcodone-acetaminophen (NORCO/VICODIN) 5-325 MG per tablet 1 tablet  1 tablet Oral Q4H PRN Rexene Alberts, MD      . Derrill Memo ON 10/18/2015] levothyroxine (SYNTHROID, LEVOTHROID) tablet 50 mcg  50 mcg Oral Q breakfast Rexene Alberts, MD      . Derrill Memo ON 10/18/2015] lisinopril (PRINIVIL,ZESTRIL) tablet 5 mg  5 mg Oral Daily Rexene Alberts, MD      . morphine 2 MG/ML injection 2 mg  2 mg Intravenous Q2H PRN Rexene Alberts, MD      . Derrill Memo ON 10/19/2015] multivitamin-lutein (OCUVITE-LUTEIN) capsule 2 capsule  2 capsule Oral Daily Rexene Alberts, MD      . nitroGLYCERIN (NITROGLYN) 2 % ointment 0.5 inch  0.5 inch Topical  Q8H Rexene Alberts, MD      . ondansetron [facility]) tablet 4 mg  4 mg Oral Q6H PRN Rexene Alberts, MD       Or  . ondansetron [facility]) injection 4 mg  4 mg Intravenous Q6H PRN Rexene Alberts, MD      . Derrill Memo ON 10/18/2015] pantoprazole (PROTONIX) EC tablet 40 mg  40 mg Oral Daily Rexene Alberts, MD      . potassium chloride SA (K-DUR,KLOR-CON) CR tablet 20 mEq  20 mEq Oral BID Rexene Alberts, MD      . sodium chloride flush (NS) 0.9 % injection 3 mL  3 mL Intravenous Q12H Rexene Alberts, MD        Past Medical History:  Diagnosis Date  . Coronary artery disease involving native coronary artery of native heart without angina pectoris   . Coronary atherosclerosis PER CT SCAN DONE 05-09-2011    CALCIFIED PLAQUE  . DJD (degenerative joint disease)   . First degree heart block   . GERD (gastroesophageal reflux  disease)   . H/O hiatal hernia   . Heart murmur MILD--  ASYMPTOMATIC  . History of cancer of larynx 1996--- S/P  REMOVAL 1/3 VOICE BOX  AND RADIATION TX   NO RECURRENCE---  RESIDUAL , PT SPEECH A WHISPER  . Hypertension   . Hypothyroidism   . Non-STEMI (non-ST elevated myocardial infarction) ([place]) 09/18/2015  . Prostate cancer ([place]) STAGE T2a,     FOLLOWED BY DR Diona Fanti AND DR MANNING  . S/P CABG (coronary artery bypass graft)   . ST elevation (STEMI) myocardial infarction of unspecified site ([place]) 09/18/2015  . Weakness of voice PT CAN ONLY WHISPER---  SECONDARY TO VOCAL CORD CA  S/P REMOVAL 1/3  VOICE BOX    Past Surgical History:  Procedure Laterality Date  . CARDIAC CATHETERIZATION N/A 09/18/2015   Procedure: Left Heart Cath and Coronary Angiography;  Surgeon: Troy Sine, MD;  Location: [facility];  Service: Cardiovascular;  Laterality: N/A;  . CARDIAC CATHETERIZATION N/A 09/21/2015   Procedure: IABP Insertion;  Surgeon: Leonie Man, MD;  Location: [place] [facility];  Service: Cardiovascular;  Laterality: N/A;  . CARDIOVASCULAR STRESS TEST  5 YRS AGO   PT STATES NORMAL  . CATARACT EXTRACTION W/ INTRAOCULAR LENS  IMPLANT, BILATERAL    . CORONARY ARTERY BYPASS GRAFT N/A 09/22/2015   Procedure: CORONARY ARTERY BYPASS GRAFTING (CABG) TIMES  THREE  USING LEFT INTERNAL MAMMARY ARTERY AND RIGHT SAPHENOUS VEIN HARVESTED ENDOSCOPICALLY, CORONARY ENDARTERECTOMY;  Surgeon: Ivin Poot, MD;  Location: [place];  Service: Open Heart Surgery;  Laterality: N/A;  . CYSTOSCOPY  08/02/2011   Procedure: CYSTOSCOPY FLEXIBLE;  Surgeon: Franchot Gallo, MD;  Location: [facility];  Service: Urology;  Laterality: N/A;  . LARYNX SURGERY  x3  1996   BX'S AND 1/3 OF VOICE BOX REMOVED DUE TO CANCER--  NO RECURRENCE---  (RESIDUAL , WHISPERS)  . RADIOACTIVE SEED IMPLANT  08/02/2011   Procedure: RADIOACTIVE SEED IMPLANT;  Surgeon: Franchot Gallo, MD;  Location: [facility];  Service: Urology;  Laterality: N/A;  C-ARM RAD TECH OK PER BEVERLY AT MAIN  . TEE WITHOUT CARDIOVERSION N/A 09/22/2015   Procedure: TRANSESOPHAGEAL ECHOCARDIOGRAM (TEE);  Surgeon: Ivin Poot, MD;  Location: [facility];  Service: Open Heart Surgery;  Laterality: N/A;    Social History   Social History  . Marital status: Married    Spouse name: N/A  . Number of children: N/A  .  Years of education: N/A   Occupational History  . Not on file.   Social History Main Topics  . Smoking status: Former Smoker    Packs/day: 3.00    Years: 45.00    Types: Cigarettes    Quit date: 07/31/1990  . Smokeless tobacco: Never Used  . Alcohol use No  . Drug use: No  . Sexual activity: Not on file   Other Topics Concern  . Not on file   Social History Narrative  . No narrative on file     No family history of premature CAD in 1st degree relatives.  Prior to Admission medications   Medication Sig Start Date End Date Taking? Authorizing Provider  aspirin EC 81 MG EC tablet Take 1 tablet (81 mg total) by mouth daily. 09/30/15  Yes Erin R Barrett, PA-C  atorvastatin (LIPITOR) 80 MG tablet Take 1 tablet (80 mg total) by mouth daily at 6 PM. 09/30/15  Yes Erin R Barrett, PA-C  Bilberry, Vaccinium myrtillus, (BILBERRY PO) Take 2 tablets by mouth daily.    Yes Historical Provider, MD  carvedilol (COREG) 6.25 MG tablet Take 1 tablet (6.25 mg total) by mouth 2 (two) times daily with a meal. 09/30/15  Yes Erin R Barrett, PA-C  ciprofloxacin (CIPRO) 500 MG tablet Take 500 mg by mouth 2 (two) times daily.   Yes Historical Provider, MD  clopidogrel (PLAVIX) 75 MG tablet Take 1 tablet (75 mg total) by mouth daily. 09/30/15  Yes Erin R Barrett, PA-C  furosemide (LASIX) 40 MG tablet Take 1 tablet (40 mg total) by mouth daily. 10/16/15 01/14/16 Yes Arnoldo Lenis, MD  GARLIC PO Take 1 tablet by mouth daily.   Yes Historical Provider, MD  levothyroxine (SYNTHROID, LEVOTHROID) 50 MCG tablet Take 50 mcg  by mouth every morning.   Yes Historical Provider, MD  lisinopril (PRINIVIL,ZESTRIL) 5 MG tablet Take 1 tablet (5 mg total) by mouth daily. 09/30/15  Yes Erin R Barrett, PA-C  Multiple Vitamins-Minerals (PRESERVISION AREDS 2) CAPS Take 2 capsules by mouth daily.    Yes Historical Provider, MD  omeprazole (PRILOSEC) 20 MG capsule Take 20 mg by mouth every morning.    Yes Historical Provider, MD  potassium chloride (K-DUR) 10 MEQ tablet Take 1 tablet (10 mEq total) by mouth daily. 10/16/15  Yes Arnoldo Lenis, MD  traMADol (ULTRAM) 50 MG tablet Take 1-2 tablets (50-100 mg total) by mouth every 4 (four) hours as needed for moderate pain. 09/30/15  Yes Erin R Barrett, PA-C     Review of systems complete and found to be negative unless listed above in HPI     Physical exam Blood pressure (!) 141/88, pulse 88, temperature (!) 96.9 F (36.1 C), temperature source Oral, resp. rate 20, height 5\' 10"  (1.778 m), weight 184 lb 4.9 oz (83.6 kg), SpO2 97 %. General: NAD Neck: +JVD, no thyromegaly or thyroid nodule.  Lungs: Bilateral rales. CV: Nondisplaced PMI. Regular rate and rhythm, normal S1/S2, no S3/S4, no murmur.  2+ pitting pretibial edema.  Abdomen: Soft, nontender, no distention.  Skin: Intact without lesions or rashes.  Neurologic: Alert and oriented. Psych: Normal affect. HEENT: EOMI  ECG: Most recent ECG reviewed.  Labs:   Lab Results  Component Value Date   WBC 7.9 10/17/2015   HGB 10.7 (L) 10/17/2015   HCT 31.9 (L) 10/17/2015   MCV 85.5 10/17/2015   PLT 360 10/17/2015    Recent Labs Lab 10/17/15 1236  NA 118*  K 3.9  CL 86*  CO2 25  BUN 13  CREATININE 0.75  CALCIUM 8.0*  PROT 7.0  BILITOT 0.7  ALKPHOS 74  ALT 15*  AST 15  GLUCOSE 118*   Lab Results  Component Value Date   TROPONINI 0.09 (HH) 10/17/2015    Lab Results  Component Value Date   CHOL 164 09/18/2015   Lab Results  Component Value Date   HDL 33 (L) 09/18/2015   Lab Results  Component Value  Date   LDLCALC 116 (H) 09/18/2015   Lab Results  Component Value Date   TRIG 77 09/18/2015   Lab Results  Component Value Date   CHOLHDL 5.0 09/18/2015   No results found for: LDLDIRECT       Studies: Dg Chest 2 View  Result Date: 10/17/2015 CLINICAL DATA:  82 year old with 1 week history of cough and progressively worsening shortness of breath. Prior CABG. EXAM: CHEST  2 VIEW COMPARISON:  09/28/2015, 09/26/2015 and earlier, including CT chest 09/19/2015. FINDINGS: Sternotomy for CABG. Cardiac silhouette mildly to moderately enlarged, unchanged. Diffuse interstitial and airspace pulmonary edema, worse than on the most recent prior examination 09/28/2015. Small bilateral pleural effusions. Stable chronic elevation of the right hemidiaphragm. Visualized bony thorax intact. IMPRESSION: Stable cardiomegaly. Moderate CHF, with moderate interstitial and airspace pulmonary edema and small bilateral pleural effusions. Electronically Signed   By: Evangeline Dakin M.D.   On: 10/17/2015 12:27   ASSESSMENT AND PLAN:  1. Acute on chronic diastolic heart failure: Already has had symptomatic improvement on current IV Lasix regimen. Would continue. BP has improved with diuresis. No need to repeat echo.  2. Essential HTN: Presently normal after diuresis. No changes to medication regimen.  3. CAD with h/o NSTEMI and recent 3-vessel CABG: Symptomatically stable. Insignificant elevation of troponins at this time consistent with demand ischemia.  Continue ASA, Lipitor, Plavix, lisinopril, and Coreg.  4. Post-op atrial fibrillation: Would not anticoagulate at this time. Currently in sinus rhythm. Became bradycardic with amiodarone. Continue Coreg.  Signed: Kate Sable, M.D., F.A.C.C.  10/17/2015, 6:03 PM

## 2015-10-18 ENCOUNTER — Inpatient Hospital Stay (HOSPITAL_COMMUNITY): Payer: Medicare HMO

## 2015-10-18 DIAGNOSIS — I509 Heart failure, unspecified: Secondary | ICD-10-CM

## 2015-10-18 DIAGNOSIS — I5033 Acute on chronic diastolic (congestive) heart failure: Secondary | ICD-10-CM

## 2015-10-18 DIAGNOSIS — R7989 Other specified abnormal findings of blood chemistry: Secondary | ICD-10-CM

## 2015-10-18 DIAGNOSIS — E871 Hypo-osmolality and hyponatremia: Secondary | ICD-10-CM

## 2015-10-18 DIAGNOSIS — E876 Hypokalemia: Secondary | ICD-10-CM

## 2015-10-18 LAB — CBC
HEMATOCRIT: 30.6 % — AB (ref 39.0–52.0)
HEMOGLOBIN: 10.2 g/dL — AB (ref 13.0–17.0)
MCH: 28.4 pg (ref 26.0–34.0)
MCHC: 33.3 g/dL (ref 30.0–36.0)
MCV: 85.2 fL (ref 78.0–100.0)
Platelets: 305 10*3/uL (ref 150–400)
RBC: 3.59 MIL/uL — ABNORMAL LOW (ref 4.22–5.81)
RDW: 14.1 % (ref 11.5–15.5)
WBC: 8.2 10*3/uL (ref 4.0–10.5)

## 2015-10-18 LAB — ECHOCARDIOGRAM COMPLETE
AVLVOTPG: 3 mmHg
CHL CUP DOP CALC LVOT VTI: 17.5 cm
CHL CUP RV SYS PRESS: 55 mmHg
CHL CUP STROKE VOLUME: 51 mL
CHL CUP TV REG PEAK VELOCITY: 315 cm/s
EERAT: 18.09
EWDT: 183 ms
FS: 9 % — AB (ref 28–44)
HEIGHTINCHES: 70 in
IVS/LV PW RATIO, ED: 1.32
LA ID, A-P, ES: 48 mm
LA diam index: 2.36 cm/m2
LA vol A4C: 74.4 ml
LA vol index: 41.6 mL/m2
LAVOL: 84.5 mL
LDCA: 2.84 cm2
LEFT ATRIUM END SYS DIAM: 48 mm
LV E/e' medial: 18.09
LV E/e'average: 18.09
LV SIMPSON'S DISK: 40
LV TDI E'LATERAL: 6.08
LV sys vol index: 37 mL/m2
LVDIAVOL: 127 mL (ref 62–150)
LVDIAVOLIN: 62 mL/m2
LVELAT: 6.08 cm/s
LVOT SV: 50 mL
LVOT diameter: 19 mm
LVOTPV: 88.1 cm/s
LVSYSVOL: 76 mL — AB (ref 21–61)
MV Dec: 183
MV Peak grad: 5 mmHg
MV pk E vel: 110 m/s
MVPKAVEL: 72.8 m/s
P 1/2 time: 186 ms
PW: 10.6 mm — AB (ref 0.6–1.1)
RV LATERAL S' VELOCITY: 7.7 cm/s
TAPSE: 15.8 mm
TDI e' medial: 4.14
TR max vel: 315 cm/s
WEIGHTICAEL: 2917.13 [oz_av]

## 2015-10-18 LAB — BASIC METABOLIC PANEL
Anion gap: 7 (ref 5–15)
BUN: 12 mg/dL (ref 6–20)
CALCIUM: 7.7 mg/dL — AB (ref 8.9–10.3)
CO2: 32 mmol/L (ref 22–32)
CREATININE: 0.78 mg/dL (ref 0.61–1.24)
Chloride: 81 mmol/L — ABNORMAL LOW (ref 101–111)
GFR calc Af Amer: >60 mL/min (ref >60)
GLUCOSE: 94 mg/dL (ref 65–99)
Potassium: 2.9 mmol/L — ABNORMAL LOW (ref 3.5–5.1)
Sodium: 120 mmol/L — ABNORMAL LOW (ref 135–145)

## 2015-10-18 LAB — MAGNESIUM: Magnesium: 1.4 mg/dL — ABNORMAL LOW (ref 1.7–2.4)

## 2015-10-18 LAB — TROPONIN I: TROPONIN I: 0.07 ng/mL — AB (ref <0.03)

## 2015-10-18 MED ORDER — POTASSIUM CHLORIDE CRYS ER 20 MEQ PO TBCR
40.0000 meq | EXTENDED_RELEASE_TABLET | Freq: Three times a day (TID) | ORAL | Status: DC
  Administered 2015-10-18 – 2015-10-21 (×10): 40 meq via ORAL
  Filled 2015-10-18 (×10): qty 2

## 2015-10-18 MED ORDER — LISINOPRIL 10 MG PO TABS
10.0000 mg | ORAL_TABLET | Freq: Every day | ORAL | Status: DC
  Administered 2015-10-18 – 2015-10-20 (×3): 10 mg via ORAL
  Filled 2015-10-18 (×3): qty 1

## 2015-10-18 MED ORDER — SODIUM CHLORIDE 0.9 % IV SOLN
INTRAVENOUS | Status: DC
  Administered 2015-10-18 – 2015-10-20 (×5): via INTRAVENOUS

## 2015-10-18 MED ORDER — ALBUTEROL SULFATE (2.5 MG/3ML) 0.083% IN NEBU: 2.5000 mg | INHALATION_SOLUTION | Freq: Four times a day (QID) | RESPIRATORY_TRACT | Status: DC | PRN

## 2015-10-18 MED ORDER — MAGNESIUM OXIDE 400 (241.3 MG) MG PO TABS
400.0000 mg | ORAL_TABLET | Freq: Three times a day (TID) | ORAL | Status: AC
  Administered 2015-10-18 – 2015-10-20 (×6): 400 mg via ORAL
  Filled 2015-10-18 (×6): qty 1

## 2015-10-18 MED ORDER — FUROSEMIDE 10 MG/ML IJ SOLN
40.0000 mg | Freq: Two times a day (BID) | INTRAMUSCULAR | Status: DC
  Administered 2015-10-18 – 2015-10-21 (×7): 40 mg via INTRAVENOUS
  Filled 2015-10-18 (×6): qty 4

## 2015-10-18 NOTE — Care Management Important Message (Signed)
Important Message  Patient Details  Name: [name] [name] MRN: [id number] Date of Birth: 1928/07/29   Medicare Important Message Given:  Yes    Sherald Barge, RN 10/18/2015, 10:36 AM

## 2015-10-18 NOTE — Progress Notes (Addendum)
Subjective:  Breathing improved, no chest pain  Objective:  Vital Signs in the last 24 hours: Temp:  [96.6 F (35.9 C)-97.5 F (36.4 C)] 97 F (36.1 C) (07/26 0400) Pulse Rate:  [63-92] 69 (07/26 0600) Resp:  [15-30] 15 (07/26 0600) BP: (116-158)/(63-95) 132/72 (07/26 0600) SpO2:  [92 %-100 %] 97 % (07/26 0600) Weight:  [182 lb 5.1 oz (82.7 kg)-190 lb (86.2 kg)] 182 lb 5.1 oz (82.7 kg) (07/26 0500)  Intake/Output from previous day: 07/25 0701 - 07/26 0700 In: 598 [P.O.:598] Out: 5725 [Urine:5725] Intake/Output from this shift: No intake/output data recorded.  Physical Exam: NECK: slight increase JVD, HJR, nobruit LUNGS: Decreased breath sounds with crackles at bases HEART: Regular rate and rhythm, no murmur, gallop, rub, bruit, thrill, or heave EXTREMITIES:plus 1 edema   Lab Results:  Recent Labs  10/17/15 1236 10/18/15 0431  WBC 7.9 8.2  HGB 10.7* 10.2*  PLT 360 305    Recent Labs  10/17/15 1949 10/18/15 0431  NA 119* 120*  K 3.2* 2.9*  CL 81* 81*  CO2 30 32  GLUCOSE 122* 94  BUN 13 12  CREATININE 0.81 0.78    Recent Labs  10/17/15 2245 10/18/15 0431  TROPONINI 0.08* 0.07*   Hepatic Function Panel  Recent Labs  10/17/15 1236  PROT 7.0  ALBUMIN 3.6  AST 15  ALT 15*  ALKPHOS 74  BILITOT 0.7   No results for input(s): CHOL in the last 72 hours. No results for input(s): PROTIME in the last 72 hours.    Cardiac Studies:  Assessment/Plan:  1. Acute on chronic diastolic heart failure: I/O's -5127 past 24 hrs. potassium 2.9 this morning needs replaced. Continue to diurese. On 80 mg IV BID only taking 40 mg po at home Crt stable. May need to decrease IV lasix today.   2. Essential HTN: Presently normal after diuresis. No changes to medication regimen.   3. CAD with h/o NSTEMI and recent 3-vessel CABG: Symptomatically stable. Insignificant elevation of troponins at this time consistent with demand ischemia.  Continue ASA, Lipitor, Plavix,  lisinopril, and Coreg. D/C NTG paste   4. Post-op atrial fibrillation: Would not anticoagulate at this time. Currently in sinus rhythm. Became bradycardic with amiodarone. Continue Coreg.     LOS: 1 day    Ermalinda Barrios 10/18/2015, 8:03 AM   The patient was seen and examined, and I agree with the physical exam, assessment and plan as documented above by Gerrianne Scale PA-C, with modifications as noted below. Feeling better, less short of breath. Hypertensive today-->will increase lisinopril to 10 mg daily. Will reduce Lasix to 40 mg IV bid. Discussed with nursing.  Kate Sable, MD, [facility]  10/18/2015 9:42 AM

## 2015-10-18 NOTE — Care Management Note (Signed)
Case Management Note  Patient Details  Name: [name] [name] MRN: [id number] Date of Birth: 18-Nov-1928  Subjective/Objective:                  Pt is from home, lives with his wife and is ind with ADL's. Pt active with [facility] for nursing services. Pt plans to return home with resumption of services at DC. Romualdo Bolk, of [facility], made aware of admission and will obtain pt info and be updated on DC plan. Pt has walker and BSC prior to admission.   Action/Plan: Will cont to follow.   Expected Discharge Date:      10/21/2015            Expected Discharge Plan:  [place]  In-House Referral:  NA  Discharge planning Services  CM Consult  Post Acute Care Choice:  Resumption of Svcs/PTA Provider, Home Health Choice offered to:  Patient  DME Arranged:    DME Agency:     HH Arranged:  RN [place] Agency:  [place]  Status of Service:  In process, will continue to follow  If discussed at Long Length of Stay Meetings, dates discussed:    Additional Comments:  Sherald Barge, RN 10/18/2015, 1:52 PM

## 2015-10-18 NOTE — Progress Notes (Signed)
Patient with ejection fraction of 5055% status post CABG with vein harvesting and ankle edema which may be a localized obstruction surgical induced. He shouldn't significantly hyponatremic 120 hyperkalemic 2.9 now being replenished have switched to normal saline and decreased his diuresis from 80 to  40 twice a day we'll check be met daily [name] [name] [id number] DOB: [birth year] DOA: 10/17/2015 PCP: [name] Curet, MD   Physical Exam: Blood pressure 113/64, pulse 88, temperature 97 F (36.1 C), temperature source Oral, resp. rate 18, height 5' 10"  (1.778 [name]), weight 82.7 kg (182 lb 5.1 oz), SpO2 97 %. Lungs show diminished breath sounds in the bases no rales no wheezes no rhonchi appreciable heart regular rhythm no S3-S4 no heaves thrills rubs extremities 1+ pedal edema   Investigations:  Recent Results (from the past 240 hour(s))  MRSA PCR Screening     Status: None   Collection Time: 10/17/15  4:06 PM  Result Value Ref Range Status   MRSA by PCR NEGATIVE NEGATIVE Final    Comment:        The GeneXpert MRSA Assay (FDA approved for NASAL specimens only), is one component of a comprehensive MRSA colonization surveillance program. It is not intended to diagnose MRSA infection nor to guide or monitor treatment for MRSA infections.      Basic Metabolic Panel:  Recent Labs  10/17/15 1949 10/18/15 0431  NA 119* 120*  K 3.2* 2.9*  CL 81* 81*  CO2 30 32  GLUCOSE 122* 94  BUN 13 12  CREATININE 0.81 0.78  CALCIUM 7.9* 7.7*   Liver Function Tests:  Recent Labs  10/17/15 1236  AST 15  ALT 15*  ALKPHOS 74  BILITOT 0.7  PROT 7.0  ALBUMIN 3.6     CBC:  Recent Labs  10/17/15 1236 10/18/15 0431  WBC 7.9 8.2  NEUTROABS 6.3  --   HGB 10.7* 10.2*  HCT 31.9* 30.6*  MCV 85.5 85.2  PLT 360 305    Dg Chest 2 View  Result Date: 10/17/2015 CLINICAL DATA:  82 year old with 1 week history of cough and progressively worsening shortness of breath. Prior CABG. EXAM:  CHEST  2 VIEW COMPARISON:  09/28/2015, 09/26/2015 and earlier, including CT chest 09/19/2015. FINDINGS: Sternotomy for CABG. Cardiac silhouette mildly to moderately enlarged, unchanged. Diffuse interstitial and airspace pulmonary edema, worse than on the most recent prior examination 09/28/2015. Small bilateral pleural effusions. Stable chronic elevation of the right hemidiaphragm. Visualized bony thorax intact. IMPRESSION: Stable cardiomegaly. Moderate CHF, with moderate interstitial and airspace pulmonary edema and small bilateral pleural effusions. Electronically Signed   By: Evangeline Dakin [name].D.   On: 10/17/2015 12:27     Medications:   Impression:  Principal Problem:   Acute CHF (congestive heart failure) (HCC) Active Problems:   Coronary artery disease involving native coronary artery of native heart without angina pectoris   S/P CABG (coronary artery bypass graft)   Elevated troponin I level   Hyponatremia   Anemia due to other cause   Hypothyroidism     Plan: Change to 0.9 normal saline at 100 mL an hour to replenish his sodium load systemically. Place TED stockings on lower extremities for localized venous obstruction after vein harvesting continue IV diuresis 40 twice a day in face of near normal systolic function. Monitor be met daily increase potassium 40 mEq by mouth 3 times a day 40 mEq already given today. Check serum magnesium level in face of vigorous diuresis  Consultants: Cardiology   Procedures  Antibiotics:            Time spent: 30 minutes   LOS: 1 day   [name] [name]   10/18/2015, 11:45 AM

## 2015-10-18 NOTE — Progress Notes (Signed)
*  PRELIMINARY RESULTS* Echocardiogram 2D Echocardiogram has been performed.  [name] [name] 10/18/2015, 4:27 PM

## 2015-10-19 DIAGNOSIS — I255 Ischemic cardiomyopathy: Secondary | ICD-10-CM

## 2015-10-19 DIAGNOSIS — I5041 Acute combined systolic (congestive) and diastolic (congestive) heart failure: Secondary | ICD-10-CM

## 2015-10-19 LAB — BASIC METABOLIC PANEL
Anion gap: 7 (ref 5–15)
BUN: 14 mg/dL (ref 6–20)
CHLORIDE: 86 mmol/L — AB (ref 101–111)
CO2: 34 mmol/L — AB (ref 22–32)
Calcium: 7.9 mg/dL — ABNORMAL LOW (ref 8.9–10.3)
Creatinine, Ser: 0.84 mg/dL (ref 0.61–1.24)
GFR calc Af Amer: >60 mL/min (ref >60)
GFR calc non Af Amer: >60 mL/min (ref >60)
GLUCOSE: 84 mg/dL (ref 65–99)
POTASSIUM: 3.9 mmol/L (ref 3.5–5.1)
Sodium: 127 mmol/L — ABNORMAL LOW (ref 135–145)

## 2015-10-19 LAB — URINE CULTURE

## 2015-10-19 MED ORDER — ENSURE ENLIVE PO LIQD
237.0000 mL | ORAL | Status: DC
  Administered 2015-10-19 – 2015-10-20 (×2): 237 mL via ORAL

## 2015-10-19 NOTE — Progress Notes (Signed)
Initial Nutrition Assessment  DOCUMENTATION CODES:  Non-severe (moderate) malnutrition in context of chronic illness   Pt meets criteria for Moderate MALNUTRITION in the context of Chronic Illness as evidenced by Loss of 5% bw in 1 month and mild muscle wasting.  INTERVENTION:  Ensure Enlive po q24 hrs, each supplement provides 350 kcal and 20 grams of protein  Magic cup q 24hrs, each supplement provides 290 kcal and 9 grams of protein  Food requests  Brief CHF diet ed  NUTRITION DIAGNOSIS:  Inadequate oral intake related to poor appetite as evidenced by loss of 5% bw x 1 month  GOAL:  Patient will meet greater than or equal to 90% of their needs  MONITOR:  PO intake, Supplement acceptance, Labs, Weight trends, I & O's  REASON FOR ASSESSMENT:  Malnutrition Screening Tool    ASSESSMENT:  82 y/o male PMHx CAD s/p CABG x3, MI, Laryngeal cancer s/p partial laryngectomy (can only whisper), prostate cancer treated with radiation, hypothyroidism, HTN. Presented with SOB and lower extremity swelling. On eval, found to have BNP >1800 and xray showed moderate CHF w/ pulmonary edema and bilateral pulmonary effusions. Admitted for management of acute CHF.   Pt is difficult to understand due to only being able to whisper. Family members provided most of history. They report that pt has had "since his open heart surgery on the last day of June". This looks to have been a CABG x3. Pt reports eating 2 meals a day now w/ smaller portions. Did not drink any supplements. He did take a MVI as well as some herbal supplements such as garlic.   Pt states his swelling was new. His UBW is 185 lbs. He states his swelling is almost completely gone now.  He was admitted for his CABG on 6/26 at weight of 186 lbs. It appears his swelling had masked a wt loss of ~10 lbs x 23month.  Family reports CHF is new for them. RD went over very brief CHF education and sodium consumption guidelines. Out of the high sodium  foods mentioned, condensed soup is the only one he reported eating with any consistency. Recommended making own soup from scratch if this is one of his favorite foods. His prostate cancer, on the other hand, is not new. He has had this for >3 years and appears to be stable in this regard.    While admitted, he was agreeable to supplements. Will order Ensure and MC/Ice cream.   Mild muscle/fat wasting appreciated  Labs reviewed:BNP 1809, Alb WDL, WBC WDL, hyponatremia/kalemia improving.    Recent Labs Lab 10/17/15 1949 10/18/15 0431 10/18/15 1201 10/19/15 0413  NA 119* 120*  --  127*  K 3.2* 2.9*  --  3.9  CL 81* 81*  --  86*  CO2 30 32  --  34*  BUN 13 12  --  14  CREATININE 0.81 0.78  --  0.84  CALCIUM 7.9* 7.7*  --  7.9*  MG  --   --  1.4*  --   GLUCOSE 122* 94  --  84   Diet Order:  Diet Heart Room service appropriate? Yes; Fluid consistency: Thin  Skin: Healed surgical incisions  Last BM:  7/27  Height:  Ht Readings from Last 1 Encounters:  10/17/15 5\' 10"  (1.778 m)   Weight:  Wt Readings from Last 1 Encounters:  10/19/15 174 lb 6.1 oz (79.1 kg)   Wt Readings from Last 10 Encounters:  10/19/15 174 lb 6.1 oz (79.1 kg)  10/16/15 190 lb (86.2 kg)  09/30/15 182 lb 3.2 oz (82.6 kg)  08/22/11 189 lb (85.7 kg)  07/31/11 190 lb (86.2 kg)   Ideal Body Weight:  75.45 kg  BMI:  Body mass index is 25.02 kg/m.  Estimated Nutritional Needs:  Kcal:  1750-1900 (22-24 kcal/kg bw) Protein:  75-90 g (1-1.2 g/kg ibw) Fluid:  Per MD  EDUCATION NEEDS:  Education needs not appropriate at this time  Burtis Junes RD, LDN, [place] Clinical Nutrition Pager: [phone] 10/19/2015 12:53 PM

## 2015-10-19 NOTE — Progress Notes (Signed)
SUBJECTIVE: Feeling better, less SOB and less leg swelling. Denies chest pain.   ROS: Other than pertinent positives in "Subjective", all others were reviewed and found to be negative.   Intake/Output Summary (Last 24 hours) at 10/19/15 1049 Last data filed at 10/19/15 0800  Gross per 24 hour  Intake             2860 ml  Output             3750 ml  Net             -890 ml    Current Facility-Administered Medications  Medication Dose Route Frequency Provider Last Rate Last Dose  . 0.9 %  sodium chloride infusion   Intravenous Continuous Lucia Gaskins, MD 100 mL/hr at 10/19/15 0800    . acetaminophen (TYLENOL) tablet 650 mg  650 mg Oral Q6H PRN Rexene Alberts, MD       Or  . acetaminophen (TYLENOL) suppository 650 mg  650 mg Rectal Q6H PRN Rexene Alberts, MD      . albuterol (PROVENTIL) (2.5 MG/3ML) 0.083% nebulizer solution 2.5 mg  2.5 mg Nebulization Q6H PRN Lucia Gaskins, MD      . aspirin EC tablet 81 mg  81 mg Oral Daily Rexene Alberts, MD   81 mg at 10/19/15 1012  . atorvastatin (LIPITOR) tablet 80 mg  80 mg Oral q1800 Rexene Alberts, MD   80 mg at 10/18/15 1800  . benzonatate (TESSALON) capsule 100 mg  100 mg Oral TID PRN Rexene Alberts, MD   100 mg at 10/17/15 2122  . carvedilol (COREG) tablet 6.25 mg  6.25 mg Oral BID WC Rexene Alberts, MD   6.25 mg at 10/19/15 0716  . ciprofloxacin (CIPRO) tablet 500 mg  500 mg Oral BID Rexene Alberts, MD   500 mg at 10/19/15 0716  . clopidogrel (PLAVIX) tablet 75 mg  75 mg Oral Daily Rexene Alberts, MD   75 mg at 10/19/15 1012  . diphenhydrAMINE (BENADRYL) 12.5 MG/5ML elixir 12.5 mg  12.5 mg Oral QHS PRN Rexene Alberts, MD      . docusate sodium (COLACE) capsule 100 mg  100 mg Oral BID Rexene Alberts, MD   100 mg at 10/19/15 1011  . feeding supplement (ENSURE ENLIVE) (ENSURE ENLIVE) liquid 237 mL  237 mL Oral BID BM Rexene Alberts, MD   237 mL at 10/19/15 0955  . furosemide (LASIX) injection 40 mg  40 mg Intravenous Q12H Herminio Commons, MD   40 mg at 10/19/15 0957  . heparin injection 5,000 Units  5,000 Units Subcutaneous Q8H Rexene Alberts, MD   5,000 Units at 10/19/15 [phone]  . HYDROcodone-acetaminophen (NORCO/VICODIN) 5-325 MG per tablet 1 tablet  1 tablet Oral Q4H PRN Rexene Alberts, MD      . levothyroxine (SYNTHROID, LEVOTHROID) tablet 50 mcg  50 mcg Oral Q breakfast Rexene Alberts, MD   50 mcg at 10/19/15 0717  . lisinopril (PRINIVIL,ZESTRIL) tablet 10 mg  10 mg Oral Daily Herminio Commons, MD   10 mg at 10/19/15 1014  . magnesium oxide (MAG-OX) tablet 400 mg  400 mg Oral TID AC Lucia Gaskins, MD   400 mg at 10/19/15 0715  . morphine 2 MG/ML injection 2 mg  2 mg Intravenous Q2H PRN Rexene Alberts, MD      . multivitamin-lutein (OCUVITE-LUTEIN) capsule 2 capsule  2 capsule Oral Daily Rexene Alberts, MD   2 capsule at 10/19/15 1010  . ondansetron (  ZOFRAN) tablet 4 mg  4 mg Oral Q6H PRN Rexene Alberts, MD       Or  . ondansetron [facility]) injection 4 mg  4 mg Intravenous Q6H PRN Rexene Alberts, MD   4 mg at 10/18/15 1447  . pantoprazole (PROTONIX) EC tablet 40 mg  40 mg Oral Daily Rexene Alberts, MD   40 mg at 10/19/15 1013  . potassium chloride SA (K-DUR,KLOR-CON) CR tablet 40 mEq  40 mEq Oral TID Imogene Burn, PA-C   40 mEq at 10/19/15 1007  . sodium chloride flush (NS) 0.9 % injection 3 mL  3 mL Intravenous Q12H Rexene Alberts, MD   3 mL at 10/19/15 0958    Vitals:   10/19/15 0700 10/19/15 0727 10/19/15 0800 10/19/15 0955  BP: 126/70   (!) 101/57  Pulse: 70 76    Resp:  17    Temp:   98.3 F (36.8 C)   TempSrc:   Oral   SpO2:  100%    Weight:      Height:        PHYSICAL EXAM General: NAD HEENT: Normal. Neck: No JVD, no thyromegaly.  Lungs: Bibasilar rales with wheezes. CV: Nondisplaced PMI.  Regular rate and rhythm, normal S1/S2, no S3/S4, no murmur.  No pretibial edema.    Abdomen: Soft, nontender, no distention.  Neurologic: Alert and oriented.     LABS: Basic Metabolic Panel:  Recent  Labs  10/18/15 0431 10/18/15 1201 10/19/15 0413  NA 120*  --  127*  K 2.9*  --  3.9  CL 81*  --  86*  CO2 32  --  34*  GLUCOSE 94  --  84  BUN 12  --  14  CREATININE 0.78  --  0.84  CALCIUM 7.7*  --  7.9*  MG  --  1.4*  --    Liver Function Tests:  Recent Labs  10/17/15 1236  AST 15  ALT 15*  ALKPHOS 74  BILITOT 0.7  PROT 7.0  ALBUMIN 3.6   No results for input(s): LIPASE, AMYLASE in the last 72 hours. CBC:  Recent Labs  10/17/15 1236 10/18/15 0431  WBC 7.9 8.2  NEUTROABS 6.3  --   HGB 10.7* 10.2*  HCT 31.9* 30.6*  MCV 85.5 85.2  PLT 360 305   Cardiac Enzymes:  Recent Labs  10/17/15 1707 10/17/15 2245 10/18/15 0431  TROPONINI 0.08* 0.08* 0.07*   BNP: Invalid input(s): POCBNP D-Dimer: No results for input(s): DDIMER in the last 72 hours. Hemoglobin A1C: No results for input(s): HGBA1C in the last 72 hours. Fasting Lipid Panel: No results for input(s): CHOL, HDL, LDLCALC, TRIG, CHOLHDL, LDLDIRECT in the last 72 hours. Thyroid Function Tests:  Recent Labs  10/17/15 1236  TSH 2.282   Anemia Panel: No results for input(s): VITAMINB12, FOLATE, FERRITIN, TIBC, IRON, RETICCTPCT in the last 72 hours.  RADIOLOGY: Dg Chest 2 View  Result Date: 10/17/2015 CLINICAL DATA:  82 year old with 1 week history of cough and progressively worsening shortness of breath. Prior CABG. EXAM: CHEST  2 VIEW COMPARISON:  09/28/2015, 09/26/2015 and earlier, including CT chest 09/19/2015. FINDINGS: Sternotomy for CABG. Cardiac silhouette mildly to moderately enlarged, unchanged. Diffuse interstitial and airspace pulmonary edema, worse than on the most recent prior examination 09/28/2015. Small bilateral pleural effusions. Stable chronic elevation of the right hemidiaphragm. Visualized bony thorax intact. IMPRESSION: Stable cardiomegaly. Moderate CHF, with moderate interstitial and airspace pulmonary edema and small bilateral pleural effusions. Electronically Signed   By: Marcello Moores  Lawrence M.D.   On: 10/17/2015 12:27  Dg Chest 2 View  Result Date: 09/28/2015 CLINICAL DATA:  82 year old male status post CABG. Initial encounter. EXAM: CHEST  2 VIEW COMPARISON:  09/26/2015 and earlier. FINDINGS: Stable right upper extremity approach PICC line. Epicardial pacer wires remain in place. No other lines or tubes. Sequelae of CABG. Stable right upper chest wall and axillary surgical clips. No pneumothorax or pulmonary edema. Decreased but not fully resolved small bilateral pleural effusions. Stable mild elevation of the right hemidiaphragm from preoperative chest x-rays. Stable cardiac size and mediastinal contours. Mild bibasilar atelectasis. Stable visualized osseous structures. IMPRESSION: 1. Small bilateral pleural effusions are decreased but not completely resolved. Mild bibasilar atelectasis. 2. No new cardiopulmonary abnormality. Electronically Signed   By: Genevie Ann M.D.   On: 09/28/2015 07:30   Dg Chest 2 View  Result Date: 09/26/2015 CLINICAL DATA:  Post CABG.  No chest complaints. EXAM: CHEST  2 VIEW COMPARISON:  09/25/2015. FINDINGS: Swan-Ganz introducer has been removed. Surgical staples RIGHT upper chest wall. Cardiomegaly. Median sternotomy. Improved aeration. Subsegmental atelectasis at the LEFT base is clearing. There are small BILATERAL effusions. There is no pneumothorax. IMPRESSION: Improved aeration. Electronically Signed   By: Staci Righter M.D.   On: 09/26/2015 07:47   Dg Chest Port 1 View  Result Date: 09/25/2015 CLINICAL DATA:  Status post CABG 2 days ago EXAM: PORTABLE CHEST 1 VIEW COMPARISON:  Portable chest x-ray of September 24, 2015 FINDINGS: The lungs remain hypoinflated. The pulmonary interstitial markings have become less conspicuous. There is persistent left lower lobe atelectasis. There are small bilateral pleural effusions. No pneumothorax is observed. Subtle increased interstitial density persists in the right upper lobe but is not greatly changed. The cardiac  silhouette remains enlarged. The pulmonary vascularity is less engorged. The right internal jugular Cordis sheath is stable with the tip over the proximal SVC. The 2 left chest tubes have been removed. The mediastinal drain has been removed. IMPRESSION: Ongoing improvement in the appearance of the chest with decreased pulmonary interstitial edema. No pneumothorax nor large pleural effusion. Small bilateral effusions are present. Decreasing left lower lobe atelectasis. Stable subtle interstitial density in the right mid/upper lung. Electronically Signed   By: David  Martinique M.D.   On: 09/25/2015 07:16   Dg Chest Port 1 View  Result Date: 09/24/2015 CLINICAL DATA:  Myocardial infarct.  Postop from CABG. EXAM: PORTABLE CHEST 1 VIEW COMPARISON:  09/23/2015 FINDINGS: Swan-Ganz catheter has been removed since previous study. Left chest tube remains in place. No pneumothorax identified. Low lung volumes and bibasilar atelectasis again demonstrated. Mild patchy airspace disease in right upper lobe appears new and could be due to pneumonia or asymmetric edema. IMPRESSION: Persistent low lung volumes and bibasilar atelectasis. No pneumothorax identified. Increased mild right upper lobe airspace opacity, which could be due to early pneumonia or asymmetric edema. Electronically Signed   By: Earle Gell M.D.   On: 09/24/2015 08:28   Dg Chest Port 1 View  Result Date: 09/23/2015 CLINICAL DATA:  Status post CABG EXAM: PORTABLE CHEST 1 VIEW COMPARISON:  Chest radiograph from one day prior. FINDINGS: Right internal jugular Swan-Ganz catheter terminates in the proximal right pulmonary artery. Skin staples and clips overlie the right axilla. Left chest tube is stable in position with the tip in the left mid pleural space. Intra-aortic balloon pump marker overlies the T5 level proximal descending thoracic aorta. Mediastinal drain is in place. Sternotomy wires appear aligned and intact. Stable cardiomediastinal silhouette with  normal heart size. No pneumothorax. No pleural effusion. Low lung volumes moderate bibasilar atelectasis, slightly decreased. No overt pulmonary edema. IMPRESSION: 1. No pneumothorax.  Support structures as described. 2. Low lung volumes. Moderate bibasilar atelectasis, slightly decreased. 3. No overt pulmonary edema. Electronically Signed   By: Ilona Sorrel M.D.   On: 09/23/2015 07:12   Dg Chest Port 1 View  Result Date: 09/22/2015 CLINICAL DATA:  Status post CABG EXAM: PORTABLE CHEST 1 VIEW COMPARISON:  September 21, 2015 FINDINGS: The NG tube terminates below today's film. The PA catheter, ETT, and left chest tube are in good position. No pneumothorax. The intra-aortic balloon pump has been pulled back in the interval with the distal tip 8.7 cm below the top of the aortic arch. No pneumothorax. Increased bilateral pulmonary opacities, likely edema. Probable left effusion and atelectasis as well. IMPRESSION: 1. The intra-aortic balloon pump has been pulled back now terminating nearly 9 cm below the top of the aortic arch. Recommend advancement. 2. Other support apparatus is in good position. 3. Increasing pulmonary edema. Findings called to the patient's charge nurse, Altha Harm. Electronically Signed   By: Dorise Bullion III M.D   On: 09/22/2015 15:40   Dg Chest Port 1 View  Result Date: 09/21/2015 CLINICAL DATA:  eval post IABP placement today EXAM: PORTABLE CHEST 1 VIEW COMPARISON:  09/21/2015 FINDINGS: There patchy densities throughout the lungs bilaterally, more confluent within the right upper lobe. Elevated right hemidiaphragm appears stable. Heart size is upper normal in accentuated by the technique. IABP seen just below the level of the aortic arch. IMPRESSION: Interval placement of IABP, in the expected location. Persistent patchy infiltrates bilaterally. Electronically Signed   By: Nolon Nations M.D.   On: 09/21/2015 15:38   Dg Chest Port 1 View  Result Date: 09/21/2015 CLINICAL DATA:   Shortness of breath on exertion EXAM: PORTABLE CHEST 1 VIEW COMPARISON:  09/19/2015 FINDINGS: Cardiac shadow is within normal limits. Patchy new airspace opacities are noted bilaterally. No sizable effusion is noted. No bony abnormality is noted. IMPRESSION: New bilateral patchy infiltrates. Electronically Signed   By: Inez Catalina M.D.   On: 09/21/2015 07:30      ASSESSMENT AND PLAN: 1. Acute on chronic systolic and diastolic heart failure: EF 40-45% with grade II diastolic dysfunction. TEE showed EF 45-50% as per notes dated 09/18/15. Continue IV Lasix 40 mg bid. Na improving, 127 today.  2. Essential HTN: Improved after increase in lisinopril to 10 mg. Low normal at times.  3. CAD with h/o NSTEMI and recent 3-vessel CABG: Symptomatically stable. Insignificant elevation of troponins at this time consistent with demand ischemia.  Continue ASA, Lipitor, Plavix, lisinopril, and Coreg.  4. Post-op atrial fibrillation: Would not anticoagulate at this time. Currently in sinus rhythm. Became bradycardic with amiodarone. Continue Coreg.   Kate Sable, M.D., F.A.C.C.

## 2015-10-19 NOTE — Progress Notes (Signed)
The patient was found to have the hypo-magnesium U yesterday with level I.4 potassium was low of 2.9 and oral potassium increased to 40 mEq by mouth 3 times a day awaiting this morning's be met magnesium oxide 400 mg before meals 3 times a day by mouth added to regimen yesterday. 2-D echo reveals global ejection fraction of 40-45% global hypokinesis with moderate aortic insufficiency noted [name] [name] [id number] DOB: Feb 26, 1929 DOA: 10/17/2015 PCP: Maricela Curet, MD   Physical Exam: Blood pressure 122/67, pulse 69, temperature 98.1 F (36.7 C), temperature source Oral, resp. rate 16, height _0  (1.778 m), weight 79.1 kg (174 lb 6.1 oz), SpO2 100 %. No JVD no carotid bruits no thyromegaly lungs show prolonged inspiratory  Phase scattered rhonchi diminished breath sounds in the bases no rales no wheezes audible heart regular rhythm 1/6 diastolic murmur noted no S3 no S4 auscultated no heaves thrills rubs abdomen soft nontender bowel sounds normoactive   Investigations:  Recent Results (from the past 240 hour(s))  MRSA PCR Screening     Status: None   Collection Time: 10/17/15  4:06 PM  Result Value Ref Range Status   MRSA by PCR NEGATIVE NEGATIVE Final    Comment:        The GeneXpert MRSA Assay (FDA approved for NASAL specimens only), is one component of a comprehensive MRSA colonization surveillance program. It is not intended to diagnose MRSA infection nor to guide or monitor treatment for MRSA infections.      Basic Metabolic Panel:  Recent Labs  10/17/15 1949 10/18/15 0431 10/18/15 1201  NA 119* 120*  --   K 3.2* 2.9*  --   CL 81* 81*  --   CO2 30 32  --   GLUCOSE 122* 94  --   BUN 13 12  --   CREATININE 0.81 0.78  --   CALCIUM 7.9* 7.7*  --   MG  --   --  1.4*   Liver Function Tests:  Recent Labs  10/17/15 1236  AST 15  ALT 15*  ALKPHOS 74  BILITOT 0.7  PROT 7.0  ALBUMIN 3.6     CBC:  Recent Labs  10/17/15 1236 10/18/15 0431  WBC  7.9 8.2  NEUTROABS 6.3  --   HGB 10.7* 10.2*  HCT 31.9* 30.6*  MCV 85.5 85.2  PLT 360 305    Dg Chest 2 View  Result Date: 10/17/2015 CLINICAL DATA:  82 year old with 1 week history of cough and progressively worsening shortness of breath. Prior CABG. EXAM: CHEST  2 VIEW COMPARISON:  09/28/2015, 09/26/2015 and earlier, including CT chest 09/19/2015. FINDINGS: Sternotomy for CABG. Cardiac silhouette mildly to moderately enlarged, unchanged. Diffuse interstitial and airspace pulmonary edema, worse than on the most recent prior examination 09/28/2015. Small bilateral pleural effusions. Stable chronic elevation of the right hemidiaphragm. Visualized bony thorax intact. IMPRESSION: Stable cardiomegaly. Moderate CHF, with moderate interstitial and airspace pulmonary edema and small bilateral pleural effusions. Electronically Signed   By: Evangeline Dakin M.D.   On: 10/17/2015 12:27     Medications:  Impression: Status post CABG with moderate systolic dysfunction EF 6979%. Moderate aortic insufficiency. Hypo-kalemia. Hypomagnesemia Principal Problem:   Acute CHF (congestive heart failure) (HCC) Active Problems:   Coronary artery disease involving native coronary artery of native heart without angina pectoris   S/P CABG (coronary artery bypass graft)   Elevated troponin I level   Hyponatremia   Anemia due to other cause   Hypothyroidism  Plan: Continue diuresis. Awaiting this morning's be met. Continue magnesium oxide 4 mg before meals 3 times a day continue KCl 40 mEq by mouth 3 times a day continue 0.9 normal saline at 100 mL per hour to rectify hyponatremia while concomitantly diureses. Albuterol nebulizer ordered every 6 hours when necessary  Consultants: Cardiology   Procedures   Antibiotics:            Time spent: 30 minutes   LOS: 2 days   Latrail Pounders M   10/19/2015, 6:29 AM

## 2015-10-20 ENCOUNTER — Inpatient Hospital Stay (HOSPITAL_COMMUNITY): Payer: Medicare HMO

## 2015-10-20 ENCOUNTER — Other Ambulatory Visit: Payer: Self-pay | Admitting: Cardiothoracic Surgery

## 2015-10-20 DIAGNOSIS — I480 Paroxysmal atrial fibrillation: Secondary | ICD-10-CM

## 2015-10-20 DIAGNOSIS — Z951 Presence of aortocoronary bypass graft: Secondary | ICD-10-CM

## 2015-10-20 DIAGNOSIS — I2510 Atherosclerotic heart disease of native coronary artery without angina pectoris: Secondary | ICD-10-CM

## 2015-10-20 DIAGNOSIS — E440 Moderate protein-calorie malnutrition: Secondary | ICD-10-CM | POA: Insufficient documentation

## 2015-10-20 DIAGNOSIS — I248 Other forms of acute ischemic heart disease: Secondary | ICD-10-CM

## 2015-10-20 LAB — BASIC METABOLIC PANEL
Anion gap: 6 (ref 5–15)
BUN: 15 mg/dL (ref 6–20)
CHLORIDE: 90 mmol/L — AB (ref 101–111)
CO2: 34 mmol/L — ABNORMAL HIGH (ref 22–32)
Calcium: 8.1 mg/dL — ABNORMAL LOW (ref 8.9–10.3)
Creatinine, Ser: 0.84 mg/dL (ref 0.61–1.24)
Glucose, Bld: 100 mg/dL — ABNORMAL HIGH (ref 65–99)
POTASSIUM: 4.5 mmol/L (ref 3.5–5.1)
SODIUM: 130 mmol/L — AB (ref 135–145)

## 2015-10-20 MED ORDER — ZOLPIDEM TARTRATE 5 MG PO TABS: 5.0000 mg | ORAL_TABLET | Freq: Every evening | ORAL | Status: DC | PRN

## 2015-10-20 MED ORDER — LISINOPRIL 10 MG PO TABS
10.0000 mg | ORAL_TABLET | Freq: Once | ORAL | Status: AC
  Administered 2015-10-20: 10 mg via ORAL
  Filled 2015-10-20: qty 1

## 2015-10-20 MED ORDER — LISINOPRIL 10 MG PO TABS
20.0000 mg | ORAL_TABLET | Freq: Every day | ORAL | Status: DC
  Administered 2015-10-21: 20 mg via ORAL
  Filled 2015-10-20: qty 2

## 2015-10-20 NOTE — Care Management Important Message (Signed)
Important Message  Patient Details  Name: FISHEL BIRKEY MRN: [id number] Date of Birth: 15-Feb-1929   Medicare Important Message Given:  Yes    Sherald Barge, RN 10/20/2015, 2:30 PM

## 2015-10-20 NOTE — Progress Notes (Signed)
Called report to 300 nurse and will be transported by wheelchair.

## 2015-10-20 NOTE — Progress Notes (Signed)
Primary Cardiologist: Carlyle Dolly MD  Cardiology Specific Problem List: 1. Acute on Chronic Mixed  CHF 2. Essential Hypertension 3. CAD 4. Post Operative Atrial fibrillation   Subjective:    Complaints of fatigue. Not sleeping well. No complaints of pain or dyspnea.   Objective:   Temp:  [97.1 F (36.2 C)-99.5 F (37.5 C)] 98.1 F (36.7 C) (07/28 0729) Pulse Rate:  [64-89] 82 (07/28 0729) Resp:  [12-28] 19 (07/28 0729) BP: (94-149)/(44-80) 145/70 (07/28 0729) SpO2:  [90 %-100 %] 92 % (07/28 0700) FiO2 (%):  [28 %] 28 % (07/28 0729) Weight:  [174 lb 13.2 oz (79.3 kg)] 174 lb 13.2 oz (79.3 kg) (07/28 0500) Last BM Date: 10/20/15  Filed Weights   10/18/15 0500 10/19/15 0500 10/20/15 0500  Weight: 182 lb 5.1 oz (82.7 kg) 174 lb 6.1 oz (79.1 kg) 174 lb 13.2 oz (79.3 kg)    Intake/Output Summary (Last 24 hours) at 10/20/15 0826 Last data filed at 10/20/15 0701  Gross per 24 hour  Intake          2861.67 ml  Output             4150 ml  Net         -1288.33 ml    Telemetry: Atrial fib rate controlled in the 60's-80's.   Exam:  General: No acute distress.  HEENT: Conjunctiva and lids normal, oropharynx clear.  Lungs: Inspiratory and expiratory crackles, with soft wheezes noted.   Cardiac: Mild JVP with HJR, or bruits. IRRR, no gallop or rub.   Abdomen: Normoactive bowel sounds, nontender, nondistended.  Extremities: No pitting edema, distal pulses full.Bilateral TED hose.   Neuropsychiatric: Alert and oriented x3, affect appropriate.  Echocardiogram 10/18/2015 Left ventricle: The cavity size was normal. Wall thickness was   increased in a pattern of moderate LVH. Systolic function was   mildly to moderately reduced. The estimated ejection fraction was   in the range of 40% to 45%. Diffuse hypokinesis. Features are   consistent with a pseudonormal left ventricular filling pattern,   with concomitant abnormal relaxation and increased filling   pressure  (grade 2 diastolic dysfunction). Doppler parameters are   consistent with high ventricular filling pressure. - Aortic valve: Mildly to moderately calcified annulus. Trileaflet;   mildly thickened leaflets. There was moderate regurgitation. Mean   gradient (S): 4 mm Hg. Valve area (VTI): 1.84 cm^2. Valve area   (Vmax): 1.96 cm^2. Valve area (Vmean): 1.64 cm^2. - Aortic root: The proximal ascending aorta just distal to the   sinotubular junction is mildly dilated at 4.0 cm. - Mitral valve: Mildly to moderately calcified annulus. Mildly   thickened leaflets . There was mild regurgitation. - Left atrium: The atrium was moderately dilated. - Right atrium: The atrium was mildly dilated. - Atrial septum: No defect or patent foramen ovale was identified. - Tricuspid valve: There was mild-moderate regurgitation. - Pulmonary arteries: PASP is at least 43 mmHg. It is at least   moderately elevated. Cannot estimate RA pressure, IVC poorly   visualized. - Pericardium, extracardiac: Large left pleural effusion. - Technically adequate study.  Lab Results:  Basic Metabolic Panel:  Recent Labs Lab 10/18/15 0431 10/18/15 1201 10/19/15 0413 10/20/15 0415  NA 120*  --  127* 130*  K 2.9*  --  3.9 4.5  CL 81*  --  86* 90*  CO2 32  --  34* 34*  GLUCOSE 94  --  84 100*  BUN 12  --  14 15  CREATININE 0.78  --  0.84 0.84  CALCIUM 7.7*  --  7.9* 8.1*  MG  --  1.4*  --   --     Liver Function Tests:  Recent Labs Lab 10/17/15 1236  AST 15  ALT 15*  ALKPHOS 74  BILITOT 0.7  PROT 7.0  ALBUMIN 3.6    CBC:  Recent Labs Lab 10/17/15 1236 10/18/15 0431  WBC 7.9 8.2  HGB 10.7* 10.2*  HCT 31.9* 30.6*  MCV 85.5 85.2  PLT 360 305    Cardiac Enzymes:  Recent Labs Lab 10/17/15 1707 10/17/15 2245 10/18/15 0431  TROPONINI 0.08* 0.08* 0.07*    Medications:   Scheduled Medications: . aspirin EC  81 mg Oral Daily  . atorvastatin  80 mg Oral q1800  . carvedilol  6.25 mg Oral BID WC    . clopidogrel  75 mg Oral Daily  . docusate sodium  100 mg Oral BID  . feeding supplement (ENSURE ENLIVE)  237 mL Oral Q24H  . furosemide  40 mg Intravenous Q12H  . heparin  5,000 Units Subcutaneous Q8H  . levothyroxine  50 mcg Oral Q breakfast  . lisinopril  10 mg Oral Daily  . magnesium oxide  400 mg Oral TID AC  . multivitamin-lutein  2 capsule Oral Daily  . pantoprazole  40 mg Oral Daily  . potassium chloride SA  40 mEq Oral TID  . sodium chloride flush  3 mL Intravenous Q12H    Infusions: . sodium chloride 100 mL/hr at 10/20/15 0701    PRN Medications: acetaminophen **OR** acetaminophen, albuterol, benzonatate, diphenhydrAMINE, HYDROcodone-acetaminophen, morphine injection, ondansetron **OR** ondansetron (ZOFRAN) IV   Assessment and Plan:   1. Acute on Chronic Mixed CHF: He has diuresed 7.2 liters since admission.  Lungs continue to have some crackles and wheezes. Will repeat CXR. Continue to diurese but decreasing. Sodium is improving. Creatinine 0.84, CO2 34. Potassium 4.5. No evidence of LEE, but some mild JVD.   2. CAD: Recent CABG 3 vessel 08/2015. He denies chest pain. Some soreness at sternotomy site. Continue carvedilol, ASA, ACE. BP slightly elevated over 24 hour trend. Consider increasing lisinopril.  3. Hypertension: Slightly elevated. Continue diureses. May need to increase lisinopril.   4. Deconditioning: OOB into chair. Watch BP with this. If remains elevated will consider mediation titration. Consider PT evaluation.   5. Insomnia; On Benadryl for assistance. Will defer to PCP to add something different.     Phill Myron. Lawrence NP [place]  10/20/2015, 8:26 AM   The patient was seen and examined, and I agree with the physical exam, assessment and plan as documented above by K. Lawrence, with modifications as noted below.  1. Acute on chronic systolic and diastolic heart failure: EF 40-45% with grade II diastolic dysfunction. TEE showed EF 45-50% as per notes  dated 09/18/15. Continue IV Lasix 40 mg bid x 24 hours then switch to oral. Na improving, 130 today. Repeat CXR.  2. Essential HTN: Improved after increase in lisinopril to 10 mg. Has been some fluctuation. 110/58 during my exam. No changes.  3. CAD with h/o NSTEMI and recent 3-vessel CABG: Symptomatically stable. Insignificant elevation of troponins at this time consistent with demand ischemia.  Continue ASA, Lipitor, Plavix, lisinopril, and Coreg.  4. Post-op atrial fibrillation: Would not anticoagulate at this time. Currently in sinus rhythm. Became bradycardic with amiodarone. Continue Coreg.    Kate Sable, MD, [facility]  10/20/2015 10:20 AM

## 2015-10-20 NOTE — Progress Notes (Signed)
Respiratory status much improved patient has had some hypertension throughout the day we will increase lisinopril from 10-20 sodium is corrected to 1:30 will check hypo-magnesium in a.[name]. along with be met transferred to telemetry [name] [name] [name] [name] DOB: [birth year] DOA: 10/17/2015 PCP: [name] Curet, MD   Physical Exam: Blood pressure 102/65, pulse 62, temperature 97.3 F (36.3 C), temperature source Oral, resp. rate 19, height 5' 10"  (1.778 [name]), weight 79.3 kg (174 lb 13.2 oz), SpO2 98 %. Lungs decreased breath sounds in the bases no rales wheeze rhonchi appreciable prolonged respiratory phase mild scattered rhonchi scattered throughout lung fields heart regular rhythm with 6 diastolic murmur at the aortic focus no heaves thrills rubs abdomen soft nontender bowel sounds normoactive   Investigations:  Recent Results (from the past 240 hour(s))  MRSA PCR Screening     Status: None   Collection Time: 10/17/15  4:06 PM  Result Value Ref Range Status   MRSA by PCR NEGATIVE NEGATIVE Final    Comment:        The GeneXpert MRSA Assay (FDA approved for NASAL specimens only), is one component of a comprehensive MRSA colonization surveillance program. It is not intended to diagnose MRSA infection nor to guide or monitor treatment for MRSA infections.   Culture, Urine     Status: Abnormal   Collection Time: 10/17/15  9:50 PM  Result Value Ref Range Status   Specimen Description URINE, CLEAN CATCH  Final   Special Requests NONE  Final   Culture (A)  Final    <10,000 COLONIES/mL INSIGNIFICANT GROWTH Performed at [facility]    Report Status 10/19/2015 FINAL  Final     Basic Metabolic Panel:  Recent Labs  10/18/15 1201 10/19/15 0413 10/20/15 0415  NA  --  127* 130*  K  --  3.9 4.5  CL  --  86* 90*  CO2  --  34* 34*  GLUCOSE  --  84 100*  BUN  --  14 15  CREATININE  --  0.84 0.84  CALCIUM  --  7.9* 8.1*  MG 1.4*  --   --    Liver Function Tests:  Recent  Labs  10/17/15 1236  AST 15  ALT 15*  ALKPHOS 74  BILITOT 0.7  PROT 7.0  ALBUMIN 3.6     CBC:  Recent Labs  10/17/15 1236 10/18/15 0431  WBC 7.9 8.2  NEUTROABS 6.3  --   HGB 10.7* 10.2*  HCT 31.9* 30.6*  MCV 85.5 85.2  PLT 360 305    No results found.    Medication  Impression:  Principal Problem:   Acute CHF (congestive heart failure) (HCC) Active Problems:   Coronary artery disease involving native coronary artery of native heart without angina pectoris   S/P CABG (coronary artery bypass graft)   Elevated troponin I level   Hyponatremia   Anemia due to other cause   Hypothyroidism   Malnutrition of moderate degree     Plan: Increase lisinopril from 10-20 monitor be met and magnesium in a.[name]. transferred to telemetry  Consultants: Cardiology   Procedures   Antibiotics:            Time spent: 30 minutes   LOS: 3 days   [name] [name]   10/20/2015, 12:25 PM

## 2015-10-21 LAB — BASIC METABOLIC PANEL
Anion gap: 5 (ref 5–15)
BUN: 14 mg/dL (ref 6–20)
CHLORIDE: 90 mmol/L — AB (ref 101–111)
CO2: 35 mmol/L — AB (ref 22–32)
CREATININE: 0.82 mg/dL (ref 0.61–1.24)
Calcium: 8.1 mg/dL — ABNORMAL LOW (ref 8.9–10.3)
GFR calc Af Amer: >60 mL/min (ref >60)
GFR calc non Af Amer: >60 mL/min (ref >60)
Glucose, Bld: 109 mg/dL — ABNORMAL HIGH (ref 65–99)
Potassium: 4.5 mmol/L (ref 3.5–5.1)
SODIUM: 130 mmol/L — AB (ref 135–145)

## 2015-10-21 LAB — MAGNESIUM: MAGNESIUM: 1.7 mg/dL (ref 1.7–2.4)

## 2015-10-21 MED ORDER — POTASSIUM CHLORIDE CRYS ER 20 MEQ PO TBCR: 40.0000 meq | EXTENDED_RELEASE_TABLET | Freq: Two times a day (BID) | ORAL | 2 refills | Status: DC

## 2015-10-21 MED ORDER — LISINOPRIL 20 MG PO TABS: 20.0000 mg | ORAL_TABLET | Freq: Every day | ORAL | 3 refills | Status: AC

## 2015-10-21 NOTE — Progress Notes (Signed)
Patient states understanding of discharge instructions.  

## 2015-10-21 NOTE — Discharge Summary (Signed)
Physician Discharge Summary  [name] [name] [id number] DOB: 30-Jul-1928 DOA: 10/17/2015  PCP: Maricela Curet, MD  Admit date: 10/17/2015 Discharge date: 10/21/2015   Recommendations for Outpatient Follow-up:  Patient is advised to take Lasix 40 mg by mouth daily to take potassium chloride 40 mEq by mouth twice a day and to increase his lisinopril from 10-20 mg by mouth daily as an outpatient likewise he is scheduled to follow my office in 3-4 days time to assess hemodynamics congestive heart failure dyspnea and potassium and magnesium levels Discharge Diagnoses:  Principal Problem:   Acute CHF (congestive heart failure) ([place]) Active Problems:   Coronary artery disease involving native coronary artery of native heart without angina pectoris   S/P CABG (coronary artery bypass graft)   Elevated troponin I level   Hyponatremia   Anemia due to other cause   Hypothyroidism   Malnutrition of moderate degree   Discharge Condition: Good  Filed Weights   10/19/15 0500 10/20/15 0500 10/21/15 0543  Weight: 79.1 kg (174 lb 6.1 oz) 79.3 kg (174 lb 13.2 oz) 78.7 kg (173 lb 8 oz)    History of present illness:  Patient is an 82 year old white male status post CABG 3 6 weeks ago. Admitted to the hospital with CHF exacerbation he has underlying COPD recent echo on this admission reveals decrease in systolic function to [id number] with global hypokinesis with concomitant moderate aortic insufficiency he had some hypotension in hospital his low lisinopril was increased from 10-20 mg used in consultation by cardiology and was on admission he was severely hyponatremic presumably due to diuresis was placed on 0.9 normal saline during this admission and his sodium did correct he likewise was hypokalemic and hypomagnesemic and these were repleted orally and intravenously with normal values upon discharge his lisinopril was increased from 10-20 mg per day his potassium as an outpatient was 40 mg by  mouth twice a day and he will follow-up in my office in 3-4 days time to assess hemodynamics as well as electrolytes and renal function  Hospital Course:  See history of present illness above  Procedures:    Consultations:  Cardiology  Discharge Instructions  Discharge Instructions    Discharge instructions    Complete by:  As directed   Discharge patient    Complete by:  As directed       Medication List    STOP taking these medications   potassium chloride 10 MEQ tablet Commonly known as:  K-DUR Replaced by:  potassium chloride SA 20 MEQ tablet   traMADol 50 MG tablet Commonly known as:  ULTRAM     TAKE these medications   aspirin 81 MG EC tablet Take 1 tablet (81 mg total) by mouth daily.   atorvastatin 80 MG tablet Commonly known as:  LIPITOR Take 1 tablet (80 mg total) by mouth daily at 6 PM.   BILBERRY PO Take 2 tablets by mouth daily.   carvedilol 6.25 MG tablet Commonly known as:  COREG Take 1 tablet (6.25 mg total) by mouth 2 (two) times daily with a meal.   ciprofloxacin 500 MG tablet Commonly known as:  CIPRO Take 500 mg by mouth 2 (two) times daily.   clopidogrel 75 MG tablet Commonly known as:  PLAVIX Take 1 tablet (75 mg total) by mouth daily.   furosemide 40 MG tablet Commonly known as:  LASIX Take 1 tablet (40 mg total) by mouth daily.   GARLIC PO Take 1 tablet by mouth daily.  levothyroxine 50 MCG tablet Commonly known as:  SYNTHROID, LEVOTHROID Take 50 mcg by mouth every morning.   lisinopril 20 MG tablet Commonly known as:  PRINIVIL,ZESTRIL Take 1 tablet (20 mg total) by mouth daily. What changed:  medication strength  how much to take   omeprazole 20 MG capsule Commonly known as:  PRILOSEC Take 20 mg by mouth every morning.   potassium chloride SA 20 MEQ tablet Commonly known as:  K-DUR,KLOR-CON Take 2 tablets (40 mEq total) by mouth 2 (two) times daily. Replaces:  potassium chloride 10 MEQ tablet   PRESERVISION  AREDS 2 Caps Take 2 capsules by mouth daily.      Allergies  Allergen Reactions  . Sulfa Antibiotics Palpitations  . Contrast Media [Iodinated Diagnostic Agents] Rash    ivp dye---  Pt states w/ pre-treatment does okay      The results of significant diagnostics from this hospitalization (including imaging, microbiology, ancillary and laboratory) are listed below for reference.    Significant Diagnostic Studies: Dg Chest 2 View  Result Date: 10/20/2015 CLINICAL DATA:  Followup acute CHF.  Prior CABG. EXAM: CHEST  2 VIEW COMPARISON:  10/17/2015, 09/28/2015 and earlier, including CT chest 09/19/2015. FINDINGS: Sternotomy for CABG. Cardiac silhouette moderately enlarged. Interval improvement in the interstitial pulmonary edema since the examination 3 days ago. Minimal interstitial edema persists when compared to a prior examination on 09/17/2015 were the interstitium appeared normal. Small bilateral pleural effusions, unchanged, with mild passive atelectasis in the lower lobes. No new abnormalities. Visualized bony thorax intact with slight thoracic dextroscoliosis. IMPRESSION: Improved CHF, with only minimal interstitial pulmonary edema persisting. Stable bilateral pleural effusions and associated mild passive atelectasis in the lower lobes. No new abnormalities. Electronically Signed   By: Evangeline Dakin M.D.   On: 10/20/2015 15:20  Dg Chest 2 View  Result Date: 10/17/2015 CLINICAL DATA:  82 year old with 1 week history of cough and progressively worsening shortness of breath. Prior CABG. EXAM: CHEST  2 VIEW COMPARISON:  09/28/2015, 09/26/2015 and earlier, including CT chest 09/19/2015. FINDINGS: Sternotomy for CABG. Cardiac silhouette mildly to moderately enlarged, unchanged. Diffuse interstitial and airspace pulmonary edema, worse than on the most recent prior examination 09/28/2015. Small bilateral pleural effusions. Stable chronic elevation of the right hemidiaphragm. Visualized bony  thorax intact. IMPRESSION: Stable cardiomegaly. Moderate CHF, with moderate interstitial and airspace pulmonary edema and small bilateral pleural effusions. Electronically Signed   By: Evangeline Dakin M.D.   On: 10/17/2015 12:27  Dg Chest 2 View  Result Date: 09/28/2015 CLINICAL DATA:  82 year old male status post CABG. Initial encounter. EXAM: CHEST  2 VIEW COMPARISON:  09/26/2015 and earlier. FINDINGS: Stable right upper extremity approach PICC line. Epicardial pacer wires remain in place. No other lines or tubes. Sequelae of CABG. Stable right upper chest wall and axillary surgical clips. No pneumothorax or pulmonary edema. Decreased but not fully resolved small bilateral pleural effusions. Stable mild elevation of the right hemidiaphragm from preoperative chest x-rays. Stable cardiac size and mediastinal contours. Mild bibasilar atelectasis. Stable visualized osseous structures. IMPRESSION: 1. Small bilateral pleural effusions are decreased but not completely resolved. Mild bibasilar atelectasis. 2. No new cardiopulmonary abnormality. Electronically Signed   By: Genevie Ann M.D.   On: 09/28/2015 07:30   Dg Chest 2 View  Result Date: 09/26/2015 CLINICAL DATA:  Post CABG.  No chest complaints. EXAM: CHEST  2 VIEW COMPARISON:  09/25/2015. FINDINGS: Swan-Ganz introducer has been removed. Surgical staples RIGHT upper chest wall. Cardiomegaly. Median sternotomy. Improved aeration. Subsegmental  atelectasis at the LEFT base is clearing. There are small BILATERAL effusions. There is no pneumothorax. IMPRESSION: Improved aeration. Electronically Signed   By: Staci Righter M.D.   On: 09/26/2015 07:47   Dg Chest Port 1 View  Result Date: 09/25/2015 CLINICAL DATA:  Status post CABG 2 days ago EXAM: PORTABLE CHEST 1 VIEW COMPARISON:  Portable chest x-ray of September 24, 2015 FINDINGS: The lungs remain hypoinflated. The pulmonary interstitial markings have become less conspicuous. There is persistent left lower lobe  atelectasis. There are small bilateral pleural effusions. No pneumothorax is observed. Subtle increased interstitial density persists in the right upper lobe but is not greatly changed. The cardiac silhouette remains enlarged. The pulmonary vascularity is less engorged. The right internal jugular Cordis sheath is stable with the tip over the proximal SVC. The 2 left chest tubes have been removed. The mediastinal drain has been removed. IMPRESSION: Ongoing improvement in the appearance of the chest with decreased pulmonary interstitial edema. No pneumothorax nor large pleural effusion. Small bilateral effusions are present. Decreasing left lower lobe atelectasis. Stable subtle interstitial density in the right mid/upper lung. Electronically Signed   By: David  Martinique M.D.   On: 09/25/2015 07:16   Dg Chest Port 1 View  Result Date: 09/24/2015 CLINICAL DATA:  Myocardial infarct.  Postop from CABG. EXAM: PORTABLE CHEST 1 VIEW COMPARISON:  09/23/2015 FINDINGS: Swan-Ganz catheter has been removed since previous study. Left chest tube remains in place. No pneumothorax identified. Low lung volumes and bibasilar atelectasis again demonstrated. Mild patchy airspace disease in right upper lobe appears new and could be due to pneumonia or asymmetric edema. IMPRESSION: Persistent low lung volumes and bibasilar atelectasis. No pneumothorax identified. Increased mild right upper lobe airspace opacity, which could be due to early pneumonia or asymmetric edema. Electronically Signed   By: Earle Gell M.D.   On: 09/24/2015 08:28   Dg Chest Port 1 View  Result Date: 09/23/2015 CLINICAL DATA:  Status post CABG EXAM: PORTABLE CHEST 1 VIEW COMPARISON:  Chest radiograph from one day prior. FINDINGS: Right internal jugular Swan-Ganz catheter terminates in the proximal right pulmonary artery. Skin staples and clips overlie the right axilla. Left chest tube is stable in position with the tip in the left mid pleural space. Intra-aortic  balloon pump marker overlies the T5 level proximal descending thoracic aorta. Mediastinal drain is in place. Sternotomy wires appear aligned and intact. Stable cardiomediastinal silhouette with normal heart size. No pneumothorax. No pleural effusion. Low lung volumes moderate bibasilar atelectasis, slightly decreased. No overt pulmonary edema. IMPRESSION: 1. No pneumothorax.  Support structures as described. 2. Low lung volumes. Moderate bibasilar atelectasis, slightly decreased. 3. No overt pulmonary edema. Electronically Signed   By: Ilona Sorrel M.D.   On: 09/23/2015 07:12   Dg Chest Port 1 View  Result Date: 09/22/2015 CLINICAL DATA:  Status post CABG EXAM: PORTABLE CHEST 1 VIEW COMPARISON:  September 21, 2015 FINDINGS: The NG tube terminates below today's film. The PA catheter, ETT, and left chest tube are in good position. No pneumothorax. The intra-aortic balloon pump has been pulled back in the interval with the distal tip 8.7 cm below the top of the aortic arch. No pneumothorax. Increased bilateral pulmonary opacities, likely edema. Probable left effusion and atelectasis as well. IMPRESSION: 1. The intra-aortic balloon pump has been pulled back now terminating nearly 9 cm below the top of the aortic arch. Recommend advancement. 2. Other support apparatus is in good position. 3. Increasing pulmonary edema. Findings called to  the patient's charge nurse, Altha Harm. Electronically Signed   By: Dorise Bullion III M.D   On: 09/22/2015 15:40   Dg Chest Port 1 View  Result Date: 09/21/2015 CLINICAL DATA:  eval post IABP placement today EXAM: PORTABLE CHEST 1 VIEW COMPARISON:  09/21/2015 FINDINGS: There patchy densities throughout the lungs bilaterally, more confluent within the right upper lobe. Elevated right hemidiaphragm appears stable. Heart size is upper normal in accentuated by the technique. IABP seen just below the level of the aortic arch. IMPRESSION: Interval placement of IABP, in the expected  location. Persistent patchy infiltrates bilaterally. Electronically Signed   By: Nolon Nations M.D.   On: 09/21/2015 15:38    Microbiology: Recent Results (from the past 240 hour(s))  MRSA PCR Screening     Status: None   Collection Time: 10/17/15  4:06 PM  Result Value Ref Range Status   MRSA by PCR NEGATIVE NEGATIVE Final    Comment:        The GeneXpert MRSA Assay (FDA approved for NASAL specimens only), is one component of a comprehensive MRSA colonization surveillance program. It is not intended to diagnose MRSA infection nor to guide or monitor treatment for MRSA infections.   Culture, Urine     Status: Abnormal   Collection Time: 10/17/15  9:50 PM  Result Value Ref Range Status   Specimen Description URINE, CLEAN CATCH  Final   Special Requests NONE  Final   Culture (A)  Final    <10,000 COLONIES/mL INSIGNIFICANT GROWTH Performed at [facility]    Report Status 10/19/2015 FINAL  Final     Labs: Basic Metabolic Panel:  Recent Labs Lab 10/17/15 1949 10/18/15 0431 10/18/15 1201 10/19/15 0413 10/20/15 0415 10/21/15 0637  NA 119* 120*  --  127* 130* 130*  K 3.2* 2.9*  --  3.9 4.5 4.5  CL 81* 81*  --  86* 90* 90*  CO2 30 32  --  34* 34* 35*  GLUCOSE 122* 94  --  84 100* 109*  BUN 13 12  --  14 15 14   CREATININE 0.81 0.78  --  0.84 0.84 0.82  CALCIUM 7.9* 7.7*  --  7.9* 8.1* 8.1*  MG  --   --  1.4*  --   --  1.7   Liver Function Tests:  Recent Labs Lab 10/17/15 1236  AST 15  ALT 15*  ALKPHOS 74  BILITOT 0.7  PROT 7.0  ALBUMIN 3.6   No results for input(s): LIPASE, AMYLASE in the last 168 hours. No results for input(s): AMMONIA in the last 168 hours. CBC:  Recent Labs Lab 10/17/15 1236 10/18/15 0431  WBC 7.9 8.2  NEUTROABS 6.3  --   HGB 10.7* 10.2*  HCT 31.9* 30.6*  MCV 85.5 85.2  PLT 360 305   Cardiac Enzymes:  Recent Labs Lab 10/17/15 1236 10/17/15 1707 10/17/15 2245 10/18/15 0431  TROPONINI 0.09* 0.08* 0.08* 0.07*    BNP: BNP (last 3 results)  Recent Labs  09/19/15 0312 09/20/15 0245 10/17/15 1236  BNP 356.6* 499.8* 1,809.0*    ProBNP (last 3 results) No results for input(s): PROBNP in the last 8760 hours.  CBG: No results for input(s): GLUCAP in the last 168 hours.     Signed:  Boots Mcglown Jerilynn Mages  [facility] Pager: [phone] 10/21/2015, 8:22 AM

## 2015-10-23 ENCOUNTER — Ambulatory Visit
Admission: RE | Admit: 2015-10-23 | Discharge: 2015-10-23 | Disposition: A | Discharge status: Home / Self Care | Payer: Medicare HMO | Source: Ambulatory Visit | Attending: Cardiothoracic Surgery | Admitting: Cardiothoracic Surgery

## 2015-10-23 ENCOUNTER — Ambulatory Visit (INDEPENDENT_AMBULATORY_CARE_PROVIDER_SITE_OTHER): Payer: Self-pay | Admitting: Physician Assistant

## 2015-10-23 VITALS — BP 114/67 | HR 72 | Resp 20 | Ht 70.0 in | Wt 173.0 lb

## 2015-10-23 DIAGNOSIS — Z951 Presence of aortocoronary bypass graft: Secondary | ICD-10-CM

## 2015-10-23 NOTE — Progress Notes (Signed)
HPI: Patient returns for routine postoperative follow-up having undergone CABG x 3 on 09/22/2015.   The patient's early postoperative recovery while in the hospital was notable for Atrial Fibrillation. Since hospital discharge the patient reports he was initially doing okay.  He states he developed shortness of breath and required admission the hospital for CHF exacerbation with discharge on 10/21/2015.  Currently he states he is tired and doesn't have much energy.  His daughters state during his hospitalization they had him on bed rest and they would not allow him to ambulate.  He is ambulating once a day currently.  I have encouraged him to try and ambulate three times per day.  He continues to have lower extremity edema and is on Lasix daily.  Finally he states could his BP medication be too much as they increased his dose from 5 to 20 mg.   Current Outpatient Prescriptions  Medication Sig Dispense Refill  . aspirin EC 81 MG EC tablet Take 1 tablet (81 mg total) by mouth daily.    Marland Kitchen atorvastatin (LIPITOR) 80 MG tablet Take 1 tablet (80 mg total) by mouth daily at 6 PM. 30 tablet 3  . Bilberry, Vaccinium myrtillus, (BILBERRY PO) Take 2 tablets by mouth daily.     . carvedilol (COREG) 6.25 MG tablet Take 1 tablet (6.25 mg total) by mouth 2 (two) times daily with a meal. (Patient taking differently: Take 3.125 mg by mouth 2 (two) times daily with a meal. ) 60 tablet 3  . clopidogrel (PLAVIX) 75 MG tablet Take 1 tablet (75 mg total) by mouth daily. 30 tablet 0  . furosemide (LASIX) 40 MG tablet Take 1 tablet (40 mg total) by mouth daily. 90 tablet 3  . GARLIC PO Take 1 tablet by mouth daily.    Marland Kitchen levothyroxine (SYNTHROID, LEVOTHROID) 50 MCG tablet Take 50 mcg by mouth every morning.    Marland Kitchen lisinopril (PRINIVIL,ZESTRIL) 20 MG tablet Take 1 tablet (20 mg total) by mouth daily. 30 tablet 3  . Multiple Vitamins-Minerals (PRESERVISION AREDS 2) CAPS Take 2 capsules by mouth daily.     Marland Kitchen omeprazole (PRILOSEC)  20 MG capsule Take 20 mg by mouth every morning.     . potassium chloride SA (K-DUR,KLOR-CON) 20 MEQ tablet Take 2 tablets (40 mEq total) by mouth 2 (two) times daily. 60 tablet 2   No current facility-administered medications for this visit.     Physical Exam:  BP 114/67 (BP Location: Left Arm, Patient Position: Sitting, Cuff Size: Small)   Pulse 72   Resp 20   Ht 5\' 10"  (1.778 m)   Wt 173 lb (78.5 kg)   SpO2 98% Comment: RA  BMI 24.82 kg/m   Gen: no apparent distress Heart: RRR Lungs: CTA bilaterally Abd: soft non-tender, non-distended Ext: mild pitting edema Incisions: healing well  Diagnostic Tests:  CXR;  Pulmonary edema, mildly improved from previous study.. No significant pleural effusion present  A/P:  1. S/p CABG x 4- initially doing well, however since recent hospitalization has been more fatigued and weak.  I explained to patient and daughter this will have made the patient more deconditioned especially with non being allowed to ambulate during that stay.  He was encouraged to continue to ambulate as tolerated and his activity level will slowly improve.  However, his HR is in the low 70s and his BP is in the low 110s.  Due to this I will decrease his Coreg dose to 3.125 mg BID to offset the increase  in his Lisinopril dose to see if the patient gets a little more energy 2. CHF exacerbation- continue Lasix daily per Cardiology, will likely require daily dosage of Lasix long term 3. Deconditioning- patient encouraged to continued to ambulate and increase activity as tolerated. 4. Dispo- RTC in 6 weeks with repeat CXR.Marland Kitchen Patient encouraged to contact us sooner if activity level does not improve  Mauriana Dann, PA-C [facility] [phone]

## 2015-10-27 ENCOUNTER — Encounter: Payer: Self-pay | Admitting: Physician Assistant

## 2015-10-27 ENCOUNTER — Other Ambulatory Visit: Payer: Self-pay | Admitting: Physician Assistant

## 2015-10-27 NOTE — Progress Notes (Signed)
Cardiology Office Note    Date:  10/30/2015  ID:  [name] [name], DOB 21-Dec-1928, MRN [id number] PCP:  Maricela Curet, MD  Cardiologist:  Branch   Chief Complaint: F/U CHF  History of Present Illness:  [name] [name] is a 82 y.o. male with history of CAD (NSTEMI with LM involvement s/p CABG 09/2015 with component of cardiogenic shock requiring IABP and inotrope support), post-op afib with bradycardia/vomiting on amiodarone, vocal cord CA, chronic combined CHF, HTN, hypothyroidism, COPD, prostate CA who presents for f/u.   Per review of chart, he was admitted 08/2015 with NSTEMI/near-STEMI equivalent with multivessel CAD and severe LV failure on cath with EF 25-30%. He required IABP and underwent CABG x 3 09/22/15 utilizing LIMA to LAD, SVG to distal RCA, and SVG to Acute Marginal. He developed rate controlled afib and developed bradycardia on amiodarone, requiring reduction. This was later d/c'd due to nausea/vomiting. He was not felt to be a candidate for NOAC due to his advanced age. He was started on Plavix for Endarterectomy to the LAD for 30 days of treatment per CVTS. He was readmitted in 09/2015 for acute on chronic combined CHF, hyponatremia, hypomagnesemia, and hypokalemia, and deconditioning. He required IV diuresis. 2D Echo 10/18/15: mod LVH, EF 40-45%, grade 2 DD, mod AI, proximal ascending aorta mildly dilated to 4cm, mod LAE, mild RAE, mild MR, mild-mod TR, PASP 43. Coreg was decreased 10/23/15 due to fatigue. Last labs showed nl LFTs 7/25; 10/21/15: Na 130 (prev 118), BUN/Cr 14/0.82, Hgb 10.2 (improving). He was in NSR during that admission and Dr. Bronson Ing recommended not to anticoagulate at that time.  He presents back for f/u overall feeling well. His SOB has improved. No further chest pain. He is walking 3x/day. No ankle edema.  Past Medical History:  Diagnosis Date  . Bradycardia    a. 2/2 amio after CABG 2017  . CAD in native artery    a. NSTEMI/near-STEMI equivalent  08/2015 s/p CABG.  . Chronic combined systolic and diastolic CHF (congestive heart failure) ([place])   . DJD (degenerative joint disease)   . First degree heart block   . GERD (gastroesophageal reflux disease)   . H/O hiatal hernia   . History of cancer of larynx 1996--- S/P  REMOVAL 1/3 VOICE BOX  AND RADIATION TX   NO RECURRENCE---  RESIDUAL , PT SPEECH A WHISPER  . Hypertension   . Hypertensive heart disease   . Hyponatremia   . Hypothyroidism   . Non-STEMI (non-ST elevated myocardial infarction) ([place]) 09/18/2015  . Paroxysmal atrial fibrillation (H[facility])    a. post op CABG 2017 -> brady/n/v on amiodarone; not anticoagulated due to advanced age per notes.  . Prostate cancer ([place]) STAGE T2a,     FOLLOWED BY DR Diona Fanti AND DR MANNING  . S/P CABG (coronary artery bypass graft)   . Weakness of voice PT CAN ONLY WHISPER---  SECONDARY TO VOCAL CORD CA  S/P REMOVAL 1/3  VOICE BOX    Past Surgical History:  Procedure Laterality Date  . CARDIAC CATHETERIZATION N/A 09/18/2015   Procedure: Left Heart Cath and Coronary Angiography;  Surgeon: Troy Sine, MD;  Location: [facility];  Service: Cardiovascular;  Laterality: N/A;  . CARDIAC CATHETERIZATION N/A 09/21/2015   Procedure: IABP Insertion;  Surgeon: Leonie Man, MD;  Location: [facility];  Service: Cardiovascular;  Laterality: N/A;  . CARDIOVASCULAR STRESS TEST  5 YRS AGO   PT STATES NORMAL  . CATARACT EXTRACTION W/  INTRAOCULAR LENS  IMPLANT, BILATERAL    . CORONARY ARTERY BYPASS GRAFT N/A 09/22/2015   Procedure: CORONARY ARTERY BYPASS GRAFTING (CABG) TIMES  THREE  USING LEFT INTERNAL MAMMARY ARTERY AND RIGHT SAPHENOUS VEIN HARVESTED ENDOSCOPICALLY, CORONARY ENDARTERECTOMY;  Surgeon: Ivin Poot, MD;  Location: [facility];  Service: Open Heart Surgery;  Laterality: N/A;  . CYSTOSCOPY  08/02/2011   Procedure: CYSTOSCOPY FLEXIBLE;  Surgeon: Franchot Gallo, MD;  Location[facility];  Service: Urology;   Laterality: N/A;  . LARYNX SURGERY  x3  1996   BX'S AND 1/3 OF VOICE BOX REMOVED DUE TO CANCER--  NO RECURRENCE---  (RESIDUAL , WHISPERS)  . RADIOACTIVE SEED IMPLANT  08/02/2011   Procedure: RADIOACTIVE SEED IMPLANT;  Surgeon: Franchot Gallo, MD;  Location: [facility];  Service: Urology;  Laterality: N/A;  C-ARM RAD TECH OK PER B[facility] AT MAIN  . TEE WITHOUT CARDIOVERSION N/A 09/22/2015   Procedure: TRANSESOPHAGEAL ECHOCARDIOGRAM (TEE);  Surgeon: Ivin Poot, MD;  Location: [facility];  Service: Open Heart Surgery;  Laterality: N/A;    Current Medications: Current Outpatient Prescriptions  Medication Sig Dispense Refill  . aspirin EC 81 MG EC tablet Take 1 tablet (81 mg total) by mouth daily.    Marland Kitchen atorvastatin (LIPITOR) 80 MG tablet Take 1 tablet (80 mg total) by mouth daily at 6 PM. 30 tablet 3  . Bilberry, Vaccinium myrtillus, (BILBERRY PO) Take 2 tablets by mouth daily.     . carvedilol (COREG) 3.125 MG tablet Take 3.125 mg by mouth 2 (two) times daily with a meal.    . clopidogrel (PLAVIX) 75 MG tablet Take 1 tablet (75 mg total) by mouth daily. 30 tablet 6  . furosemide (LASIX) 40 MG tablet Take 1 tablet (40 mg total) by mouth daily. 90 tablet 3  . GARLIC PO Take 1 tablet by mouth daily.    Marland Kitchen levothyroxine (SYNTHROID, LEVOTHROID) 50 MCG tablet Take 50 mcg by mouth every morning.    Marland Kitchen lisinopril (PRINIVIL,ZESTRIL) 20 MG tablet Take 1 tablet (20 mg total) by mouth daily. 30 tablet 3  . Multiple Vitamins-Minerals (PRESERVISION AREDS 2) CAPS Take 2 capsules by mouth daily.     Marland Kitchen omeprazole (PRILOSEC) 20 MG capsule Take 20 mg by mouth every morning.     . potassium chloride SA (K-DUR,KLOR-CON) 20 MEQ tablet Take 2 tablets (40 mEq total) by mouth 2 (two) times daily. 60 tablet 2   No current facility-administered medications for this visit.      Allergies:   Sulfa antibiotics and Contrast media [iodinated diagnostic agents]   Social History   Social History  . Marital  status: Married    Spouse name: N/A  . Number of children: N/A  . Years of education: N/A   Social History Main Topics  . Smoking status: Former Smoker    Packs/day: 3.00    Years: 45.00    Types: Cigarettes    Quit date: 07/31/1990  . Smokeless tobacco: Never Used  . Alcohol use No  . Drug use: No  . Sexual activity: Not Asked   Other Topics Concern  . None   Social History Narrative  . None     Family History:  The patient's family history is positive for CHF and stroke  ROS:   Please see the history of present illness. No syncope All other systems are reviewed and otherwise negative.    PHYSICAL EXAM:   VS:  BP 110/62   Pulse 79  Ht 5\' 10"  (1.778 m)   Wt 166 lb (75.3 kg)   SpO2 95%   BMI 23.82 kg/m   BMI: Body mass index is 23.82 kg/m. GEN: Well nourished, well developed elderly WM, in no acute distress HEENT: normocephalic, atraumatic - chronically whispers Neck: no JVD, carotid bruits, or masses Cardiac: RRR; no murmurs, rubs, or gallops, no edema  Respiratory:  clear to auscultation bilaterally, normal work of breathing GI: soft, nontender, nondistended, + BS MS: no deformity other than kyphosis, no atrophy  Skin: warm and dry, no rash Neuro:  Alert and Oriented x 3, Strength and sensation are intact, follows commands Psych: euthymic mood, full affect  Wt Readings from Last 3 Encounters:  10/30/15 166 lb (75.3 kg)  10/23/15 173 lb (78.5 kg)  10/21/15 173 lb 8 oz (78.7 kg)      Studies/Labs Reviewed:   EKG:  EKG was ordered today and personally reviewed by me and demonstrates NSR 73bpm, nonspecific TWI II, III, avF, V6 seen on prior  Recent Labs: 10/17/2015: ALT 15; B Natriuretic Peptide 1,809.0; TSH 2.282 10/18/2015: Hemoglobin 10.2; Platelets 305 10/21/2015: BUN 14; Creatinine, Ser 0.82; Magnesium 1.7; Potassium 4.5; Sodium 130   Lipid Panel    Component Value Date/Time   CHOL 164 09/18/2015 0339   TRIG 77 09/18/2015 0339   HDL 33 (L)  09/18/2015 0339   CHOLHDL 5.0 09/18/2015 0339   VLDL 15 09/18/2015 0339   LDLCALC 116 (H) 09/18/2015 0339    Additional studies/ records that were reviewed today include: Summarized above.    ASSESSMENT & PLAN:   1. CAD - stable. He is on Prilosec which can render Plavix less effective. I have recommended he switch to Protonix daily. His daughter expressed hesitancy in making the change. She does not want him to go on an H2 blocker either. I discussed the possible interaction and recommended he try the change. If insurance will not cover, I would advocate submitting a letter that recommends the switch. Per notes, CVTS may not keep him on Plavix long term. Given his MI he may benefit from a year of Plavix but will await input from Dr. Prescott Gum in follow-up in September. If he goes off Plavix then he can go back on Prilosec as before.  2. Chronic combined CHF/hypertensive heart disease - improved clinically. Reviewed daily weights, low sodium diet. Continue BP control, ACEI, BB. He has noticed continued weight loss which is not unusual post CABG. He reports appetite is getting back to normal. Recheck BMET and reduce Lasix if any signs of drying out. 3. Hyponatremia - recheck BMET today. 4. HTN - well controlled. Continue present regimen. 5. Paroxysmal atrial fib - maintaining NSR. Per recent cardiology notes, not a good candidate for anticoagulation. Observe for recurrence.  Disposition: F/u with Dr. Harl Bowie in 6-8 weeks.   Medication Adjustments/Labs and Tests Ordered: Current medicines are reviewed at length with the patient today.  Concerns regarding medicines are outlined above. Medication changes, Labs and Tests ordered today are summarized above and listed in the Patient Instructions accessible in Encounters.   Signed, Melina Copa PA-C  10/30/2015 1:43 PM    [facility] [place] Location in [place]. [place], [place] [address] Ph:  [phone]; Fax [phone]

## 2015-10-30 ENCOUNTER — Other Ambulatory Visit: Payer: Self-pay

## 2015-10-30 ENCOUNTER — Encounter: Payer: Self-pay | Admitting: Physician Assistant

## 2015-10-30 ENCOUNTER — Ambulatory Visit (INDEPENDENT_AMBULATORY_CARE_PROVIDER_SITE_OTHER): Payer: Medicare HMO | Admitting: Physician Assistant

## 2015-10-30 ENCOUNTER — Other Ambulatory Visit (HOSPITAL_COMMUNITY)
Admission: RE | Admit: 2015-10-30 | Discharge: 2015-10-30 | Disposition: A | Discharge status: Home / Self Care | Payer: Medicare HMO | Source: Ambulatory Visit | Attending: Physician Assistant | Admitting: Physician Assistant

## 2015-10-30 VITALS — BP 110/62 | HR 79 | Ht 70.0 in | Wt 166.0 lb

## 2015-10-30 DIAGNOSIS — I10 Essential (primary) hypertension: Secondary | ICD-10-CM

## 2015-10-30 DIAGNOSIS — I2510 Atherosclerotic heart disease of native coronary artery without angina pectoris: Secondary | ICD-10-CM

## 2015-10-30 DIAGNOSIS — I5042 Chronic combined systolic (congestive) and diastolic (congestive) heart failure: Secondary | ICD-10-CM | POA: Diagnosis not present

## 2015-10-30 DIAGNOSIS — I110 Hypertensive heart disease with heart failure: Secondary | ICD-10-CM

## 2015-10-30 DIAGNOSIS — I480 Paroxysmal atrial fibrillation: Secondary | ICD-10-CM

## 2015-10-30 DIAGNOSIS — E871 Hypo-osmolality and hyponatremia: Secondary | ICD-10-CM

## 2015-10-30 LAB — BASIC METABOLIC PANEL
Anion gap: 5 (ref 5–15)
BUN: 15 mg/dL (ref 6–20)
CHLORIDE: 102 mmol/L (ref 101–111)
CO2: 27 mmol/L (ref 22–32)
CREATININE: 1.08 mg/dL (ref 0.61–1.24)
Calcium: 8.2 mg/dL — ABNORMAL LOW (ref 8.9–10.3)
GFR calc Af Amer: >60 mL/min (ref >60)
GFR calc non Af Amer: >60 mL/min (ref >60)
Glucose, Bld: 99 mg/dL (ref 65–99)
Potassium: 4.3 mmol/L (ref 3.5–5.1)
Sodium: 134 mmol/L — ABNORMAL LOW (ref 135–145)

## 2015-10-30 MED ORDER — CLOPIDOGREL BISULFATE 75 MG PO TABS: 75.0000 mg | ORAL_TABLET | Freq: Every day | ORAL | 6 refills | Status: DC

## 2015-10-30 MED ORDER — PANTOPRAZOLE SODIUM 40 MG PO TBEC: 40.0000 mg | DELAYED_RELEASE_TABLET | Freq: Every day | ORAL | 2 refills | Status: DC

## 2015-10-30 MED ORDER — CLOPIDOGREL BISULFATE 75 MG PO TABS: 75.0000 mg | ORAL_TABLET | Freq: Every day | ORAL | 0 refills | Status: DC

## 2015-10-30 NOTE — Patient Instructions (Signed)
Medication Instructions:  WE RECOMMEND THAT YOU STOP TAKING PRILOSEC AND START TAKING PROTONIX 40 MG ONE TIME DAILY Labwork: Your physician recommends that you return for lab work in: TODAY BMET    Testing/Procedures: NONE  Follow-Up: Your physician recommends that you schedule a follow-up appointment in: 6-8 [place] DR. BRANCH    Any Other Special Instructions Will Be Listed Below (If Applicable).     If you need a refill on your cardiac medications before your next appointment, please call your pharmacy.

## 2015-10-30 NOTE — Telephone Encounter (Signed)
Refilled rx for Plavix

## 2015-12-05 ENCOUNTER — Other Ambulatory Visit: Payer: Self-pay | Admitting: Cardiothoracic Surgery

## 2015-12-05 DIAGNOSIS — I25119 Atherosclerotic heart disease of native coronary artery with unspecified angina pectoris: Secondary | ICD-10-CM

## 2015-12-06 ENCOUNTER — Ambulatory Visit
Admission: RE | Admit: 2015-12-06 | Discharge: 2015-12-06 | Disposition: A | Discharge status: Home / Self Care | Payer: Medicare HMO | Source: Ambulatory Visit | Attending: Cardiothoracic Surgery | Admitting: Cardiothoracic Surgery

## 2015-12-06 ENCOUNTER — Ambulatory Visit (INDEPENDENT_AMBULATORY_CARE_PROVIDER_SITE_OTHER): Payer: Self-pay | Admitting: Cardiothoracic Surgery

## 2015-12-06 ENCOUNTER — Encounter: Payer: Self-pay | Admitting: Cardiothoracic Surgery

## 2015-12-06 VITALS — BP 140/82 | HR 75 | Resp 20 | Ht 70.0 in | Wt 166.0 lb

## 2015-12-06 DIAGNOSIS — Z951 Presence of aortocoronary bypass graft: Secondary | ICD-10-CM

## 2015-12-06 DIAGNOSIS — I25119 Atherosclerotic heart disease of native coronary artery with unspecified angina pectoris: Secondary | ICD-10-CM

## 2015-12-06 DIAGNOSIS — I25110 Atherosclerotic heart disease of native coronary artery with unstable angina pectoris: Secondary | ICD-10-CM

## 2015-12-06 NOTE — Progress Notes (Signed)
PCP is Maricela Curet, MD Referring Provider is Troy Sine, MD  Chief Complaint  Patient presents with  . Routine Post Op    6 week f/u with CXR    HPI:82 year old Caucasian male returns for final postop visit almost 3 months after urgent CABG x 3  with LAD endarterectomy. The patient's ejection fraction at the time of surgery was approximately 25%. 3-4 weeks later echocardiogram showed EF of 40-45 percent. He had transient atrial fibrillation which is now converted to sinus rhythm. He is felt to be too old to take  anticoagulation.  He has made significant improvement since his last office visit. He is anxious to increase his activity levels as he has had no recurrent angina or shortness of breath with exertion. Surgical incisions are well-healed. He has minimal edema. He has concerns over medications. I discussed specifically that his postop Plavix would be needed only for 60 days and then aspirin daily would be adequate for the endarterectomy. The patient states he has had a dry cough which she associates with lisinopril. He'll discuss using an alternative possible ARB with his primary physician Chest x-ray today is personally reviewed and is clear, no pleural effusion or edema. Sternal wires intact and well aligned  Past Medical History:  Diagnosis Date  . Bradycardia    a. 2/2 amio after CABG 2017  . CAD in native artery    a. NSTEMI/near-STEMI equivalent 08/2015 s/p CABG.  . Chronic combined systolic and diastolic CHF (congestive heart failure) ([place])   . DJD (degenerative joint disease)   . First degree heart block   . GERD (gastroesophageal reflux disease)   . H/O hiatal hernia   . History of cancer of larynx 1996--- S/P  REMOVAL 1/3 VOICE BOX  AND RADIATION TX   NO RECURRENCE---  RESIDUAL , PT SPEECH A WHISPER  . Hypertension   . Hypertensive heart disease   . Hyponatremia   . Hypothyroidism   . Non-STEMI (non-ST elevated myocardial infarction) ([place]) 09/18/2015  .  Paroxysmal atrial fibrillation (H[facility])    a. post op CABG 2017 -> brady/n/v on amiodarone; not anticoagulated due to advanced age per notes.  . Prostate cancer ([place]) STAGE T2a,     FOLLOWED BY DR Diona Fanti AND DR MANNING  . S/P CABG (coronary artery bypass graft)   . Weakness of voice PT CAN ONLY WHISPER---  SECONDARY TO VOCAL CORD CA  S/P REMOVAL 1/3  VOICE BOX    Past Surgical History:  Procedure Laterality Date  . CARDIAC CATHETERIZATION N/A 09/18/2015   Procedure: Left Heart Cath and Coronary Angiography;  Surgeon: Troy Sine, MD;  Location: [facility];  Service: Cardiovascular;  Laterality: N/A;  . CARDIAC CATHETERIZATION N/A 09/21/2015   Procedure: IABP Insertion;  Surgeon: Leonie Man, MD;  Location: [facility];  Service: Cardiovascular;  Laterality: N/A;  . CARDIOVASCULAR STRESS TEST  5 YRS AGO   PT STATES NORMAL  . CATARACT EXTRACTION W/ INTRAOCULAR LENS  IMPLANT, BILATERAL    . CORONARY ARTERY BYPASS GRAFT N/A 09/22/2015   Procedure: CORONARY ARTERY BYPASS GRAFTING (CABG) TIMES  THREE  USING LEFT INTERNAL MAMMARY ARTERY AND RIGHT SAPHENOUS VEIN HARVESTED ENDOSCOPICALLY, CORONARY ENDARTERECTOMY;  Surgeon: Ivin Poot, MD;  Location: [facility];  Service: Open Heart Surgery;  Laterality: N/A;  . CYSTOSCOPY  08/02/2011   Procedure: CYSTOSCOPY FLEXIBLE;  Surgeon: Franchot Gallo, MD;  Location: [facility];  Service: Urology;  Laterality: N/A;  . LARYNX SURGERY  x3  1996   BX'S AND 1/3 OF VOICE BOX REMOVED DUE TO CANCER--  NO RECURRENCE---  (RESIDUAL , WHISPERS)  . RADIOACTIVE SEED IMPLANT  08/02/2011   Procedure: RADIOACTIVE SEED IMPLANT;  Surgeon: Franchot Gallo, MD;  Location: [facility];  Service: Urology;  Laterality: N/A;  C-ARM RAD TECH OK PER B[facility] AT MAIN  . TEE WITHOUT CARDIOVERSION N/A 09/22/2015   Procedure: TRANSESOPHAGEAL ECHOCARDIOGRAM (TEE);  Surgeon: Ivin Poot, MD;  Location: [facility];  Service: Open Heart  Surgery;  Laterality: N/A;    Family History  Problem Relation Age of Onset  . Congestive Heart Failure Mother   . Stroke Father     Social History Social History  Substance Use Topics  . Smoking status: Former Smoker    Packs/day: 3.00    Years: 45.00    Types: Cigarettes    Quit date: 07/31/1990  . Smokeless tobacco: Never Used  . Alcohol use No    Current Outpatient Prescriptions  Medication Sig Dispense Refill  . aspirin EC 81 MG EC tablet Take 1 tablet (81 mg total) by mouth daily.    Marland Kitchen atorvastatin (LIPITOR) 80 MG tablet Take 1 tablet (80 mg total) by mouth daily at 6 PM. 30 tablet 3  . Bilberry, Vaccinium myrtillus, (BILBERRY PO) Take 2 tablets by mouth daily.     . carvedilol (COREG) 3.125 MG tablet Take 3.125 mg by mouth 2 (two) times daily with a meal.    . furosemide (LASIX) 40 MG tablet Take 1 tablet (40 mg total) by mouth daily. 90 tablet 3  . GARLIC PO Take 1 tablet by mouth daily.    Marland Kitchen levothyroxine (SYNTHROID, LEVOTHROID) 50 MCG tablet Take 50 mcg by mouth every morning.    Marland Kitchen lisinopril (PRINIVIL,ZESTRIL) 20 MG tablet Take 1 tablet (20 mg total) by mouth daily. 30 tablet 3  . Multiple Vitamins-Minerals (PRESERVISION AREDS 2) CAPS Take 2 capsules by mouth daily.     Marland Kitchen omeprazole (PRILOSEC) 20 MG capsule Take 20 mg by mouth every morning.     . potassium chloride SA (K-DUR,KLOR-CON) 20 MEQ tablet Take 2 tablets (40 mEq total) by mouth 2 (two) times daily. (Patient taking differently: Take 40 mEq by mouth daily. ) 60 tablet 2  . clopidogrel (PLAVIX) 75 MG tablet Take 1 tablet (75 mg total) by mouth daily. (Patient not taking: Reported on 12/06/2015) 30 tablet 0   No current facility-administered medications for this visit.     Allergies  Allergen Reactions  . Sulfa Antibiotics Palpitations  . Contrast Media [Iodinated Diagnostic Agents] Rash    ivp dye---  Pt states w/ pre-treatment does okay    Review of Systems  Improved appetite Improved exercise  tolerance No angina or symptoms of CHF Problems with BPH nocturia polyuria  BP 140/82 (BP Location: Right Arm, Patient Position: Sitting, Cuff Size: Normal)   Pulse 75   Resp 20   Ht 5\' 10"  (1.778 m)   Wt 166 lb (75.3 kg)   SpO2 94% Comment: RA  BMI 23.82 kg/m  Physical Exam      Exam    General- alert and comfortable   Lungs- clear without rales, wheezes   Cor- regular rate and rhythm, no murmur , gallop   Abdomen- soft, non-tender   Extremities - warm, non-tender, minimal edema   Neuro- oriented, appropriate, no focal weakness   Diagnostic Tests: Chest x-ray clear  Impression: Excellent recovery after multivessel CABG with combined LAD endarterectomy After October  1 he will have no activity restrictions.  Plan: Return as needed. Continue heart healthy lifestyle including adherence to medications especially aspirin Coreg and Lipitor  Len Childs, MD [facility] [phone]

## 2015-12-22 NOTE — Telephone Encounter (Signed)
ERROR

## 2015-12-26 ENCOUNTER — Encounter: Payer: Self-pay | Admitting: Cardiology

## 2015-12-26 ENCOUNTER — Ambulatory Visit (INDEPENDENT_AMBULATORY_CARE_PROVIDER_SITE_OTHER): Payer: Medicare HMO | Admitting: Cardiology

## 2015-12-26 VITALS — BP 118/70 | HR 68 | Ht 70.0 in | Wt 188.0 lb

## 2015-12-26 DIAGNOSIS — I2510 Atherosclerotic heart disease of native coronary artery without angina pectoris: Secondary | ICD-10-CM

## 2015-12-26 DIAGNOSIS — I4891 Unspecified atrial fibrillation: Secondary | ICD-10-CM | POA: Diagnosis not present

## 2015-12-26 DIAGNOSIS — Z79899 Other long term (current) drug therapy: Secondary | ICD-10-CM

## 2015-12-26 DIAGNOSIS — I5043 Acute on chronic combined systolic (congestive) and diastolic (congestive) heart failure: Secondary | ICD-10-CM

## 2015-12-26 MED ORDER — TORSEMIDE 20 MG PO TABS: 20.0000 mg | ORAL_TABLET | Freq: Two times a day (BID) | ORAL | 3 refills | Status: DC

## 2015-12-26 NOTE — Patient Instructions (Signed)
Your physician recommends that you schedule a follow-up appointment in: 1 week     STOP Lasix   START Torsemide 20 mg twice a day     Get lab work next week :Tuesday   Call next week with weight on Friday    Thank you for choosing [facility] !

## 2015-12-26 NOTE — Progress Notes (Signed)
Clinical Summary [name] [name] is a 82 y.o.male seen today for follow up of the following medical problems.   1. CAD - admit 09/2015 with chest pain and NSTEMI - cath showed multivessel CAD with LM involvement - had CABGutilizing LIMA to LAD, SVG to distal RCA, and SVG to Acute Marginal. Some component of cardiogenic shock requiring IABP and inotrope support transiently.  - 08/2015 echo LVEF 50-55%, no WMAs.   - recent SOB. No chest pain. DOE with activites, +orthopnea. Increased LE edema. Weight 182 lbs at discharge, today 190 lbs.   2. Postoperative Afib - bradycardia on amio - not started on anticoag reportedly due to age in the past.  - denies any palpitations. Have not detected a recurrence.    3. Vocal cord cancer   4. Chronic combined systolic/diastoilc HF - admit 09/2015 with acute on chronic CHF - echo 09/2015 LVEF 40-45%, grade II diastolic dysfunction. Weight up  - weight at Dr Corliss [place] in early Sept 166 lbs.   - home weights around 176 lbs. Baseline in mid 160s.  - increased SOB. Recent LE edema. - had been walking a mile a day a few weeks ago, now trouble walking short distances - lasix was increased to 20mg  tid by pcp. Has not improved symptoms.   Past Medical History:  Diagnosis Date  . Bradycardia    a. 2/2 amio after CABG 2017  . CAD in native artery    a. NSTEMI/near-STEMI equivalent 08/2015 s/p CABG.  . Chronic combined systolic and diastolic CHF (congestive heart failure) ([place])   . DJD (degenerative joint disease)   . First degree heart block   . GERD (gastroesophageal reflux disease)   . H/O hiatal hernia   . History of cancer of larynx 1996--- S/P  REMOVAL 1/3 VOICE BOX  AND RADIATION TX   NO RECURRENCE---  RESIDUAL , PT SPEECH A WHISPER  . Hypertension   . Hypertensive heart disease   . Hyponatremia   . Hypothyroidism   . Non-STEMI (non-ST elevated myocardial infarction) ([place]) 09/18/2015  . Paroxysmal atrial fibrillation (H[facility])    a. post  op CABG 2017 -> brady/n/v on amiodarone; not anticoagulated due to advanced age per notes.  . Prostate cancer ([place]) STAGE T2a,     FOLLOWED BY DR Diona Fanti AND DR MANNING  . S/P CABG (coronary artery bypass graft)   . Weakness of voice PT CAN ONLY WHISPER---  SECONDARY TO VOCAL CORD CA  S/P REMOVAL 1/3  VOICE BOX     Allergies  Allergen Reactions  . Sulfa Antibiotics Palpitations  . Contrast Media [Iodinated Diagnostic Agents] Rash    ivp dye---  Pt states w/ pre-treatment does okay     Current Outpatient Prescriptions  Medication Sig Dispense Refill  . aspirin EC 81 MG EC tablet Take 1 tablet (81 mg total) by mouth daily.    Marland Kitchen atorvastatin (LIPITOR) 80 MG tablet Take 1 tablet (80 mg total) by mouth daily at 6 PM. 30 tablet 3  . Bilberry, Vaccinium myrtillus, (BILBERRY PO) Take 2 tablets by mouth daily.     . carvedilol (COREG) 3.125 MG tablet Take 3.125 mg by mouth 2 (two) times daily with a meal.    . clopidogrel (PLAVIX) 75 MG tablet Take 1 tablet (75 mg total) by mouth daily. (Patient not taking: Reported on 12/06/2015) 30 tablet 0  . furosemide (LASIX) 40 MG tablet Take 1 tablet (40 mg total) by mouth daily. 90 tablet 3  . GARLIC  PO Take 1 tablet by mouth daily.    Marland Kitchen levothyroxine (SYNTHROID, LEVOTHROID) 50 MCG tablet Take 50 mcg by mouth every morning.    Marland Kitchen lisinopril (PRINIVIL,ZESTRIL) 20 MG tablet Take 1 tablet (20 mg total) by mouth daily. 30 tablet 3  . Multiple Vitamins-Minerals (PRESERVISION AREDS 2) CAPS Take 2 capsules by mouth daily.     Marland Kitchen omeprazole (PRILOSEC) 20 MG capsule Take 20 mg by mouth every morning.     . potassium chloride SA (K-DUR,KLOR-CON) 20 MEQ tablet Take 2 tablets (40 mEq total) by mouth 2 (two) times daily. (Patient taking differently: Take 40 mEq by mouth daily. ) 60 tablet 2   No current facility-administered medications for this visit.      Past Surgical History:  Procedure Laterality Date  . CARDIAC CATHETERIZATION N/A 09/18/2015   Procedure:  Left Heart Cath and Coronary Angiography;  Surgeon: Troy Sine, MD;  Location: [facility];  Service: Cardiovascular;  Laterality: N/A;  . CARDIAC CATHETERIZATION N/A 09/21/2015   Procedure: IABP Insertion;  Surgeon: Leonie Man, MD;  Location: [facility];  Service: Cardiovascular;  Laterality: N/A;  . CARDIOVASCULAR STRESS TEST  5 YRS AGO   PT STATES NORMAL  . CATARACT EXTRACTION W/ INTRAOCULAR LENS  IMPLANT, BILATERAL    . CORONARY ARTERY BYPASS GRAFT N/A 09/22/2015   Procedure: CORONARY ARTERY BYPASS GRAFTING (CABG) TIMES  THREE  USING LEFT INTERNAL MAMMARY ARTERY AND RIGHT SAPHENOUS VEIN HARVESTED ENDOSCOPICALLY, CORONARY ENDARTERECTOMY;  Surgeon: Ivin Poot, MD;  Location: [facility];  Service: Open Heart Surgery;  Laterality: N/A;  . CYSTOSCOPY  08/02/2011   Procedure: CYSTOSCOPY FLEXIBLE;  Surgeon: Franchot Gallo, MD;  Location: [facility];  Service: Urology;  Laterality: N/A;  . LARYNX SURGERY  x3  1996   BX'S AND 1/3 OF VOICE BOX REMOVED DUE TO CANCER--  NO RECURRENCE---  (RESIDUAL , WHISPERS)  . RADIOACTIVE SEED IMPLANT  08/02/2011   Procedure: RADIOACTIVE SEED IMPLANT;  Surgeon: Franchot Gallo, MD;  Location[facility];  Service: Urology;  Laterality: N/A;  C-ARM RAD TECH OK PER [place] AT MAIN  . TEE WITHOUT CARDIOVERSION N/A 09/22/2015   Procedure: TRANSESOPHAGEAL ECHOCARDIOGRAM (TEE);  Surgeon: Ivin Poot, MD;  Location: [facility];  Service: Open Heart Surgery;  Laterality: N/A;     Allergies  Allergen Reactions  . Sulfa Antibiotics Palpitations  . Contrast Media [Iodinated Diagnostic Agents] Rash    ivp dye---  Pt states w/ pre-treatment does okay      Family History  Problem Relation Age of Onset  . Congestive Heart Failure Mother   . Stroke Father      Social History Mr. Gul reports that he quit smoking about 25 years ago. His smoking use included Cigarettes. He has a 135.00 pack-year smoking history. He  has never used smokeless tobacco. Mr. Hilburn reports that he does not drink alcohol.   Review of Systems CONSTITUTIONAL: No weight loss, fever, chills, weakness or fatigue.  HEENT: Eyes: No visual loss, blurred vision, double vision or yellow sclerae.No hearing loss, sneezing, congestion, runny nose or sore throat.  SKIN: No rash or itching.  CARDIOVASCULAR: per HPI RESPIRATORY: No shortness of breath, cough or sputum.  GASTROINTESTINAL: No anorexia, nausea, vomiting or diarrhea. No abdominal pain or blood.  GENITOURINARY: No burning on urination, no polyuria NEUROLOGICAL: No headache, dizziness, syncope, paralysis, ataxia, numbness or tingling in the extremities. No change in bowel or bladder control.  MUSCULOSKELETAL: No muscle,  back pain, joint pain or stiffness.  LYMPHATICS: No enlarged nodes. No history of splenectomy.  PSYCHIATRIC: No history of depression or anxiety.  ENDOCRINOLOGIC: No reports of sweating, cold or heat intolerance. No polyuria or polydipsia.  Marland Kitchen   Physical Examination Vitals:   12/26/15 1257  BP: 118/70  Pulse: 68   Vitals:   12/26/15 1257  Weight: 188 lb (85.3 kg)  Height: 5\' 10"  (1.778 m)    Gen: resting comfortably, no acute distress HEENT: no scleral icterus, pupils equal round and reactive, no palptable cervical adenopathy,  CV: RRR, no m/r/g, no jvd Resp: Clear to auscultation bilaterally GI: abdomen is soft, non-tender, non-distended, normal bowel sounds, no hepatosplenomegaly MSK: extremities are warm, no edema.  Skin: warm, no rash Neuro:  no focal deficits Psych: appropriate affect   Diagnostic Studies 09/2015 echo Study Conclusions  - Left ventricle: The cavity size was normal. Wall thickness was   increased in a pattern of moderate LVH. Systolic function was   mildly to moderately reduced. The estimated ejection fraction was   in the range of 40% to 45%. Diffuse hypokinesis. Features are   consistent with a pseudonormal left  ventricular filling pattern,   with concomitant abnormal relaxation and increased filling   pressure (grade 2 diastolic dysfunction). Doppler parameters are   consistent with high ventricular filling pressure. - Aortic valve: Mildly to moderately calcified annulus. Trileaflet;   mildly thickened leaflets. There was moderate regurgitation. Mean   gradient (S): 4 mm Hg. Valve area (VTI): 1.84 cm^2. Valve area   (Vmax): 1.96 cm^2. Valve area (Vmean): 1.64 cm^2. - Aortic root: The proximal ascending aorta just distal to the   sinotubular junction is mildly dilated at 4.0 cm. - Mitral valve: Mildly to moderately calcified annulus. Mildly   thickened leaflets . There was mild regurgitation. - Left atrium: The atrium was moderately dilated. - Right atrium: The atrium was mildly dilated. - Atrial septum: No defect or patent foramen ovale was identified. - Tricuspid valve: There was mild-moderate regurgitation. - Pulmonary arteries: PASP is at least 43 mmHg. It is at least   moderately elevated. Cannot estimate RA pressure, IVC poorly   visualized. - Pericardium, extracardiac: Large left pleural effusion. - Technically adequate study.     Assessment and Plan  1. CAD -no recent chest pain - continue current meds  2. Acute on chronic combined systolic/diastolic HF - recent weight gain, edema, worsening SOB - did not improve with increased lasix - we will change to torsemide 20mg  bid. Check BMET/Mg/TSH in 1 week - he will update [place] on Friday wit his home weight.s   3. Post op Afib - no previous history, developed post op afib after CABG. Did not tolerate amio due to bradycardia. Was not started on anticoag at discharge. - EKG today shows NSR, appears post op afib has resolved. Continue to monitor at this time, no indication for anticoagulation at this time unless documented recurrence.    F/u 1 week   Arnoldo Lenis, M.D.

## 2016-01-01 ENCOUNTER — Other Ambulatory Visit: Payer: Self-pay | Admitting: Cardiology

## 2016-01-01 LAB — BASIC METABOLIC PANEL
BUN: 30 mg/dL — ABNORMAL HIGH (ref 7–25)
CALCIUM: 8.5 mg/dL — AB (ref 8.6–10.3)
CO2: 29 mmol/L (ref 20–31)
CREATININE: 1.98 mg/dL — AB (ref 0.70–1.11)
Chloride: 90 mmol/L — ABNORMAL LOW (ref 98–110)
Glucose, Bld: 115 mg/dL — ABNORMAL HIGH (ref 65–99)
Potassium: 4.4 mmol/L (ref 3.5–5.3)
SODIUM: 129 mmol/L — AB (ref 135–146)

## 2016-01-01 LAB — TSH: TSH: 4.86 mIU/L — ABNORMAL HIGH (ref 0.40–4.50)

## 2016-01-01 LAB — MAGNESIUM: MAGNESIUM: 1.8 mg/dL (ref 1.5–2.5)

## 2016-01-02 ENCOUNTER — Ambulatory Visit (HOSPITAL_COMMUNITY)
Admission: RE | Admit: 2016-01-02 | Discharge: 2016-01-02 | Disposition: A | Discharge status: Home / Self Care | Payer: Medicare HMO | Source: Ambulatory Visit | Attending: Adult Health | Admitting: Adult Health

## 2016-01-02 ENCOUNTER — Ambulatory Visit (INDEPENDENT_AMBULATORY_CARE_PROVIDER_SITE_OTHER): Payer: Medicare HMO | Admitting: Adult Health

## 2016-01-02 ENCOUNTER — Encounter: Payer: Self-pay | Admitting: Adult Health

## 2016-01-02 ENCOUNTER — Telehealth: Payer: Self-pay

## 2016-01-02 VITALS — BP 122/78 | HR 62 | Ht 70.0 in | Wt 182.0 lb

## 2016-01-02 DIAGNOSIS — R0609 Other forms of dyspnea: Secondary | ICD-10-CM | POA: Diagnosis not present

## 2016-01-02 DIAGNOSIS — I2510 Atherosclerotic heart disease of native coronary artery without angina pectoris: Secondary | ICD-10-CM

## 2016-01-02 DIAGNOSIS — R918 Other nonspecific abnormal finding of lung field: Secondary | ICD-10-CM | POA: Insufficient documentation

## 2016-01-02 DIAGNOSIS — I5041 Acute combined systolic (congestive) and diastolic (congestive) heart failure: Secondary | ICD-10-CM | POA: Insufficient documentation

## 2016-01-02 DIAGNOSIS — I517 Cardiomegaly: Secondary | ICD-10-CM | POA: Insufficient documentation

## 2016-01-02 DIAGNOSIS — I700 Atherosclerosis of aorta: Secondary | ICD-10-CM | POA: Insufficient documentation

## 2016-01-02 DIAGNOSIS — Z79899 Other long term (current) drug therapy: Secondary | ICD-10-CM

## 2016-01-02 LAB — T4, FREE: Free T4: 1.2 ng/dL (ref 0.8–1.8)

## 2016-01-02 LAB — T3: T3 TOTAL: 70 ng/dL — AB (ref 76–181)

## 2016-01-02 MED ORDER — TORSEMIDE 20 MG PO TABS: 20.0000 mg | ORAL_TABLET | Freq: Every day | ORAL | 3 refills | Status: DC

## 2016-01-02 NOTE — Telephone Encounter (Signed)
-----   Message from Arnoldo Lenis, MD sent at 01/02/2016  9:20 AM EDT ----- Labs show change in kidney function with stronger diuretics. Have him stop torsemide for now. His thyroid tests is abnormal, please add on free T4 and regular T3. How is his swelling doing. Very important he stop torsemide, needs f/u with PA early next week and also repeat BMET/Mg next week  Zandra Abts MD

## 2016-01-02 NOTE — Telephone Encounter (Signed)
Pt states his swelling is doing well. He has an appointment with Curt Bears this afternoon. I also added the free t4 and t3 labs at [facility]. I did explain to him to stop the torsemide and he voiced understanding. I will put orders in to repeat labs next week . ( bmet/magnesium)

## 2016-01-02 NOTE — Patient Instructions (Signed)
Your physician recommends that you schedule a follow-up appointment on Tuesday with Jory Sims, NP.   Your physician has recommended you make the following change in your medication:    Start Torsemide 20 mg Daily   Your physician recommends that you return for lab work on Friday 01/05/16   Have Chest X-Ray Done Today  If you need a refill on your cardiac medications before your next appointment, please call your pharmacy.  Thank you for choosing [facility]!

## 2016-01-02 NOTE — Progress Notes (Signed)
Name: [name] [name]    DOB: [birth year]  Age: 82 y.o.  MR#: [id number]       PCP:  Maricela Curet, MD      Insurance: Payor: [employer] MEDICARE / Plan: [employer] MEDICARE HMO/PPO / Product Type: *No Product type* /   CC:   No chief complaint on file.   VS Vitals:   01/02/16 1529  Pulse: 62  SpO2: 91%  Weight: 182 lb (82.6 kg)  Height: 5\' 10"  (1.778 m)    Weights Current Weight  01/02/16 182 lb (82.6 kg)  12/26/15 188 lb (85.3 kg)  12/06/15 166 lb (75.3 kg)    Blood Pressure  BP Readings from Last 3 Encounters:  12/26/15 118/70  12/06/15 140/82  10/30/15 110/62     Admit date:  (Not on file) Last encounter with RMR:  Visit date not found   Allergy Sulfa antibiotics and Contrast media [iodinated diagnostic agents]  Current Outpatient Prescriptions  Medication Sig Dispense Refill  . albuterol (PROVENTIL) (2.5 MG/3ML) 0.083% nebulizer solution Take 2.5 mg by nebulization 3 (three) times daily.    Marland Kitchen aspirin EC 81 MG EC tablet Take 1 tablet (81 mg total) by mouth daily.    Marland Kitchen atorvastatin (LIPITOR) 80 MG tablet Take 1 tablet (80 mg total) by mouth daily at 6 PM. 30 tablet 3  . Bilberry, Vaccinium myrtillus, (BILBERRY PO) Take 2 tablets by mouth daily.     . carvedilol (COREG) 3.125 MG tablet Take 3.125 mg by mouth 2 (two) times daily with a meal.    . cetirizine (ZYRTEC) 10 MG tablet Take 10 mg by mouth daily.    Marland Kitchen GARLIC PO Take 1 tablet by mouth daily.    Marland Kitchen levothyroxine (SYNTHROID, LEVOTHROID) 50 MCG tablet Take 50 mcg by mouth every morning.    Marland Kitchen lisinopril (PRINIVIL,ZESTRIL) 20 MG tablet Take 1 tablet (20 mg total) by mouth daily. 30 tablet 3  . magnesium gluconate (MAGONATE) 500 MG tablet Take 500 mg by mouth daily.    . Multiple Vitamins-Minerals (PRESERVISION AREDS 2) CAPS Take 2 capsules by mouth daily.     Marland Kitchen omeprazole (PRILOSEC) 20 MG capsule Take 20 mg by mouth every morning.     . potassium chloride (K-DUR) 10 MEQ tablet Take 20 mEq by mouth 2 (two) times daily.      No current facility-administered medications for this visit.     Discontinued Meds:    Medications Discontinued During This Encounter  Medication Reason  . torsemide (DEMADEX) 20 MG tablet Error    Patient Active Problem List   Diagnosis Date Noted  . Malnutrition of moderate degree 10/20/2015  . Elevated troponin I level 10/17/2015  . Hyponatremia 10/17/2015  . Anemia due to other cause 10/17/2015  . Hypothyroidism 10/17/2015  . Acute CHF (congestive heart failure) ([place]) 10/17/2015  . S/P CABG (coronary artery bypass graft)   . CAD (coronary artery disease) 09/22/2015  . Coronary artery disease involving native coronary artery of native heart without angina pectoris   . Postinfarction angina ([place])   . Non-STEMI (non-ST elevated myocardial infarction) ([place]) 09/18/2015  . Hypertension 09/18/2015  . GERD (gastroesophageal reflux disease) 09/18/2015  . NSTEMI (non-ST elevated myocardial infarction) ([place]) 09/18/2015  . ST elevation (STEMI) myocardial infarction of unspecified site ([place]) 09/18/2015  . ST elevation myocardial infarction (STEMI) ([place])   . Malignant neoplasm of prostate ([place]) 08/22/2011    LABS    Component Value Date/Time   NA 129 (L) 01/01/2016 0907   NA  134 (L) 10/30/2015 1421   NA 130 (L) 10/21/2015 0637   K 4.4 01/01/2016 0907   K 4.3 10/30/2015 1421   K 4.5 10/21/2015 0637   CL 90 (L) 01/01/2016 0907   CL 102 10/30/2015 1421   CL 90 (L) 10/21/2015 0637   CO2 29 01/01/2016 0907   CO2 27 10/30/2015 1421   CO2 35 (H) 10/21/2015 0637   GLUCOSE 115 (H) 01/01/2016 0907   GLUCOSE 99 10/30/2015 1421   GLUCOSE 109 (H) 10/21/2015 0637   BUN 30 (H) 01/01/2016 0907   BUN 15 10/30/2015 1421   BUN 14 10/21/2015 0637   CREATININE 1.98 (H) 01/01/2016 0907   CREATININE 1.08 10/30/2015 1421   CREATININE 0.82 10/21/2015 0637   CREATININE 0.84 10/20/2015 0415   CALCIUM 8.5 (L) 01/01/2016 0907   CALCIUM 8.2 (L) 10/30/2015 1421   CALCIUM 8.1 (L) 10/21/2015 0637    GFRNONAA >60 10/30/2015 1421   GFRNONAA >60 10/21/2015 0637   GFRNONAA >60 10/20/2015 0415   GFRAA >60 10/30/2015 1421   GFRAA >60 10/21/2015 0637   GFRAA >60 10/20/2015 0415   CMP     Component Value Date/Time   NA 129 (L) 01/01/2016 0907   K 4.4 01/01/2016 0907   CL 90 (L) 01/01/2016 0907   CO2 29 01/01/2016 0907   GLUCOSE 115 (H) 01/01/2016 0907   BUN 30 (H) 01/01/2016 0907   CREATININE 1.98 (H) 01/01/2016 0907   CALCIUM 8.5 (L) 01/01/2016 0907   PROT 7.0 10/17/2015 1236   ALBUMIN 3.6 10/17/2015 1236   AST 15 10/17/2015 1236   ALT 15 (L) 10/17/2015 1236   ALKPHOS 74 10/17/2015 1236   BILITOT 0.7 10/17/2015 1236   GFRNONAA >60 10/30/2015 1421   GFRAA >60 10/30/2015 1421       Component Value Date/Time   WBC 8.2 10/18/2015 0431   WBC 7.9 10/17/2015 1236   WBC 10.1 09/29/2015 0423   HGB 10.2 (L) 10/18/2015 0431   HGB 10.7 (L) 10/17/2015 1236   HGB 8.1 (L) 09/29/2015 0423   HCT 30.6 (L) 10/18/2015 0431   HCT 31.9 (L) 10/17/2015 1236   HCT 25.2 (L) 09/29/2015 0423   MCV 85.2 10/18/2015 0431   MCV 85.5 10/17/2015 1236   MCV 92.0 09/29/2015 0423    Lipid Panel     Component Value Date/Time   CHOL 164 09/18/2015 0339   TRIG 77 09/18/2015 0339   HDL 33 (L) 09/18/2015 0339   CHOLHDL 5.0 09/18/2015 0339   VLDL 15 09/18/2015 0339   LDLCALC 116 (H) 09/18/2015 0339    ABG    Component Value Date/Time   PHART 7.367 09/22/2015 2119   PCO2ART 40.3 09/22/2015 2119   PO2ART 72.0 (L) 09/22/2015 2119   HCO3 23.1 09/22/2015 2119   TCO2 27 09/25/2015 1658   ACIDBASEDEF 2.0 09/22/2015 2119   O2SAT 54.0 09/27/2015 0428     Lab Results  Component Value Date   TSH 4.86 (H) 01/01/2016   BNP (last 3 results)  Recent Labs  09/19/15 0312 09/20/15 0245 10/17/15 1236  BNP 356.6* 499.8* 1,809.0*    ProBNP (last 3 results) No results for input(s): PROBNP in the last 8760 hours.  Cardiac Panel (last 3 results) No results for input(s): CKTOTAL, CKMB, TROPONINI,  RELINDX in the last 72 hours.  Iron/TIBC/Ferritin/ %Sat No results found for: IRON, TIBC, FERRITIN, IRONPCTSAT   EKG Orders placed or performed in visit on 01/02/16  . EKG     Prior Assessment and Plan  Problem List as of 01/02/2016 Reviewed: 12/28/2015  7:28 PM by Carlyle Dolly, MD     Cardiovascular and Mediastinum   Non-STEMI (non-ST elevated myocardial infarction) ([place])   Hypertension   NSTEMI (non-ST elevated myocardial infarction) ([place])   ST elevation myocardial infarction (STEMI) ([place])   ST elevation (STEMI) myocardial infarction of unspecified site [facility])   Coronary artery disease involving native coronary artery of native heart without angina pectoris   Postinfarction angina (HCC)   CAD (coronary artery disease)   Acute CHF (congestive heart failure) (HCC)     Digestive   GERD (gastroesophageal reflux disease)     Endocrine   Hypothyroidism     Genitourinary   Malignant neoplasm of prostate ([place])     Other   S/P CABG (coronary artery bypass graft)   Elevated troponin I level   Hyponatremia   Anemia due to other cause   Malnutrition of moderate degree       Imaging: Dg Chest 2 View  Result Date: 12/06/2015 CLINICAL DATA:  CABG and TEE without inversion 09/22/2015. No current chest complaints. EXAM: CHEST  2 VIEW COMPARISON:  10/23/2015 FINDINGS: Lungs are adequately inflated with stable elevation of the right hemidiaphragm. No evidence of effusion or pneumothorax. Mild stable prominence of the perihilar markings. Cardiomediastinal silhouette and remainder of the exam is unchanged. IMPRESSION: Suggestion of minimal chronic vascular congestion. Electronically Signed   By: Marin Olp M.D.   On: 12/06/2015 10:57

## 2016-01-02 NOTE — Progress Notes (Signed)
Cardiology Office Note   Date:  01/02/2016   ID:  [name] [name], DOB 1928-08-05, MRN [id number]  PCP:  Maricela Curet, MD  Cardiologist: Cloria Spring, NP   Chief Complaint  Patient presents with  . Coronary Artery Disease  . Congestive Heart Failure      History of Present Illness: [name] [name] is a 82 y.o. male who presents for ongoing assessment and management of coronary artery disease, multivessel per cardiac catheterization requiring coronary artery bypass grafting with LIMA to LAD, SVG to distal RCA, SVG to acute marginal. The patient did go into cardiogenic shock during procedures. Postoperatively he had atrial fibrillation, but was not started on anticoagulation due to multiple comorbidities and age. Patient also was recently admitted in July 2017 with acute on chronic CHF with EF of [id number] grade 2 diastolic dysfunction.  He was last seen by Dr. Harl Bowie on 12/26/2015, he was found to have did weight gain with edema and worsening heart failure, that was not improved with increasing dose of Lasix. He was changed to torsemide 20 mg twice a day, he was to have a BMET magnesium and TSH checked and follow-up.  He is here today on follow-up after being told to stop torsemide due to abnormal labs. BMET was completed on 01/01/2016 sodium 129 potassium 4.4 chloride 90, CO2 29, BUN 30, creatinine 1.98, TSH 4.86, T3 70.0, magnesium 1.8. He continues to have dyspnea on exertion, lower extremity edema, his weight is down 5 pounds.  He states he was seen by his primary care physician, Dr. Lorriane Shire, where an echocardiogram was completed. He states they were told that his ejection fraction was 20-25%. I do not have a copy of that echo to evaluate accuracy. We have requested this report.  Past Medical History:  Diagnosis Date  . Bradycardia    a. 2/2 amio after CABG 2017  . CAD in native artery    a. NSTEMI/near-STEMI equivalent 08/2015 s/p CABG.  . Chronic combined systolic  and diastolic CHF (congestive heart failure) ([place])   . DJD (degenerative joint disease)   . First degree heart block   . GERD (gastroesophageal reflux disease)   . H/O hiatal hernia   . History of cancer of larynx 1996--- S/P  REMOVAL 1/3 VOICE BOX  AND RADIATION TX   NO RECURRENCE---  RESIDUAL , PT SPEECH A WHISPER  . Hypertension   . Hypertensive heart disease   . Hyponatremia   . Hypothyroidism   . Non-STEMI (non-ST elevated myocardial infarction) ([place]) 09/18/2015  . Paroxysmal atrial fibrillation ([facility])    a. post op CABG 2017 -> brady/n/v on amiodarone; not anticoagulated due to advanced age per notes.  . Prostate cancer ([facility]) STAGE T2a,     FOLLOWED BY DR Diona Fanti AND DR MANNING  . S/P CABG (coronary artery bypass graft)   . Weakness of voice PT CAN ONLY WHISPER---  SECONDARY TO VOCAL CORD CA  S/P REMOVAL 1/3  VOICE BOX    Past Surgical History:  Procedure Laterality Date  . CARDIAC CATHETERIZATION N/A 09/18/2015   Procedure: Left Heart Cath and Coronary Angiography;  Surgeon: Troy Sine, MD;  Location: [facility];  Service: Cardiovascular;  Laterality: N/A;  . CARDIAC CATHETERIZATION N/A 09/21/2015   Procedure: IABP Insertion;  Surgeon: Leonie Man, MD;  Location: [facility];  Service: Cardiovascular;  Laterality: N/A;  . CARDIOVASCULAR STRESS TEST  5 YRS AGO   PT STATES NORMAL  . CATARACT EXTRACTION  W/ INTRAOCULAR LENS  IMPLANT, BILATERAL    . CORONARY ARTERY BYPASS GRAFT N/A 09/22/2015   Procedure: CORONARY ARTERY BYPASS GRAFTING (CABG) TIMES  THREE  USING LEFT INTERNAL MAMMARY ARTERY AND RIGHT SAPHENOUS VEIN HARVESTED ENDOSCOPICALLY, CORONARY ENDARTERECTOMY;  Surgeon: Ivin Poot, MD;  Location: [facility];  Service: Open Heart Surgery;  Laterality: N/A;  . CYSTOSCOPY  08/02/2011   Procedure: CYSTOSCOPY FLEXIBLE;  Surgeon: Franchot Gallo, MD;  Location: [facility];  Service: Urology;  Laterality: N/A;  . LARYNX SURGERY  x3  1996   BX'S  AND 1/3 OF VOICE BOX REMOVED DUE TO CANCER--  NO RECURRENCE---  (RESIDUAL , WHISPERS)  . RADIOACTIVE SEED IMPLANT  08/02/2011   Procedure: RADIOACTIVE SEED IMPLANT;  Surgeon: Franchot Gallo, MD;  Location: [facility];  Service: Urology;  Laterality: N/A;  C-ARM RAD TECH OK PER [facility] AT MAIN  . TEE WITHOUT CARDIOVERSION N/A 09/22/2015   Procedure: TRANSESOPHAGEAL ECHOCARDIOGRAM (TEE);  Surgeon: Ivin Poot, MD;  Location: [facility];  Service: Open Heart Surgery;  Laterality: N/A;     Current Outpatient Prescriptions  Medication Sig Dispense Refill  . albuterol (PROVENTIL) (2.5 MG/3ML) 0.083% nebulizer solution Take 2.5 mg by nebulization 3 (three) times daily.    Marland Kitchen aspirin EC 81 MG EC tablet Take 1 tablet (81 mg total) by mouth daily.    Marland Kitchen atorvastatin (LIPITOR) 80 MG tablet Take 1 tablet (80 mg total) by mouth daily at 6 PM. 30 tablet 3  . Bilberry, Vaccinium myrtillus, (BILBERRY PO) Take 2 tablets by mouth daily.     . carvedilol (COREG) 3.125 MG tablet Take 3.125 mg by mouth 2 (two) times daily with a meal.    . cetirizine (ZYRTEC) 10 MG tablet Take 10 mg by mouth daily.    Marland Kitchen GARLIC PO Take 1 tablet by mouth daily.    Marland Kitchen levothyroxine (SYNTHROID, LEVOTHROID) 50 MCG tablet Take 50 mcg by mouth every morning.    Marland Kitchen lisinopril (PRINIVIL,ZESTRIL) 20 MG tablet Take 1 tablet (20 mg total) by mouth daily. 30 tablet 3  . magnesium gluconate (MAGONATE) 500 MG tablet Take 500 mg by mouth daily.    . Multiple Vitamins-Minerals (PRESERVISION AREDS 2) CAPS Take 2 capsules by mouth daily.     Marland Kitchen omeprazole (PRILOSEC) 20 MG capsule Take 20 mg by mouth every morning.     . potassium chloride (K-DUR) 10 MEQ tablet Take 20 mEq by mouth 2 (two) times daily.    Marland Kitchen torsemide (DEMADEX) 20 MG tablet Take 1 tablet (20 mg total) by mouth daily. 90 tablet 3   No current facility-administered medications for this visit.     Allergies:   Sulfa antibiotics and Contrast media [iodinated diagnostic  agents]    Social History:  The patient  reports that he quit smoking about 25 years ago. His smoking use included Cigarettes. He has a 135.00 pack-year smoking history. He has never used smokeless tobacco. He reports that he does not drink alcohol or use drugs.   Family History:  The patient's family history includes Congestive Heart Failure in his mother; Stroke in his father.    ROS: All other systems are reviewed and negative. Unless otherwise mentioned in H&P    PHYSICAL EXAM: VS:  BP 122/78   Pulse 62   Ht 5\' 10"  (1.778 m)   Wt 182 lb (82.6 kg)   SpO2 91%   BMI 26.11 kg/m  , BMI Body mass index is 26.11 kg/m. GEN: Well  nourished, well developed, in no acute distress  HEENT: normal  Neck: no JVD, carotid bruits, or masses Cardiac: RRR; 1/6 systolic murmur, no murmurs, rubs, or gallops Respiratory: Bibasilar crackles without wheezes or coughing. GI: soft, nontender, nondistended, + BS MS: no deformity or atrophy 2+ pitting edema to his knees. Skin: warm and dry, no rash Neuro:  Strength and sensation are intact Psych: euthymic mood, full affect   Recent Labs: 10/17/2015: ALT 15; B Natriuretic Peptide 1,809.0 10/18/2015: Hemoglobin 10.2; Platelets 305 01/01/2016: BUN 30; Creat 1.98; Magnesium 1.8; Potassium 4.4; Sodium 129; TSH 4.86    Lipid Panel    Component Value Date/Time   CHOL 164 09/18/2015 0339   TRIG 77 09/18/2015 0339   HDL 33 (L) 09/18/2015 0339   CHOLHDL 5.0 09/18/2015 0339   VLDL 15 09/18/2015 0339   LDLCALC 116 (H) 09/18/2015 0339      Wt Readings from Last 3 Encounters:  01/02/16 182 lb (82.6 kg)  12/26/15 188 lb (85.3 kg)  12/06/15 166 lb (75.3 kg)      ASSESSMENT AND PLAN:  1.  Coronary artery disease: Status post coronary artery bypass grafting,  LIMA to LAD, SVG to distal RCA, SVG to acute marginal. He has reduced ejection fraction most noted at 40-45% however he did have a follow-up echocardiogram completed by his primary care physician  Dr. Lorriane Shire with significantly reduced ejection fraction. A requesting this report. In the interim he will continue carvedilol 3.125 mg twice a day lisinopril 20 mg daily.  2. Chronic combined CHF: He was given extra doses of torsemide, 20 mg twice a day with follow-up labs indicating worsening creatinine levels. Increasing from 1.0-1.9. Torsemide was held for 24 hours. I will restarted at 20 mg a day with a follow-up BMET in 3 days. A chest x-ray will be completed today as his breathing status appears to be slightly labored with an O2 sat of 91% at rest.   3. Hypothyroidism: TSH levels are slightly elevated. These are being followed by primary care.   Current medicines are reviewed at length with the patient today.  Labs/ tests ordered today include:   Orders Placed This Encounter  Procedures  . DG Chest 2 View     Disposition:   FU with 3 days for BMET, one week as an add-on   Signed, Jory Sims, NP  01/02/2016 5:03 PM    [facility] [address] [place], [place] [address] Phone: [phone]; Fax: [phone]

## 2016-01-03 ENCOUNTER — Encounter: Payer: Self-pay | Admitting: *Deleted

## 2016-01-04 ENCOUNTER — Other Ambulatory Visit: Payer: Self-pay | Admitting: Adult Health

## 2016-01-04 ENCOUNTER — Other Ambulatory Visit: Payer: Self-pay

## 2016-01-04 ENCOUNTER — Ambulatory Visit (HOSPITAL_COMMUNITY)
Admission: RE | Admit: 2016-01-04 | Discharge: 2016-01-04 | Disposition: A | Discharge status: Home / Self Care | Payer: Medicare HMO | Source: Ambulatory Visit | Attending: Adult Health | Admitting: Adult Health

## 2016-01-04 ENCOUNTER — Telehealth: Payer: Self-pay

## 2016-01-04 DIAGNOSIS — R9389 Abnormal findings on diagnostic imaging of other specified body structures: Secondary | ICD-10-CM

## 2016-01-04 DIAGNOSIS — I712 Thoracic aortic aneurysm, without rupture: Secondary | ICD-10-CM | POA: Diagnosis not present

## 2016-01-04 DIAGNOSIS — R918 Other nonspecific abnormal finding of lung field: Secondary | ICD-10-CM | POA: Insufficient documentation

## 2016-01-04 DIAGNOSIS — J90 Pleural effusion, not elsewhere classified: Secondary | ICD-10-CM | POA: Insufficient documentation

## 2016-01-04 DIAGNOSIS — R938 Abnormal findings on diagnostic imaging of other specified body structures: Secondary | ICD-10-CM | POA: Diagnosis present

## 2016-01-04 NOTE — Telephone Encounter (Signed)
I spoke with wife,pt will come now to radiology

## 2016-01-04 NOTE — Telephone Encounter (Signed)
With contrast order placed

## 2016-01-04 NOTE — Telephone Encounter (Signed)
-----   Message from Lendon Colonel, NP sent at 01/04/2016  7:11 AM EDT ----- Please send patient for CT scan of the chest secondary to abnormal CXR reflecting prior bleed. This needs to be done today. Send copy of CXR to Dr. Zada Finders, and copy of CT results to dr.Van Tright. Please order CBC.

## 2016-01-05 ENCOUNTER — Telehealth: Payer: Self-pay | Admitting: Adult Health

## 2016-01-05 NOTE — Telephone Encounter (Signed)
I did not request steroids. I told him to discuss this with PCP to decide if this should be continued or not. Cannot refill medication I did not prescribe.

## 2016-01-05 NOTE — Telephone Encounter (Signed)
Family notified

## 2016-01-05 NOTE — Telephone Encounter (Signed)
Will forward to NP for advice.

## 2016-01-05 NOTE — Telephone Encounter (Signed)
Please give the pt's daughter a call--pt was unable to see PCP to get Rx for a steroid per KL's request from last visit...please give daughter Normajean Glasgow a call at [phone]

## 2016-01-06 LAB — BASIC METABOLIC PANEL
BUN: 27 mg/dL — ABNORMAL HIGH (ref 7–25)
CALCIUM: 9 mg/dL (ref 8.6–10.3)
CHLORIDE: 93 mmol/L — AB (ref 98–110)
CO2: 30 mmol/L (ref 20–31)
Creat: 1.78 mg/dL — ABNORMAL HIGH (ref 0.70–1.11)
GLUCOSE: 90 mg/dL (ref 65–99)
POTASSIUM: 4.7 mmol/L (ref 3.5–5.3)
SODIUM: 131 mmol/L — AB (ref 135–146)

## 2016-01-06 LAB — MAGNESIUM: Magnesium: 2.2 mg/dL (ref 1.5–2.5)

## 2016-01-08 ENCOUNTER — Telehealth: Payer: Self-pay

## 2016-01-08 DIAGNOSIS — Z79899 Other long term (current) drug therapy: Secondary | ICD-10-CM

## 2016-01-08 MED ORDER — TORSEMIDE 20 MG PO TABS: 20.0000 mg | ORAL_TABLET | ORAL | 3 refills | Status: AC

## 2016-01-08 NOTE — Telephone Encounter (Signed)
-----   Message from Arnoldo Lenis, MD sent at 01/08/2016  1:00 PM EDT ----- Blood work is improving but not back to normal. Vefify he is taking torsemide 20mg  daily and change to every other day. Repeat BMET/Mg in 2 weeks.   Zandra Abts MD

## 2016-01-08 NOTE — Telephone Encounter (Signed)
Pt will decrease demadex to every other day and have labs done in 2 weeks

## 2016-01-09 ENCOUNTER — Ambulatory Visit (INDEPENDENT_AMBULATORY_CARE_PROVIDER_SITE_OTHER): Payer: Medicare HMO | Admitting: Adult Health

## 2016-01-09 ENCOUNTER — Encounter: Payer: Self-pay | Admitting: Adult Health

## 2016-01-09 VITALS — BP 122/66 | HR 67 | Ht 70.0 in | Wt 182.0 lb

## 2016-01-09 DIAGNOSIS — I2510 Atherosclerotic heart disease of native coronary artery without angina pectoris: Secondary | ICD-10-CM | POA: Diagnosis not present

## 2016-01-09 DIAGNOSIS — Z8701 Personal history of pneumonia (recurrent): Secondary | ICD-10-CM | POA: Diagnosis not present

## 2016-01-09 DIAGNOSIS — I712 Thoracic aortic aneurysm, without rupture, unspecified: Secondary | ICD-10-CM

## 2016-01-09 DIAGNOSIS — J410 Simple chronic bronchitis: Secondary | ICD-10-CM | POA: Diagnosis not present

## 2016-01-09 MED ORDER — AZITHROMYCIN 250 MG PO TABS: ORAL_TABLET | ORAL | 0 refills | Status: DC

## 2016-01-09 NOTE — Progress Notes (Signed)
Cardiology Office Note   Date:  01/09/2016   ID:  [name] [name], DOB Aug 01, 1928, MRN [id number]  PCP:  Maricela Curet, MD  Cardiologist: Cloria Spring, NP   Chief Complaint  Patient presents with  . Coronary Artery Disease  . Congestive Heart Failure      History of Present Illness: [name] [name] is a 82 y.o. male who presents for close follow-up, for assessment and management of coronary artery disease, status post coronary artery bypass grafting (LIMA to LAD, SVG to distal RCA, SVG to acute marginal.) The patient had evidence of fluid overload when seen on follow-up by Dr. Harl Bowie on 12/26/2015 with weight gain and worsening heart failure. The patient was changed to torsemide 20 mg twice a day with a follow-up BMET and magnesium.  The follow-up labs revealed that the patient was hyponatremic with a sodium of 129 and a creatinine of 1.98. Torsemide was discontinued. He continued to have dyspnea on exertion and some mild chest discomfort. Most recent echocardiogram during hospitalization revealed any F of 40-45%, but was seen by primary care physician, Dr. Lorriane Shire, and was reportedly told that his EF was down to 25%. We have requested that report. A follow-up chest x-ray was ordered.  Chest x-ray was significantly abnormal, indicating a prior bleed. A follow-up CT scan was ordered for reevaluation. Demonstrating 4.8 cm ascending thoracic aortic aneurysm is noted. Ascending thoracic aortic aneurysm, and a moderate right pleural effusion.   This was discussed with Dr. Johnsie Cancel on site, as this was a new finding. Also copy of the CT scan results were sent to his primary care physician and to Dr.Van Tright. Dr.Nishan did not feel that the aneurysm was a new finding although it was not reported on previous CT scan. The patient was to follow-up with Dr Lorriane Shire primary care for ongoing treatment of dyspnea.  Earlier today I spoke with Dr. Harl Bowie concerning this patient's recent  diagnosis of thoracic aortic aneurysm. Dr. Lorriane Shire also spoke with me about this gentleman. Has been decided that he will need to have a steroid Dosepak.  Past Medical History:  Diagnosis Date  . Bradycardia    a. 2/2 amio after CABG 2017  . CAD in native artery    a. NSTEMI/near-STEMI equivalent 08/2015 s/p CABG.  . Chronic combined systolic and diastolic CHF (congestive heart failure) ([place])   . DJD (degenerative joint disease)   . First degree heart block   . GERD (gastroesophageal reflux disease)   . H/O hiatal hernia   . History of cancer of larynx 1996--- S/P  REMOVAL 1/3 VOICE BOX  AND RADIATION TX   NO RECURRENCE---  RESIDUAL , PT SPEECH A WHISPER  . Hypertension   . Hypertensive heart disease   . Hyponatremia   . Hypothyroidism   . Non-STEMI (non-ST elevated myocardial infarction) ([place]) 09/18/2015  . Paroxysmal atrial fibrillation ([facility])    a. post op CABG 2017 -> brady/n/v on amiodarone; not anticoagulated due to advanced age per notes.  . Prostate cancer ([facility]) STAGE T2a,     FOLLOWED BY DR Diona Fanti AND DR MANNING  . S/P CABG (coronary artery bypass graft)   . Weakness of voice PT CAN ONLY WHISPER---  SECONDARY TO VOCAL CORD CA  S/P REMOVAL 1/3  VOICE BOX    Past Surgical History:  Procedure Laterality Date  . CARDIAC CATHETERIZATION N/A 09/18/2015   Procedure: Left Heart Cath and Coronary Angiography;  Surgeon: Troy Sine, MD;  Location:  [facility];  Service: Cardiovascular;  Laterality: N/A;  . CARDIAC CATHETERIZATION N/A 09/21/2015   Procedure: IABP Insertion;  Surgeon: Leonie Man, MD;  Location: [facility];  Service: Cardiovascular;  Laterality: N/A;  . CARDIOVASCULAR STRESS TEST  5 YRS AGO   PT STATES NORMAL  . CATARACT EXTRACTION W/ INTRAOCULAR LENS  IMPLANT, BILATERAL    . CORONARY ARTERY BYPASS GRAFT N/A 09/22/2015   Procedure: CORONARY ARTERY BYPASS GRAFTING (CABG) TIMES  THREE  USING LEFT INTERNAL MAMMARY ARTERY AND RIGHT SAPHENOUS VEIN  HARVESTED ENDOSCOPICALLY, CORONARY ENDARTERECTOMY;  Surgeon: Ivin Poot, MD;  Location: [facility];  Service: Open Heart Surgery;  Laterality: N/A;  . CYSTOSCOPY  08/02/2011   Procedure: CYSTOSCOPY FLEXIBLE;  Surgeon: Franchot Gallo, MD;  Location: [facility];  Service: Urology;  Laterality: N/A;  . LARYNX SURGERY  x3  1996   BX'S AND 1/3 OF VOICE BOX REMOVED DUE TO CANCER--  NO RECURRENCE---  (RESIDUAL , WHISPERS)  . RADIOACTIVE SEED IMPLANT  08/02/2011   Procedure: RADIOACTIVE SEED IMPLANT;  Surgeon: Franchot Gallo, MD;  Location: [facility];  Service: Urology;  Laterality: N/A;  C-ARM RAD TECH OK PER [facility] AT MAIN  . TEE WITHOUT CARDIOVERSION N/A 09/22/2015   Procedure: TRANSESOPHAGEAL ECHOCARDIOGRAM (TEE);  Surgeon: Ivin Poot, MD;  Location: [facility];  Service: Open Heart Surgery;  Laterality: N/A;     Current Outpatient Prescriptions  Medication Sig Dispense Refill  . albuterol (PROVENTIL) (2.5 MG/3ML) 0.083% nebulizer solution Take 2.5 mg by nebulization 3 (three) times daily.    Marland Kitchen aspirin EC 81 MG EC tablet Take 1 tablet (81 mg total) by mouth daily.    Marland Kitchen atorvastatin (LIPITOR) 80 MG tablet Take 1 tablet (80 mg total) by mouth daily at 6 PM. 30 tablet 3  . Bilberry, Vaccinium myrtillus, (BILBERRY PO) Take 2 tablets by mouth daily.     . carvedilol (COREG) 3.125 MG tablet Take 3.125 mg by mouth 2 (two) times daily with a meal.    . cetirizine (ZYRTEC) 10 MG tablet Take 10 mg by mouth daily.    Marland Kitchen GARLIC PO Take 1 tablet by mouth daily.    Marland Kitchen levothyroxine (SYNTHROID, LEVOTHROID) 50 MCG tablet Take 50 mcg by mouth every morning.    Marland Kitchen lisinopril (PRINIVIL,ZESTRIL) 20 MG tablet Take 1 tablet (20 mg total) by mouth daily. 30 tablet 3  . magnesium gluconate (MAGONATE) 500 MG tablet Take 500 mg by mouth daily.    . Multiple Vitamins-Minerals (PRESERVISION AREDS 2) CAPS Take 2 capsules by mouth daily.     Marland Kitchen omeprazole (PRILOSEC) 20 MG capsule Take 20 mg  by mouth every morning.     . potassium chloride (K-DUR) 10 MEQ tablet Take 20 mEq by mouth 2 (two) times daily.    Marland Kitchen torsemide (DEMADEX) 20 MG tablet Take 1 tablet (20 mg total) by mouth every other day. 45 tablet 3  . azithromycin (ZITHROMAX Z-PAK) 250 MG tablet TAKE 2 TABLETS ON DAY ONE. ON DAYS 2-5 TAKE ONE TABLET DAILY. 6 each 0   No current facility-administered medications for this visit.     Allergies:   Sulfa antibiotics and Contrast media [iodinated diagnostic agents]    Social History:  The patient  reports that he quit smoking about 25 years ago. His smoking use included Cigarettes. He has a 135.00 pack-year smoking history. He has never used smokeless tobacco. He reports that he does not drink alcohol or use drugs.  Family History:  The patient's family history includes Congestive Heart Failure in his mother; Stroke in his father.    ROS: All other systems are reviewed and negative. Unless otherwise mentioned in H&P    PHYSICAL EXAM: VS:  BP 122/66   Pulse 67   Ht 5\' 10"  (1.778 m)   Wt 182 lb (82.6 kg)   SpO2 99%   BMI 26.11 kg/m  , BMI Body mass index is 26.11 kg/m. GEN: Well nourished, well developed, in no acute distress  HEENT: normal  Neck: no JVD, carotid bruits, or masses Cardiac: RRR; no murmurs, rubs, or gallops,no edema  Respiratory:  Inspiratory/ expiratory wheezes, bilateral crackles. GI: soft, nontender, nondistended, + BS MS: no deformity or atrophy  Skin: warm and dry, no rash Neuro:  Strength and sensation are intact Psych: euthymic mood, full affect   Recent Labs: 10/17/2015: ALT 15; B Natriuretic Peptide 1,809.0 10/18/2015: Hemoglobin 10.2; Platelets 305 01/01/2016: TSH 4.86 01/05/2016: BUN 27; Creat 1.78; Magnesium 2.2; Potassium 4.7; Sodium 131    Lipid Panel    Component Value Date/Time   CHOL 164 09/18/2015 0339   TRIG 77 09/18/2015 0339   HDL 33 (L) 09/18/2015 0339   CHOLHDL 5.0 09/18/2015 0339   VLDL 15 09/18/2015 0339    LDLCALC 116 (H) 09/18/2015 0339      Wt Readings from Last 3 Encounters:  01/09/16 182 lb (82.6 kg)  01/02/16 182 lb (82.6 kg)  12/26/15 188 lb (85.3 kg)    ASSESSMENT AND PLAN:  1. Coronary artery disease: Status post coronary artery bypass grafting. The patient is a complicated post hospitalization course with pleural effusion and CHF and ongoing shortness of breath. The patient has been attended to by myself and by Dr. Harl Bowie with medication changes. He will continue aspirin 81 mg daily atorvastatin daily carvedilol 3.125 mg twice a day, lisinopril 20 mg daily.  2. Acute on chronic systolic heart failure: After evaluation of recent labs, torsemide will be given 20 mg every other day. He will continue potassium 20 mg daily along with his torsemide dose. He will have follow-up labs drawn by Dr. Lorriane Shire.  3. COPD: After discussion with Dr. Lorriane Shire, the patient will be placed on a steroid Dosepak 20 mg over a week. He will also be started on a Z-Pak five-day regimen. He will follow up with Dr. Lorriane Shire for ongoing management and assessment of progress.  4. Thoracic aortic aneurysm: Measured at 4.8 cm. This will be followed annually.   Current medicines are reviewed at length with the patient today.    Labs/ tests ordered today include:   Orders Placed This Encounter  Procedures  . DG Chest 2 View     Disposition:   FU with Dr. Harl Bowie in 3 months.  Signed, Jory Sims, NP  01/09/2016 4:56 PM    [facility] [address], [place], [place] [address] Phone: [phone]; Fax: [phone]

## 2016-01-09 NOTE — Progress Notes (Signed)
Name: [name] [name]    DOB: [birth year]  Age: 82 y.o.  MR#: [id number]       PCP:  Maricela Curet, MD      Insurance: Payor: [employer] MEDICARE / Plan: [employer] MEDICARE HMO/PPO / Product Type: *No Product type* /   CC:   No chief complaint on file.   VS Vitals:   01/09/16 1606  Weight: 182 lb (82.6 kg)  Height: 5\' 10"  (1.778 m)    Weights Current Weight  01/09/16 182 lb (82.6 kg)  01/02/16 182 lb (82.6 kg)  12/26/15 188 lb (85.3 kg)    Blood Pressure  BP Readings from Last 3 Encounters:  01/02/16 122/78  12/26/15 118/70  12/06/15 140/82     Admit date:  (Not on file) Last encounter with RMR:  01/05/2016   Allergy Sulfa antibiotics and Contrast media [iodinated diagnostic agents]  Current Outpatient Prescriptions  Medication Sig Dispense Refill  . albuterol (PROVENTIL) (2.5 MG/3ML) 0.083% nebulizer solution Take 2.5 mg by nebulization 3 (three) times daily.    Marland Kitchen aspirin EC 81 MG EC tablet Take 1 tablet (81 mg total) by mouth daily.    Marland Kitchen atorvastatin (LIPITOR) 80 MG tablet Take 1 tablet (80 mg total) by mouth daily at 6 PM. 30 tablet 3  . Bilberry, Vaccinium myrtillus, (BILBERRY PO) Take 2 tablets by mouth daily.     . carvedilol (COREG) 3.125 MG tablet Take 3.125 mg by mouth 2 (two) times daily with a meal.    . cetirizine (ZYRTEC) 10 MG tablet Take 10 mg by mouth daily.    Marland Kitchen GARLIC PO Take 1 tablet by mouth daily.    Marland Kitchen levothyroxine (SYNTHROID, LEVOTHROID) 50 MCG tablet Take 50 mcg by mouth every morning.    Marland Kitchen lisinopril (PRINIVIL,ZESTRIL) 20 MG tablet Take 1 tablet (20 mg total) by mouth daily. 30 tablet 3  . magnesium gluconate (MAGONATE) 500 MG tablet Take 500 mg by mouth daily.    . Multiple Vitamins-Minerals (PRESERVISION AREDS 2) CAPS Take 2 capsules by mouth daily.     Marland Kitchen omeprazole (PRILOSEC) 20 MG capsule Take 20 mg by mouth every morning.     . potassium chloride (K-DUR) 10 MEQ tablet Take 20 mEq by mouth 2 (two) times daily.    Marland Kitchen torsemide (DEMADEX) 20 MG tablet  Take 1 tablet (20 mg total) by mouth every other day. 45 tablet 3   No current facility-administered medications for this visit.     Discontinued Meds:   There are no discontinued medications.  Patient Active Problem List   Diagnosis Date Noted  . Malnutrition of moderate degree 10/20/2015  . Elevated troponin I level 10/17/2015  . Hyponatremia 10/17/2015  . Anemia due to other cause 10/17/2015  . Hypothyroidism 10/17/2015  . Acute CHF (congestive heart failure) ([place]) 10/17/2015  . S/P CABG (coronary artery bypass graft)   . CAD (coronary artery disease) 09/22/2015  . Coronary artery disease involving native coronary artery of native heart without angina pectoris   . Postinfarction angina ([place])   . Non-STEMI (non-ST elevated myocardial infarction) ([place]) 09/18/2015  . Hypertension 09/18/2015  . GERD (gastroesophageal reflux disease) 09/18/2015  . NSTEMI (non-ST elevated myocardial infarction) ([place]) 09/18/2015  . ST elevation (STEMI) myocardial infarction of unspecified site ([place]) 09/18/2015  . ST elevation myocardial infarction (STEMI) ([place])   . Malignant neoplasm of prostate ([place]) 08/22/2011    LABS    Component Value Date/Time   NA 131 (L) 01/05/2016 1012   NA 129 (L)  01/01/2016 0907   NA 134 (L) 10/30/2015 1421   K 4.7 01/05/2016 1012   K 4.4 01/01/2016 0907   K 4.3 10/30/2015 1421   CL 93 (L) 01/05/2016 1012   CL 90 (L) 01/01/2016 0907   CL 102 10/30/2015 1421   CO2 30 01/05/2016 1012   CO2 29 01/01/2016 0907   CO2 27 10/30/2015 1421   GLUCOSE 90 01/05/2016 1012   GLUCOSE 115 (H) 01/01/2016 0907   GLUCOSE 99 10/30/2015 1421   BUN 27 (H) 01/05/2016 1012   BUN 30 (H) 01/01/2016 0907   BUN 15 10/30/2015 1421   CREATININE 1.78 (H) 01/05/2016 1012   CREATININE 1.98 (H) 01/01/2016 0907   CREATININE 1.08 10/30/2015 1421   CREATININE 0.82 10/21/2015 0637   CREATININE 0.84 10/20/2015 0415   CALCIUM 9.0 01/05/2016 1012   CALCIUM 8.5 (L) 01/01/2016 0907   CALCIUM 8.2  (L) 10/30/2015 1421   GFRNONAA >60 10/30/2015 1421   GFRNONAA >60 10/21/2015 0637   GFRNONAA >60 10/20/2015 0415   GFRAA >60 10/30/2015 1421   GFRAA >60 10/21/2015 0637   GFRAA >60 10/20/2015 0415   CMP     Component Value Date/Time   NA 131 (L) 01/05/2016 1012   K 4.7 01/05/2016 1012   CL 93 (L) 01/05/2016 1012   CO2 30 01/05/2016 1012   GLUCOSE 90 01/05/2016 1012   BUN 27 (H) 01/05/2016 1012   CREATININE 1.78 (H) 01/05/2016 1012   CALCIUM 9.0 01/05/2016 1012   PROT 7.0 10/17/2015 1236   ALBUMIN 3.6 10/17/2015 1236   AST 15 10/17/2015 1236   ALT 15 (L) 10/17/2015 1236   ALKPHOS 74 10/17/2015 1236   BILITOT 0.7 10/17/2015 1236   GFRNONAA >60 10/30/2015 1421   GFRAA >60 10/30/2015 1421       Component Value Date/Time   WBC 8.2 10/18/2015 0431   WBC 7.9 10/17/2015 1236   WBC 10.1 09/29/2015 0423   HGB 10.2 (L) 10/18/2015 0431   HGB 10.7 (L) 10/17/2015 1236   HGB 8.1 (L) 09/29/2015 0423   HCT 30.6 (L) 10/18/2015 0431   HCT 31.9 (L) 10/17/2015 1236   HCT 25.2 (L) 09/29/2015 0423   MCV 85.2 10/18/2015 0431   MCV 85.5 10/17/2015 1236   MCV 92.0 09/29/2015 0423    Lipid Panel     Component Value Date/Time   CHOL 164 09/18/2015 0339   TRIG 77 09/18/2015 0339   HDL 33 (L) 09/18/2015 0339   CHOLHDL 5.0 09/18/2015 0339   VLDL 15 09/18/2015 0339   LDLCALC 116 (H) 09/18/2015 0339    ABG    Component Value Date/Time   PHART 7.367 09/22/2015 2119   PCO2ART 40.3 09/22/2015 2119   PO2ART 72.0 (L) 09/22/2015 2119   HCO3 23.1 09/22/2015 2119   TCO2 27 09/25/2015 1658   ACIDBASEDEF 2.0 09/22/2015 2119   O2SAT 54.0 09/27/2015 0428     Lab Results  Component Value Date   TSH 4.86 (H) 01/01/2016   BNP (last 3 results)  Recent Labs  09/19/15 0312 09/20/15 0245 10/17/15 1236  BNP 356.6* 499.8* 1,809.0*    ProBNP (last 3 results) No results for input(s): PROBNP in the last 8760 hours.  Cardiac Panel (last 3 results) No results for input(s): CKTOTAL, CKMB,  TROPONINI, RELINDX in the last 72 hours.  Iron/TIBC/Ferritin/ %Sat No results found for: IRON, TIBC, FERRITIN, IRONPCTSAT   EKG Orders placed or performed in visit on 01/02/16  . EKG     Prior Assessment and  Plan Problem List as of 01/09/2016 Reviewed: 01/02/2016  5:13 PM by Jory Sims, NP     Cardiovascular and Mediastinum   Non-STEMI (non-ST elevated myocardial infarction) [facility])   Hypertension   NSTEMI (non-ST elevated myocardial infarction) ([place])   ST elevation myocardial infarction (STEMI) ([place])   ST elevation (STEMI) myocardial infarction of unspecified site [facility])   Coronary artery disease involving native coronary artery of native heart without angina pectoris   Postinfarction angina (HCC)   CAD (coronary artery disease)   Acute CHF (congestive heart failure) (HCC)     Digestive   GERD (gastroesophageal reflux disease)     Endocrine   Hypothyroidism     Genitourinary   Malignant neoplasm of prostate ([place])     Other   S/P CABG (coronary artery bypass graft)   Elevated troponin I level   Hyponatremia   Anemia due to other cause   Malnutrition of moderate degree       Imaging: Dg Chest 2 View  Result Date: 01/03/2016 CLINICAL DATA:  Worsening shortness of breath for the past several weeks. Intermittent episodes since CABG on September 22, 2015. The patient denies chest pain. Patient is a former smoker. EXAM: CHEST  2 VIEW COMPARISON:  PA and lateral chest x-ray of December 06, 2015 FINDINGS: The right hemidiaphragm remains elevated. The interstitial markings of both lungs remain increased but are slightly less conspicuous today. There is no alveolar infiltrate. Small amounts of pleural thickening versus loculated pleural fluid are present laterally at both lung bases. There is slightly increased soft tissue density in the retrosternal region especially inferior to bleed today as compared to the previous study. The cardiac silhouette remains enlarged. The pulmonary  vascularity is prominent centrally but less so than on the previous study. The sternal wires are intact. IMPRESSION: Slight increased prominence of retrosternal soft tissues which may be in part projectional but a true change may be present possibly reflecting late hemorrhage. Chest CT scanning is recommended. Chronic bronchitic changes. Mild central pulmonary vascular prominence improved since the previous study. Stable cardiomegaly. Chronic elevation of the right hemidiaphragm. Aortic atherosclerosis. Electronically Signed   By: David  Martinique M.D.   On: 01/03/2016 08:35   Ct Chest Wo Contrast  Result Date: 01/04/2016 CLINICAL DATA:  Abnormal chest x-ray. EXAM: CT CHEST WITHOUT CONTRAST TECHNIQUE: Multidetector CT imaging of the chest was performed following the standard protocol without IV contrast. COMPARISON:  Radiographs of January 02, 2016. CT scan of September 19, 2015. FINDINGS: Cardiovascular: Atherosclerosis of thoracic aorta is noted. 4.8 cm ascending thoracic aortic aneurysm is noted. Status post coronary artery bypass graft. Mediastinum/Nodes: Stable mildly enlarged adenopathy is noted which most likely is reactive in etiology. Lungs/Pleura: Moderate right pleural effusion is noted. Minimal left pleural effusion is noted. Increased interstitial densities are noted throughout both lungs which may represent edema or possibly inflammation. 2.0 x 1.6 cm pleural-based density is seen laterally in right upper lobe which was not present on prior exam and most likely represents focal atelectasis or inflammation. Upper Abdomen: Visualized portion of upper abdomen is unremarkable. Musculoskeletal: No significant osseous abnormality is noted. IMPRESSION: 4.8 cm ascending thoracic aortic aneurysm is noted. Ascending thoracic aortic aneurysm. Recommend semi-annual imaging followup by CTA or MRA and referral to cardiothoracic surgery if not already obtained. This recommendation follows 2010  ACCF/AHA/AATS/ACR/ASA/SCA/SCAI/SIR/STS/SVM Guidelines for the Diagnosis and Management of Patients With Thoracic Aortic Disease. Circulation. 2010; [id number]. Stable mediastinal adenopathy is noted which most likely is reactive in etiology. Moderate right  pleural effusion is noted. Mildly increased interstitial densities are noted throughout both lungs which may represent edema or possibly atypical inflammation. 2 cm pleural base density is seen laterally in right upper lobe which was not present on prior exam and most likely represents focal atelectasis or inflammation. However, follow-up CT scan in 2-3 weeks is recommended to ensure resolution and rule out possible underlying neoplasm. Electronically Signed   By: Marijo Conception, M.D.   On: 01/04/2016 10:42

## 2016-01-09 NOTE — Patient Instructions (Addendum)
Your physician recommends that you schedule a follow-up appointment in: 3 Months with Dr. Harl Bowie  Your physician has recommended you make the following change in your medication:   Zithromax 250 mg Take 2 Tablets on day one. Take 1 Tablet Daily on days 2-5.   Medrol Dose Pack over 6 Days   Have Chest X-Ray done in one month   If you need a refill on your cardiac medications before your next appointment, please call your pharmacy.  Thank you for choosing [facility]!

## 2016-01-30 LAB — BASIC METABOLIC PANEL
BUN: 29 mg/dL — AB (ref 7–25)
CHLORIDE: 99 mmol/L (ref 98–110)
CO2: 25 mmol/L (ref 20–31)
Calcium: 8.5 mg/dL — ABNORMAL LOW (ref 8.6–10.3)
Creat: 1.62 mg/dL — ABNORMAL HIGH (ref 0.70–1.11)
Glucose, Bld: 102 mg/dL — ABNORMAL HIGH (ref 65–99)
POTASSIUM: 4.9 mmol/L (ref 3.5–5.3)
Sodium: 133 mmol/L — ABNORMAL LOW (ref 135–146)

## 2016-01-30 LAB — MAGNESIUM: Magnesium: 1.7 mg/dL (ref 1.5–2.5)

## 2016-02-10 ENCOUNTER — Other Ambulatory Visit: Payer: Self-pay | Admitting: Physician Assistant

## 2016-02-12 ENCOUNTER — Other Ambulatory Visit: Payer: Self-pay

## 2016-02-12 MED ORDER — ATORVASTATIN CALCIUM 80 MG PO TABS: 80.0000 mg | ORAL_TABLET | Freq: Every day | ORAL | 3 refills | Status: AC

## 2016-02-12 NOTE — Telephone Encounter (Signed)
Lipitor refilled.

## 2016-02-13 ENCOUNTER — Ambulatory Visit (HOSPITAL_COMMUNITY)
Admission: RE | Admit: 2016-02-13 | Discharge: 2016-02-13 | Disposition: A | Discharge status: Home / Self Care | Payer: Medicare HMO | Source: Ambulatory Visit | Attending: Adult Health | Admitting: Adult Health

## 2016-02-13 ENCOUNTER — Telehealth: Payer: Self-pay | Admitting: *Deleted

## 2016-02-13 DIAGNOSIS — I509 Heart failure, unspecified: Secondary | ICD-10-CM

## 2016-02-13 DIAGNOSIS — J811 Chronic pulmonary edema: Secondary | ICD-10-CM | POA: Insufficient documentation

## 2016-02-13 DIAGNOSIS — Z8701 Personal history of pneumonia (recurrent): Secondary | ICD-10-CM | POA: Insufficient documentation

## 2016-02-13 DIAGNOSIS — Z79899 Other long term (current) drug therapy: Secondary | ICD-10-CM

## 2016-02-13 NOTE — Telephone Encounter (Signed)
-----   Message from Lendon Colonel, NP sent at 02/13/2016 10:48 AM EST ----- Increase torsemide to 20 mg daily with daily potassium. Repeat BMET in 5 days. He will need follow up CXR next week please.

## 2016-02-20 ENCOUNTER — Other Ambulatory Visit (HOSPITAL_COMMUNITY)
Admission: RE | Admit: 2016-02-20 | Discharge: 2016-02-20 | Disposition: A | Discharge status: Home / Self Care | Payer: Medicare HMO | Source: Ambulatory Visit | Attending: Adult Health | Admitting: Adult Health

## 2016-02-20 ENCOUNTER — Ambulatory Visit (HOSPITAL_COMMUNITY)
Admission: RE | Admit: 2016-02-20 | Discharge: 2016-02-20 | Disposition: A | Discharge status: Home / Self Care | Payer: Medicare HMO | Source: Ambulatory Visit | Attending: Adult Health | Admitting: Adult Health

## 2016-02-20 DIAGNOSIS — I517 Cardiomegaly: Secondary | ICD-10-CM | POA: Diagnosis not present

## 2016-02-20 DIAGNOSIS — I509 Heart failure, unspecified: Secondary | ICD-10-CM | POA: Diagnosis present

## 2016-02-20 DIAGNOSIS — Z79899 Other long term (current) drug therapy: Secondary | ICD-10-CM | POA: Insufficient documentation

## 2016-02-20 LAB — BASIC METABOLIC PANEL
ANION GAP: 7 (ref 5–15)
BUN: 36 mg/dL — ABNORMAL HIGH (ref 6–20)
CALCIUM: 8.7 mg/dL — AB (ref 8.9–10.3)
CHLORIDE: 104 mmol/L (ref 101–111)
CO2: 26 mmol/L (ref 22–32)
CREATININE: 2.08 mg/dL — AB (ref 0.61–1.24)
GFR calc non Af Amer: 27 mL/min — ABNORMAL LOW (ref >60)
GFR, EST AFRICAN AMERICAN: 32 mL/min — AB (ref >60)
Glucose, Bld: 135 mg/dL — ABNORMAL HIGH (ref 65–99)
Potassium: 4.4 mmol/L (ref 3.5–5.1)
SODIUM: 137 mmol/L (ref 135–145)

## 2016-03-29 ENCOUNTER — Emergency Department (HOSPITAL_COMMUNITY)
Admission: EM | Admit: 2016-03-29 | Discharge: 2016-03-29 | Disposition: A | Discharge status: Home / Self Care | Payer: Medicare HMO | Attending: Emergency Medicine | Admitting: Emergency Medicine

## 2016-03-29 ENCOUNTER — Encounter (HOSPITAL_COMMUNITY): Payer: Self-pay | Admitting: Emergency Medicine

## 2016-03-29 ENCOUNTER — Emergency Department (HOSPITAL_COMMUNITY): Payer: Medicare HMO

## 2016-03-29 DIAGNOSIS — Z87891 Personal history of nicotine dependence: Secondary | ICD-10-CM | POA: Insufficient documentation

## 2016-03-29 DIAGNOSIS — R310 Gross hematuria: Secondary | ICD-10-CM | POA: Diagnosis not present

## 2016-03-29 DIAGNOSIS — Z79899 Other long term (current) drug therapy: Secondary | ICD-10-CM | POA: Insufficient documentation

## 2016-03-29 DIAGNOSIS — I2510 Atherosclerotic heart disease of native coronary artery without angina pectoris: Secondary | ICD-10-CM | POA: Insufficient documentation

## 2016-03-29 DIAGNOSIS — R319 Hematuria, unspecified: Secondary | ICD-10-CM | POA: Diagnosis present

## 2016-03-29 DIAGNOSIS — I110 Hypertensive heart disease with heart failure: Secondary | ICD-10-CM | POA: Insufficient documentation

## 2016-03-29 DIAGNOSIS — Z8546 Personal history of malignant neoplasm of prostate: Secondary | ICD-10-CM | POA: Diagnosis not present

## 2016-03-29 DIAGNOSIS — I252 Old myocardial infarction: Secondary | ICD-10-CM | POA: Diagnosis not present

## 2016-03-29 DIAGNOSIS — I5042 Chronic combined systolic (congestive) and diastolic (congestive) heart failure: Secondary | ICD-10-CM | POA: Insufficient documentation

## 2016-03-29 DIAGNOSIS — Z7982 Long term (current) use of aspirin: Secondary | ICD-10-CM | POA: Diagnosis not present

## 2016-03-29 DIAGNOSIS — Z951 Presence of aortocoronary bypass graft: Secondary | ICD-10-CM | POA: Diagnosis not present

## 2016-03-29 DIAGNOSIS — E039 Hypothyroidism, unspecified: Secondary | ICD-10-CM | POA: Diagnosis not present

## 2016-03-29 DIAGNOSIS — Z8521 Personal history of malignant neoplasm of larynx: Secondary | ICD-10-CM | POA: Diagnosis not present

## 2016-03-29 LAB — COMPREHENSIVE METABOLIC PANEL
ALBUMIN: 3.1 g/dL — AB (ref 3.5–5.0)
ALK PHOS: 68 U/L (ref 38–126)
ALT: 19 U/L (ref 17–63)
ANION GAP: 9 (ref 5–15)
AST: 25 U/L (ref 15–41)
BUN: 39 mg/dL — ABNORMAL HIGH (ref 6–20)
CO2: 25 mmol/L (ref 22–32)
Calcium: 8.6 mg/dL — ABNORMAL LOW (ref 8.9–10.3)
Chloride: 104 mmol/L (ref 101–111)
Creatinine, Ser: 2.06 mg/dL — ABNORMAL HIGH (ref 0.61–1.24)
GFR calc Af Amer: 32 mL/min — ABNORMAL LOW (ref >60)
GFR calc non Af Amer: 28 mL/min — ABNORMAL LOW (ref >60)
GLUCOSE: 107 mg/dL — AB (ref 65–99)
POTASSIUM: 4.9 mmol/L (ref 3.5–5.1)
SODIUM: 138 mmol/L (ref 135–145)
Total Bilirubin: 0.7 mg/dL (ref 0.3–1.2)
Total Protein: 6.5 g/dL (ref 6.5–8.1)

## 2016-03-29 LAB — CBC WITH DIFFERENTIAL/PLATELET
BASOS PCT: 1 %
Basophils Absolute: 0 10*3/uL (ref 0.0–0.1)
EOS ABS: 0.2 10*3/uL (ref 0.0–0.7)
Eosinophils Relative: 2 %
HCT: 33.4 % — ABNORMAL LOW (ref 39.0–52.0)
HEMOGLOBIN: 10 g/dL — AB (ref 13.0–17.0)
Lymphocytes Relative: 8 %
Lymphs Abs: 0.7 10*3/uL (ref 0.7–4.0)
MCH: 25.8 pg — ABNORMAL LOW (ref 26.0–34.0)
MCHC: 29.9 g/dL — AB (ref 30.0–36.0)
MCV: 86.1 fL (ref 78.0–100.0)
MONOS PCT: 14 %
Monocytes Absolute: 1.2 10*3/uL — ABNORMAL HIGH (ref 0.1–1.0)
NEUTROS PCT: 75 %
Neutro Abs: 6.4 10*3/uL (ref 1.7–7.7)
Platelets: 284 10*3/uL (ref 150–400)
RBC: 3.88 MIL/uL — ABNORMAL LOW (ref 4.22–5.81)
RDW: 17.5 % — AB (ref 11.5–15.5)
WBC: 8.5 10*3/uL (ref 4.0–10.5)

## 2016-03-29 LAB — URINALYSIS, ROUTINE W REFLEX MICROSCOPIC
Bilirubin Urine: NEGATIVE
Glucose, UA: NEGATIVE mg/dL
Nitrite: POSITIVE — AB
PH: 7 (ref 5.0–8.0)
Protein, ur: >300 mg/dL — AB
SPECIFIC GRAVITY, URINE: 1.015 (ref 1.005–1.030)

## 2016-03-29 LAB — URINALYSIS, MICROSCOPIC (REFLEX)

## 2016-03-29 LAB — BRAIN NATRIURETIC PEPTIDE: B Natriuretic Peptide: 2006 pg/mL — ABNORMAL HIGH (ref 0.0–100.0)

## 2016-03-29 MED ORDER — DEXTROSE 5 % IV SOLN
1.0000 g | Freq: Once | INTRAVENOUS | Status: AC
  Administered 2016-03-29: 1 g via INTRAVENOUS
  Filled 2016-03-29: qty 10

## 2016-03-29 MED ORDER — FUROSEMIDE 10 MG/ML IJ SOLN
40.0000 mg | Freq: Once | INTRAMUSCULAR | Status: AC
  Administered 2016-03-29: 40 mg via INTRAVENOUS
  Filled 2016-03-29: qty 4

## 2016-03-29 MED ORDER — IPRATROPIUM-ALBUTEROL 0.5-2.5 (3) MG/3ML IN SOLN
3.0000 mL | Freq: Once | RESPIRATORY_TRACT | Status: AC
  Administered 2016-03-29: 3 mL via RESPIRATORY_TRACT
  Filled 2016-03-29: qty 3

## 2016-03-29 MED ORDER — ALBUTEROL SULFATE (2.5 MG/3ML) 0.083% IN NEBU
2.5000 mg | INHALATION_SOLUTION | Freq: Once | RESPIRATORY_TRACT | Status: AC
  Administered 2016-03-29: 2.5 mg via RESPIRATORY_TRACT
  Filled 2016-03-29: qty 3

## 2016-03-29 NOTE — ED Triage Notes (Signed)
Patient states blood clots in urin and has not been able to void since 10:30 am today. States history of prostate cancer. States lower abdominal pain that radiate to groin.

## 2016-03-29 NOTE — ED Provider Notes (Signed)
[facility] DEPT Provider Note   CSN: [id number] Arrival date & time: 03/29/16  1525     History   Chief Complaint Chief Complaint  Patient presents with  . Hematuria  . Groin Pain    HPI [name] [name] is a 83 y.o. male.  Family and patient complains of bloody urine. He started antibiotics just recently for UTI.   The history is provided by the patient. No language interpreter was used.  Hematuria  This is a new problem. The current episode started 3 to 5 hours ago. The problem occurs constantly. The problem has not changed since onset.Pertinent negatives include no chest pain, no abdominal pain and no headaches. Nothing aggravates the symptoms. Nothing relieves the symptoms.  Groin Pain  Pertinent negatives include no chest pain, no abdominal pain and no headaches.    Past Medical History:  Diagnosis Date  . Bradycardia    a. 2/2 amio after CABG 2017  . CAD in native artery    a. NSTEMI/near-STEMI equivalent 08/2015 s/p CABG.  . Chronic combined systolic and diastolic CHF (congestive heart failure) ([place])   . DJD (degenerative joint disease)   . First degree heart block   . GERD (gastroesophageal reflux disease)   . H/O hiatal hernia   . History of cancer of larynx 1996--- S/P  REMOVAL 1/3 VOICE BOX  AND RADIATION TX   NO RECURRENCE---  RESIDUAL , PT SPEECH A WHISPER  . Hypertension   . Hypertensive heart disease   . Hyponatremia   . Hypothyroidism   . Non-STEMI (non-ST elevated myocardial infarction) ([place]) 09/18/2015  . Paroxysmal atrial fibrillation (HCC)    a. post op CABG 2017 -> brady/n/v on amiodarone; not anticoagulated due to advanced age per notes.  . Prostate cancer ([place]) STAGE T2a,     FOLLOWED BY DR Diona Fanti AND DR MANNING  . S/P CABG (coronary artery bypass graft)   . Weakness of voice PT CAN ONLY WHISPER---  SECONDARY TO VOCAL CORD CA  S/P REMOVAL 1/3  VOICE BOX    Patient Active Problem List   Diagnosis Date Noted  . Malnutrition of moderate  degree 10/20/2015  . Elevated troponin I level 10/17/2015  . Hyponatremia 10/17/2015  . Anemia due to other cause 10/17/2015  . Hypothyroidism 10/17/2015  . Acute CHF (congestive heart failure) ([place]) 10/17/2015  . S/P CABG (coronary artery bypass graft)   . CAD (coronary artery disease) 09/22/2015  . Coronary artery disease involving native coronary artery of native heart without angina pectoris   . Postinfarction angina ([place])   . Non-STEMI (non-ST elevated myocardial infarction) ([place]) 09/18/2015  . Hypertension 09/18/2015  . GERD (gastroesophageal reflux disease) 09/18/2015  . NSTEMI (non-ST elevated myocardial infarction) ([place]) 09/18/2015  . ST elevation (STEMI) myocardial infarction of unspecified site ([place]) 09/18/2015  . ST elevation myocardial infarction (STEMI) ([place])   . Malignant neoplasm of prostate ([place]) 08/22/2011    Past Surgical History:  Procedure Laterality Date  . CARDIAC CATHETERIZATION N/A 09/18/2015   Procedure: Left Heart Cath and Coronary Angiography;  Surgeon: Troy Sine, MD;  Location: [facility];  Service: Cardiovascular;  Laterality: N/A;  . CARDIAC CATHETERIZATION N/A 09/21/2015   Procedure: IABP Insertion;  Surgeon: Leonie Man, MD;  Location: [facility];  Service: Cardiovascular;  Laterality: N/A;  . CARDIOVASCULAR STRESS TEST  5 YRS AGO   PT STATES NORMAL  . CATARACT EXTRACTION W/ INTRAOCULAR LENS  IMPLANT, BILATERAL    . CORONARY ARTERY BYPASS GRAFT  N/A 09/22/2015   Procedure: CORONARY ARTERY BYPASS GRAFTING (CABG) TIMES  THREE  USING LEFT INTERNAL MAMMARY ARTERY AND RIGHT SAPHENOUS VEIN HARVESTED ENDOSCOPICALLY, CORONARY ENDARTERECTOMY;  Surgeon: Ivin Poot, MD;  Location: [facility];  Service: Open Heart Surgery;  Laterality: N/A;  . CYSTOSCOPY  08/02/2011   Procedure: CYSTOSCOPY FLEXIBLE;  Surgeon: Franchot Gallo, MD;  Location: [facility];  Service: Urology;  Laterality: N/A;  . LARYNX SURGERY  x3  1996   BX'S  AND 1/3 OF VOICE BOX REMOVED DUE TO CANCER--  NO RECURRENCE---  (RESIDUAL , WHISPERS)  . RADIOACTIVE SEED IMPLANT  08/02/2011   Procedure: RADIOACTIVE SEED IMPLANT;  Surgeon: Franchot Gallo, MD;  Location: [facility];  Service: Urology;  Laterality: N/A;  C-ARM RAD TECH OK PER [place] AT MAIN  . TEE WITHOUT CARDIOVERSION N/A 09/22/2015   Procedure: TRANSESOPHAGEAL ECHOCARDIOGRAM (TEE);  Surgeon: Ivin Poot, MD;  Location: [facility];  Service: Open Heart Surgery;  Laterality: N/A;       Home Medications    Prior to Admission medications   Medication Sig Start Date End Date Taking? Authorizing Provider  albuterol (PROVENTIL) (2.5 MG/3ML) 0.083% nebulizer solution Take 2.5 mg by nebulization 3 (three) times daily.   Yes Historical Provider, MD  aspirin EC 81 MG EC tablet Take 1 tablet (81 mg total) by mouth daily. 09/30/15  Yes Erin R Barrett, PA-C  atorvastatin (LIPITOR) 80 MG tablet Take 1 tablet (80 mg total) by mouth daily at 6 PM. 02/12/16  Yes Arnoldo Lenis, MD  Bilberry, Vaccinium myrtillus, (BILBERRY PO) Take 2 tablets by mouth daily.    Yes Historical Provider, MD  carvedilol (COREG) 3.125 MG tablet Take 3.125 mg by mouth 2 (two) times daily with a meal.   Yes Historical Provider, MD  ciprofloxacin (CIPRO) 500 MG tablet Take 500 mg by mouth 2 (two) times daily. 7 day course starting on 03/27/2015   Yes Historical Provider, MD  GARLIC PO Take 1 tablet by mouth daily.   Yes Historical Provider, MD  levothyroxine (SYNTHROID, LEVOTHROID) 50 MCG tablet Take 50 mcg by mouth every morning.   Yes Historical Provider, MD  lisinopril (PRINIVIL,ZESTRIL) 20 MG tablet Take 1 tablet (20 mg total) by mouth daily. 10/21/15  Yes Lucia Gaskins, MD  magnesium gluconate (MAGONATE) 500 MG tablet Take 500 mg by mouth at bedtime.    Yes Historical Provider, MD  Multiple Vitamins-Minerals (PRESERVISION AREDS 2) CAPS Take 2 capsules by mouth daily.    Yes Historical Provider, MD    omeprazole (PRILOSEC) 20 MG capsule Take 20 mg by mouth every morning.    Yes Historical Provider, MD  potassium chloride (K-DUR) 10 MEQ tablet Take 20 mEq by mouth 2 (two) times daily.   Yes Historical Provider, MD  torsemide (DEMADEX) 20 MG tablet Take 1 tablet (20 mg total) by mouth every other day. Patient taking differently: Take 20 mg by mouth daily.  01/08/16 04/07/16 Yes Arnoldo Lenis, MD    Family History Family History  Problem Relation Age of Onset  . Congestive Heart Failure Mother   . Stroke Father     Social History Social History  Substance Use Topics  . Smoking status: Former Smoker    Packs/day: 3.00    Years: 45.00    Types: Cigarettes    Quit date: 07/31/1990  . Smokeless tobacco: Never Used  . Alcohol use No     Allergies   Sulfa antibiotics and Contrast media [iodinated diagnostic  agents]   Review of Systems Review of Systems  Constitutional: Negative for appetite change and fatigue.  HENT: Negative for congestion, ear discharge and sinus pressure.   Eyes: Negative for discharge.  Respiratory: Negative for cough.   Cardiovascular: Negative for chest pain.  Gastrointestinal: Negative for abdominal pain and diarrhea.  Genitourinary: Positive for hematuria. Negative for frequency.  Musculoskeletal: Negative for back pain.  Skin: Negative for rash.  Neurological: Negative for seizures and headaches.  Psychiatric/Behavioral: Negative for hallucinations.     Physical Exam Updated Vital Signs BP 135/91   Pulse 85   Temp 97.4 F (36.3 C) (Oral)   Resp 25   Ht 5\' 10"  (1.778 m)   Wt 176 lb (79.8 kg)   SpO2 96%   BMI 25.25 kg/m   Physical Exam  Constitutional: He is oriented to person, place, and time. He appears well-developed.  HENT:  Head: Normocephalic.  Eyes: Conjunctivae and EOM are normal. No scleral icterus.  Neck: Neck supple. No thyromegaly present.  Cardiovascular: Normal rate and regular rhythm.  Exam reveals no gallop and no  friction rub.   No murmur heard. Pulmonary/Chest: No stridor. He has no wheezes. He has rales. He exhibits no tenderness.  Abdominal: He exhibits no distension. There is no tenderness. There is no rebound.  Musculoskeletal: Normal range of motion. He exhibits no edema.  Lymphadenopathy:    He has no cervical adenopathy.  Neurological: He is oriented to person, place, and time. He exhibits normal muscle tone. Coordination normal.  Skin: No rash noted. No erythema.  Psychiatric: He has a normal mood and affect. His behavior is normal.     ED Treatments / Results  Labs (all labs ordered are listed, but only abnormal results are displayed) Labs Reviewed  CBC WITH DIFFERENTIAL/PLATELET - Abnormal; Notable for the following:       Result Value   RBC 3.88 (*)    Hemoglobin 10.0 (*)    HCT 33.4 (*)    MCH 25.8 (*)    MCHC 29.9 (*)    RDW 17.5 (*)    Monocytes Absolute 1.2 (*)    All other components within normal limits  COMPREHENSIVE METABOLIC PANEL - Abnormal; Notable for the following:    Glucose, Bld 107 (*)    BUN 39 (*)    Creatinine, Ser 2.06 (*)    Calcium 8.6 (*)    Albumin 3.1 (*)    GFR calc non Af Amer 28 (*)    GFR calc Af Amer 32 (*)    All other components within normal limits  BRAIN NATRIURETIC PEPTIDE - Abnormal; Notable for the following:    B Natriuretic Peptide 2,006.0 (*)    All other components within normal limits  URINALYSIS, ROUTINE W REFLEX MICROSCOPIC - Abnormal; Notable for the following:    Color, Urine RED (*)    APPearance HAZY (*)    Hgb urine dipstick LARGE (*)    Ketones, ur TRACE (*)    Protein, ur >300 (*)    Nitrite POSITIVE (*)    Leukocytes, UA SMALL (*)    All other components within normal limits  URINALYSIS, MICROSCOPIC (REFLEX) - Abnormal; Notable for the following:    Bacteria, UA FEW (*)    Squamous Epithelial / LPF 0-5 (*)    All other components within normal limits  URINE CULTURE    EKG  EKG Interpretation None        Radiology Dg Chest Portable 1 View  Result Date:  03/29/2016 CLINICAL DATA:  Shortness of breath. EXAM: PORTABLE CHEST 1 VIEW COMPARISON:  02/20/2016 FINDINGS: Stable heart size which is at the upper limits of normal with evidence of prior CABG. Lung volumes are low. There is evidence of probable underlying interstitial edema/CHF. This may be slightly worse compared to the prior chest x-ray. There may be a component of bilateral pleural fluid. IMPRESSION: Probable underlying interstitial edema/ CHF which appears slightly you worse compared to the prior chest x-ray. There may be bilateral pleural effusions. Electronically Signed   By: Aletta Edouard M.D.   On: 03/29/2016 16:50    Procedures Procedures (including critical care time)  Medications Ordered in ED Medications  ipratropium-albuterol (DUONEB) 0.5-2.5 (3) MG/3ML nebulizer solution 3 mL (3 mLs Nebulization Given 03/29/16 1707)  albuterol (PROVENTIL) (2.5 MG/3ML) 0.083% nebulizer solution 2.5 mg (2.5 mg Nebulization Given 03/29/16 1707)  cefTRIAXone (ROCEPHIN) 1 g in dextrose 5 % 50 mL IVPB (1 g Intravenous New Bag/Given 03/29/16 2034)  furosemide (LASIX) injection 40 mg (40 mg Intravenous Given 03/29/16 2026)     Initial Impression / Assessment and Plan / ED Course  I have reviewed the triage vital signs and the nursing notes.  Pertinent labs & imaging results that were available during my care of the patient were reviewed by me and considered in my medical decision making (see chart for details).  Clinical Course     Patient with hematuria on antibiotics. Urine culture done patient given a dose of Rocephin. Patient also with mild congestive heart failure sats normal on room air. Patient given an extra dose of Lasix and will follow-up with his PCP this week Final Clinical Impressions(s) / ED Diagnoses   Final diagnoses:  Gross hematuria    New Prescriptions New Prescriptions   No medications on file     Kingsten Ferguson,  MD 03/29/16 2111

## 2016-03-29 NOTE — Discharge Instructions (Signed)
Follow up with your md Tuesday as planned

## 2016-03-31 LAB — URINE CULTURE: CULTURE: NO GROWTH

## 2016-04-01 ENCOUNTER — Encounter (HOSPITAL_COMMUNITY): Payer: Self-pay | Admitting: Emergency Medicine

## 2016-04-01 ENCOUNTER — Emergency Department (HOSPITAL_COMMUNITY)
Admission: EM | Admit: 2016-04-01 | Discharge: 2016-04-01 | Disposition: A | Discharge status: Home / Self Care | Payer: Medicare HMO | Attending: Emergency Medicine | Admitting: Emergency Medicine

## 2016-04-01 DIAGNOSIS — E039 Hypothyroidism, unspecified: Secondary | ICD-10-CM | POA: Diagnosis not present

## 2016-04-01 DIAGNOSIS — I25810 Atherosclerosis of coronary artery bypass graft(s) without angina pectoris: Secondary | ICD-10-CM | POA: Diagnosis not present

## 2016-04-01 DIAGNOSIS — I110 Hypertensive heart disease with heart failure: Secondary | ICD-10-CM | POA: Insufficient documentation

## 2016-04-01 DIAGNOSIS — I509 Heart failure, unspecified: Secondary | ICD-10-CM | POA: Diagnosis not present

## 2016-04-01 DIAGNOSIS — R319 Hematuria, unspecified: Secondary | ICD-10-CM | POA: Insufficient documentation

## 2016-04-01 DIAGNOSIS — Z7982 Long term (current) use of aspirin: Secondary | ICD-10-CM | POA: Diagnosis not present

## 2016-04-01 DIAGNOSIS — Z79899 Other long term (current) drug therapy: Secondary | ICD-10-CM | POA: Insufficient documentation

## 2016-04-01 DIAGNOSIS — Z87891 Personal history of nicotine dependence: Secondary | ICD-10-CM | POA: Diagnosis not present

## 2016-04-01 DIAGNOSIS — Z951 Presence of aortocoronary bypass graft: Secondary | ICD-10-CM | POA: Insufficient documentation

## 2016-04-01 DIAGNOSIS — Z8546 Personal history of malignant neoplasm of prostate: Secondary | ICD-10-CM | POA: Insufficient documentation

## 2016-04-01 DIAGNOSIS — R339 Retention of urine, unspecified: Secondary | ICD-10-CM

## 2016-04-01 NOTE — ED Notes (Signed)
Pt educated and instructed on how to irrigate foley catheter.  Pt and wife verbally acknowledges ability to perform this task.

## 2016-04-01 NOTE — ED Triage Notes (Addendum)
Pt reports foley insertion on Saturday, with leakage all weekend and no urine output.

## 2016-04-02 ENCOUNTER — Emergency Department (HOSPITAL_COMMUNITY)
Admission: EM | Admit: 2016-04-02 | Discharge: 2016-04-02 | Disposition: A | Discharge status: Home / Self Care | Payer: Medicare HMO | Attending: Emergency Medicine | Admitting: Emergency Medicine

## 2016-04-02 ENCOUNTER — Encounter (HOSPITAL_COMMUNITY): Payer: Self-pay | Admitting: Emergency Medicine

## 2016-04-02 DIAGNOSIS — Z8546 Personal history of malignant neoplasm of prostate: Secondary | ICD-10-CM | POA: Diagnosis not present

## 2016-04-02 DIAGNOSIS — Z7982 Long term (current) use of aspirin: Secondary | ICD-10-CM | POA: Diagnosis not present

## 2016-04-02 DIAGNOSIS — T83098A Other mechanical complication of other indwelling urethral catheter, initial encounter: Secondary | ICD-10-CM | POA: Diagnosis present

## 2016-04-02 DIAGNOSIS — Y733 Surgical instruments, materials and gastroenterology and urology devices (including sutures) associated with adverse incidents: Secondary | ICD-10-CM | POA: Diagnosis not present

## 2016-04-02 DIAGNOSIS — Z79899 Other long term (current) drug therapy: Secondary | ICD-10-CM | POA: Insufficient documentation

## 2016-04-02 DIAGNOSIS — I2510 Atherosclerotic heart disease of native coronary artery without angina pectoris: Secondary | ICD-10-CM | POA: Diagnosis not present

## 2016-04-02 DIAGNOSIS — I5042 Chronic combined systolic (congestive) and diastolic (congestive) heart failure: Secondary | ICD-10-CM | POA: Insufficient documentation

## 2016-04-02 DIAGNOSIS — Z87891 Personal history of nicotine dependence: Secondary | ICD-10-CM | POA: Diagnosis not present

## 2016-04-02 DIAGNOSIS — Z951 Presence of aortocoronary bypass graft: Secondary | ICD-10-CM | POA: Diagnosis not present

## 2016-04-02 DIAGNOSIS — T839XXA Unspecified complication of genitourinary prosthetic device, implant and graft, initial encounter: Secondary | ICD-10-CM

## 2016-04-02 DIAGNOSIS — E039 Hypothyroidism, unspecified: Secondary | ICD-10-CM | POA: Insufficient documentation

## 2016-04-02 DIAGNOSIS — Z8521 Personal history of malignant neoplasm of larynx: Secondary | ICD-10-CM | POA: Diagnosis not present

## 2016-04-02 DIAGNOSIS — I110 Hypertensive heart disease with heart failure: Secondary | ICD-10-CM | POA: Diagnosis not present

## 2016-04-02 DIAGNOSIS — R8299 Other abnormal findings in urine: Secondary | ICD-10-CM | POA: Diagnosis not present

## 2016-04-02 LAB — URINALYSIS, ROUTINE W REFLEX MICROSCOPIC
BILIRUBIN URINE: NEGATIVE
GLUCOSE, UA: NEGATIVE mg/dL
KETONES UR: NEGATIVE mg/dL
NITRITE: NEGATIVE
PROTEIN: 30 mg/dL — AB
Specific Gravity, Urine: 1.008 (ref 1.005–1.030)
pH: 6 (ref 5.0–8.0)

## 2016-04-02 NOTE — ED Provider Notes (Signed)
[facility] DEPT Provider Note   CSN: [id number] Arrival date & time: 04/02/16  1411     History   Chief Complaint Chief Complaint  Patient presents with  . Foley Catheter Removed    HPI [name] [name] is a 83 y.o. male.  HPI  Pt with hx of prostate CA treated with seeds many years ago without recurrence - has had recent UTI - treated with cipro - was seen 1/5 - had foley placed b/c of retention - last night the foley wasn't draining - needed irrigation and today he had the catheter come out - he then noted that while waiting in the waiting room he was able to urinate spontaneously.  He reports no n/v/f/c or abdominal pain / discomfort.  Past Medical History:  Diagnosis Date  . Bradycardia    a. 2/2 amio after CABG 2017  . CAD in native artery    a. NSTEMI/near-STEMI equivalent 08/2015 s/p CABG.  . Chronic combined systolic and diastolic CHF (congestive heart failure) ([place])   . DJD (degenerative joint disease)   . First degree heart block   . GERD (gastroesophageal reflux disease)   . H/O hiatal hernia   . History of cancer of larynx 1996--- S/P  REMOVAL 1/3 VOICE BOX  AND RADIATION TX   NO RECURRENCE---  RESIDUAL , PT SPEECH A WHISPER  . Hypertension   . Hypertensive heart disease   . Hyponatremia   . Hypothyroidism   . Non-STEMI (non-ST elevated myocardial infarction) ([place]) 09/18/2015  . Paroxysmal atrial fibrillation (HCC)    a. post op CABG 2017 -> brady/n/v on amiodarone; not anticoagulated due to advanced age per notes.  . Prostate cancer ([place]) STAGE T2a,     FOLLOWED BY DR Diona Fanti AND DR MANNING  . S/P CABG (coronary artery bypass graft)   . Weakness of voice PT CAN ONLY WHISPER---  SECONDARY TO VOCAL CORD CA  S/P REMOVAL 1/3  VOICE BOX    Patient Active Problem List   Diagnosis Date Noted  . Malnutrition of moderate degree 10/20/2015  . Elevated troponin I level 10/17/2015  . Hyponatremia 10/17/2015  . Anemia due to other cause 10/17/2015  .  Hypothyroidism 10/17/2015  . Acute CHF (congestive heart failure) ([place]) 10/17/2015  . S/P CABG (coronary artery bypass graft)   . CAD (coronary artery disease) 09/22/2015  . Coronary artery disease involving native coronary artery of native heart without angina pectoris   . Postinfarction angina ([place])   . Non-STEMI (non-ST elevated myocardial infarction) ([place]) 09/18/2015  . Hypertension 09/18/2015  . GERD (gastroesophageal reflux disease) 09/18/2015  . NSTEMI (non-ST elevated myocardial infarction) ([place]) 09/18/2015  . ST elevation (STEMI) myocardial infarction of unspecified site ([place]) 09/18/2015  . ST elevation myocardial infarction (STEMI) ([place])   . Malignant neoplasm of prostate ([place]) 08/22/2011    Past Surgical History:  Procedure Laterality Date  . CARDIAC CATHETERIZATION N/A 09/18/2015   Procedure: Left Heart Cath and Coronary Angiography;  Surgeon: Troy Sine, MD;  Location: [facility];  Service: Cardiovascular;  Laterality: N/A;  . CARDIAC CATHETERIZATION N/A 09/21/2015   Procedure: IABP Insertion;  Surgeon: Leonie Man, MD;  Location: [facility];  Service: Cardiovascular;  Laterality: N/A;  . CARDIOVASCULAR STRESS TEST  5 YRS AGO   PT STATES NORMAL  . CATARACT EXTRACTION W/ INTRAOCULAR LENS  IMPLANT, BILATERAL    . CORONARY ARTERY BYPASS GRAFT N/A 09/22/2015   Procedure: CORONARY ARTERY BYPASS GRAFTING (CABG) TIMES  THREE  USING LEFT INTERNAL MAMMARY ARTERY AND RIGHT SAPHENOUS VEIN HARVESTED ENDOSCOPICALLY, CORONARY ENDARTERECTOMY;  Surgeon: Ivin Poot, MD;  Location: [facility];  Service: Open Heart Surgery;  Laterality: N/A;  . CYSTOSCOPY  08/02/2011   Procedure: CYSTOSCOPY FLEXIBLE;  Surgeon: Franchot Gallo, MD;  Location: [facility];  Service: Urology;  Laterality: N/A;  . LARYNX SURGERY  x3  1996   BX'S AND 1/3 OF VOICE BOX REMOVED DUE TO CANCER--  NO RECURRENCE---  (RESIDUAL , WHISPERS)  . RADIOACTIVE SEED IMPLANT  08/02/2011    Procedure: RADIOACTIVE SEED IMPLANT;  Surgeon: Franchot Gallo, MD;  Location: [facility];  Service: Urology;  Laterality: N/A;  C-ARM RAD TECH OK PER [facility] AT MAIN  . TEE WITHOUT CARDIOVERSION N/A 09/22/2015   Procedure: TRANSESOPHAGEAL ECHOCARDIOGRAM (TEE);  Surgeon: Ivin Poot, MD;  Location: [facility];  Service: Open Heart Surgery;  Laterality: N/A;       Home Medications    Prior to Admission medications   Medication Sig Start Date End Date Taking? Authorizing Provider  albuterol (PROVENTIL) (2.5 MG/3ML) 0.083% nebulizer solution Take 2.5 mg by nebulization 3 (three) times daily.    Historical Provider, MD  aspirin EC 81 MG EC tablet Take 1 tablet (81 mg total) by mouth daily. 09/30/15   Erin R Barrett, PA-C  atorvastatin (LIPITOR) 80 MG tablet Take 1 tablet (80 mg total) by mouth daily at 6 PM. 02/12/16   Arnoldo Lenis, MD  Bilberry, Vaccinium myrtillus, (BILBERRY PO) Take 2 tablets by mouth daily.     Historical Provider, MD  carvedilol (COREG) 3.125 MG tablet Take 3.125 mg by mouth 2 (two) times daily with a meal.    Historical Provider, MD  ciprofloxacin (CIPRO) 500 MG tablet Take 500 mg by mouth 2 (two) times daily. 7 day course starting on 03/27/2015    Historical Provider, MD  GARLIC PO Take 1 tablet by mouth daily.    Historical Provider, MD  levothyroxine (SYNTHROID, LEVOTHROID) 50 MCG tablet Take 50 mcg by mouth every morning.    Historical Provider, MD  lisinopril (PRINIVIL,ZESTRIL) 20 MG tablet Take 1 tablet (20 mg total) by mouth daily. 10/21/15   Lucia Gaskins, MD  magnesium gluconate (MAGONATE) 500 MG tablet Take 500 mg by mouth at bedtime.     Historical Provider, MD  Multiple Vitamins-Minerals (PRESERVISION AREDS 2) CAPS Take 2 capsules by mouth daily.     Historical Provider, MD  omeprazole (PRILOSEC) 20 MG capsule Take 20 mg by mouth every morning.     Historical Provider, MD  potassium chloride (K-DUR) 10 MEQ tablet Take 20 mEq by mouth 2 (two)  times daily.    Historical Provider, MD  torsemide (DEMADEX) 20 MG tablet Take 1 tablet (20 mg total) by mouth every other day. Patient taking differently: Take 20 mg by mouth daily.  01/08/16 04/07/16  Arnoldo Lenis, MD    Family History Family History  Problem Relation Age of Onset  . Congestive Heart Failure Mother   . Stroke Father     Social History Social History  Substance Use Topics  . Smoking status: Former Smoker    Packs/day: 3.00    Years: 45.00    Types: Cigarettes    Quit date: 07/31/1990  . Smokeless tobacco: Never Used  . Alcohol use No     Allergies   Sulfa antibiotics and Contrast media [iodinated diagnostic agents]   Review of Systems Review of Systems  All other systems reviewed and  are negative.    Physical Exam Updated Vital Signs BP (!) 105/54 (BP Location: Left Arm)   Pulse 70   Temp 98.6 F (37 C) (Temporal)   Resp 18   Ht 5\' 10"  (1.778 m)   Wt 173 lb (78.5 kg)   SpO2 100%   BMI 24.82 kg/m   Physical Exam  Constitutional: He appears well-developed and well-nourished. No distress.  HENT:  Head: Normocephalic and atraumatic.  Mouth/Throat: Oropharynx is clear and moist. No oropharyngeal exudate.  Eyes: Conjunctivae and EOM are normal. Pupils are equal, round, and reactive to light. Right eye exhibits no discharge. Left eye exhibits no discharge. No scleral icterus.  Neck: Normal range of motion. Neck supple. No JVD present. No thyromegaly present.  Cardiovascular: Normal rate, regular rhythm, normal heart sounds and intact distal pulses.  Exam reveals no gallop and no friction rub.   No murmur heard. Pulmonary/Chest: Effort normal and breath sounds normal. No respiratory distress. He has no wheezes. He has no rales.  Abdominal: Soft. Bowel sounds are normal. He exhibits no distension and no mass. There is no tenderness.  No abdominal fullness, no masses, no ttp  Musculoskeletal: Normal range of motion. He exhibits no edema or  tenderness.  Lymphadenopathy:    He has no cervical adenopathy.  Neurological: He is alert. Coordination normal.  Skin: Skin is warm and dry. No rash noted. No erythema.  Psychiatric: He has a normal mood and affect. His behavior is normal.  Nursing note and vitals reviewed.    ED Treatments / Results  Labs (all labs ordered are listed, but only abnormal results are displayed) Labs Reviewed  URINALYSIS, ROUTINE W REFLEX MICROSCOPIC - Abnormal; Notable for the following:       Result Value   APPearance HAZY (*)    Hgb urine dipstick LARGE (*)    Protein, ur 30 (*)    Leukocytes, UA TRACE (*)    Bacteria, UA FEW (*)    All other components within normal limits  URINE CULTURE     Radiology No results found.  Procedures Procedures (including critical care time)  Medications Ordered in ED Medications - No data to display   Initial Impression / Assessment and Plan / ED Course  I have reviewed the triage vital signs and the nursing notes.  Pertinent labs & imaging results that were available during my care of the patient were reviewed by me and considered in my medical decision making (see chart for details).  Clinical Course     Trial of urination - pt appears well, recheck UA to make sure it is being treated with appropriate abx.  Culture had no growth, if UA clear today , will l ikely stop abx and have f/u outpatient with urology (has appointment) for the urinary retention.  UA without obvious infection - culture ordered, last culture without growth, PVR < 50, pt stable for d/c. Understands indicadtions for return.  Final Clinical Impressions(s) / ED Diagnoses   Final diagnoses:  Foley catheter problem, initial encounter [facility])    New Prescriptions New Prescriptions   No medications on file     Noemi Chapel, MD 04/02/16 1751

## 2016-04-02 NOTE — ED Notes (Signed)
Pt states that he voided while in waiting room.  Denies any blood in urine at that time.

## 2016-04-02 NOTE — ED Triage Notes (Addendum)
Patient states "my catheter just slipped out. There was blood that came out with it." States "it was hurting real bad then it just came out."

## 2016-04-02 NOTE — ED Notes (Signed)
Pt urinated. Pink urine in cup. 83ml noted with bladder scanner after urinating. Pt states he feels his penis is swollen. edp aware.

## 2016-04-02 NOTE — ED Notes (Signed)
ED Provider at bedside. 

## 2016-04-04 LAB — URINE CULTURE

## 2016-04-05 NOTE — ED Provider Notes (Signed)
[facility] DEPT Provider Note   CSN: [id number] Arrival date & time: 04/01/16  1129     History   Chief Complaint Chief Complaint  Patient presents with  . Urinary Retention    HPI [name] [name] is a 83 y.o. male.  HPI   82 year old male presenting with leakage from his Foley catheter. He had a Foley catheter placed on Friday secondary to urinary retention. Today he has had some lower abdominal pain and has had no output from his catheter. He has leaked some bloody urine around the catheter itself though.  Past Medical History:  Diagnosis Date  . Bradycardia    a. 2/2 amio after CABG 2017  . CAD in native artery    a. NSTEMI/near-STEMI equivalent 08/2015 s/p CABG.  . Chronic combined systolic and diastolic CHF (congestive heart failure) ([place])   . DJD (degenerative joint disease)   . First degree heart block   . GERD (gastroesophageal reflux disease)   . H/O hiatal hernia   . History of cancer of larynx 1996--- S/P  REMOVAL 1/3 VOICE BOX  AND RADIATION TX   NO RECURRENCE---  RESIDUAL , PT SPEECH A WHISPER  . Hypertension   . Hypertensive heart disease   . Hyponatremia   . Hypothyroidism   . Non-STEMI (non-ST elevated myocardial infarction) ([place]) 09/18/2015  . Paroxysmal atrial fibrillation (HCC)    a. post op CABG 2017 -> brady/n/v on amiodarone; not anticoagulated due to advanced age per notes.  . Prostate cancer ([place]) STAGE T2a,     FOLLOWED BY DR Diona Fanti AND DR MANNING  . S/P CABG (coronary artery bypass graft)   . Weakness of voice PT CAN ONLY WHISPER---  SECONDARY TO VOCAL CORD CA  S/P REMOVAL 1/3  VOICE BOX    Patient Active Problem List   Diagnosis Date Noted  . Malnutrition of moderate degree 10/20/2015  . Elevated troponin I level 10/17/2015  . Hyponatremia 10/17/2015  . Anemia due to other cause 10/17/2015  . Hypothyroidism 10/17/2015  . Acute CHF (congestive heart failure) ([place]) 10/17/2015  . S/P CABG (coronary artery bypass graft)   . CAD  (coronary artery disease) 09/22/2015  . Coronary artery disease involving native coronary artery of native heart without angina pectoris   . Postinfarction angina ([place])   . Non-STEMI (non-ST elevated myocardial infarction) ([place]) 09/18/2015  . Hypertension 09/18/2015  . GERD (gastroesophageal reflux disease) 09/18/2015  . NSTEMI (non-ST elevated myocardial infarction) ([place]) 09/18/2015  . ST elevation (STEMI) myocardial infarction of unspecified site ([place]) 09/18/2015  . ST elevation myocardial infarction (STEMI) ([place])   . Malignant neoplasm of prostate ([place]) 08/22/2011    Past Surgical History:  Procedure Laterality Date  . CARDIAC CATHETERIZATION N/A 09/18/2015   Procedure: Left Heart Cath and Coronary Angiography;  Surgeon: Troy Sine, MD;  Location: [facility];  Service: Cardiovascular;  Laterality: N/A;  . CARDIAC CATHETERIZATION N/A 09/21/2015   Procedure: IABP Insertion;  Surgeon: Leonie Man, MD;  Location: [facility];  Service: Cardiovascular;  Laterality: N/A;  . CARDIOVASCULAR STRESS TEST  5 YRS AGO   PT STATES NORMAL  . CATARACT EXTRACTION W/ INTRAOCULAR LENS  IMPLANT, BILATERAL    . CORONARY ARTERY BYPASS GRAFT N/A 09/22/2015   Procedure: CORONARY ARTERY BYPASS GRAFTING (CABG) TIMES  THREE  USING LEFT INTERNAL MAMMARY ARTERY AND RIGHT SAPHENOUS VEIN HARVESTED ENDOSCOPICALLY, CORONARY ENDARTERECTOMY;  Surgeon: Ivin Poot, MD;  Location: [facility];  Service: Open Heart Surgery;  Laterality: N/A;  .  CYSTOSCOPY  08/02/2011   Procedure: CYSTOSCOPY FLEXIBLE;  Surgeon: Franchot Gallo, MD;  Location: [facility];  Service: Urology;  Laterality: N/A;  . LARYNX SURGERY  x3  1996   BX'S AND 1/3 OF VOICE BOX REMOVED DUE TO CANCER--  NO RECURRENCE---  (RESIDUAL , WHISPERS)  . RADIOACTIVE SEED IMPLANT  08/02/2011   Procedure: RADIOACTIVE SEED IMPLANT;  Surgeon: Franchot Gallo, MD;  Location: [facility];  Service: Urology;  Laterality:  N/A;  C-ARM RAD TECH OK PER [facility] AT MAIN  . TEE WITHOUT CARDIOVERSION N/A 09/22/2015   Procedure: TRANSESOPHAGEAL ECHOCARDIOGRAM (TEE);  Surgeon: Ivin Poot, MD;  Location: [facility];  Service: Open Heart Surgery;  Laterality: N/A;       Home Medications    Prior to Admission medications   Medication Sig Start Date End Date Taking? Authorizing Provider  aspirin EC 81 MG EC tablet Take 1 tablet (81 mg total) by mouth daily. 09/30/15  Yes Erin R Barrett, PA-C  atorvastatin (LIPITOR) 80 MG tablet Take 1 tablet (80 mg total) by mouth daily at 6 PM. 02/12/16  Yes Arnoldo Lenis, MD  Bilberry, Vaccinium myrtillus, (BILBERRY PO) Take 2 tablets by mouth daily.    Yes Historical Provider, MD  carvedilol (COREG) 3.125 MG tablet Take 3.125 mg by mouth 2 (two) times daily with a meal.   Yes Historical Provider, MD  ciprofloxacin (CIPRO) 500 MG tablet Take 500 mg by mouth 2 (two) times daily. 7 day course starting on 03/27/2015   Yes Historical Provider, MD  GARLIC PO Take 1 tablet by mouth daily.   Yes Historical Provider, MD  levothyroxine (SYNTHROID, LEVOTHROID) 50 MCG tablet Take 50 mcg by mouth every morning.   Yes Historical Provider, MD  lisinopril (PRINIVIL,ZESTRIL) 20 MG tablet Take 1 tablet (20 mg total) by mouth daily. 10/21/15  Yes Lucia Gaskins, MD  magnesium gluconate (MAGONATE) 500 MG tablet Take 500 mg by mouth at bedtime.    Yes Historical Provider, MD  Multiple Vitamins-Minerals (PRESERVISION AREDS 2) CAPS Take 2 capsules by mouth daily.    Yes Historical Provider, MD  omeprazole (PRILOSEC) 20 MG capsule Take 20 mg by mouth every morning.    Yes Historical Provider, MD  potassium chloride (K-DUR) 10 MEQ tablet Take 20 mEq by mouth 2 (two) times daily.   Yes Historical Provider, MD  torsemide (DEMADEX) 20 MG tablet Take 1 tablet (20 mg total) by mouth every other day. Patient taking differently: Take 20 mg by mouth daily.  01/08/16 04/07/16 Yes Arnoldo Lenis, MD  albuterol  (PROVENTIL) (2.5 MG/3ML) 0.083% nebulizer solution Take 2.5 mg by nebulization 3 (three) times daily.    Historical Provider, MD    Family History Family History  Problem Relation Age of Onset  . Congestive Heart Failure Mother   . Stroke Father     Social History Social History  Substance Use Topics  . Smoking status: Former Smoker    Packs/day: 3.00    Years: 45.00    Types: Cigarettes    Quit date: 07/31/1990  . Smokeless tobacco: Never Used  . Alcohol use No     Allergies   Sulfa antibiotics and Contrast media [iodinated diagnostic agents]   Review of Systems Review of Systems  All systems reviewed and negative, other than as noted in HPI.   Physical Exam Updated Vital Signs BP 109/63 (BP Location: Left Arm)   Pulse 63   Temp 97.4 F (36.3 C) (Oral)  Resp 18   Ht 5\' 10"  (1.778 m)   Wt 176 lb (79.8 kg)   SpO2 95%   BMI 25.25 kg/m   Physical Exam  Constitutional: He appears well-developed and well-nourished. No distress.  HENT:  Head: Normocephalic and atraumatic.  Eyes: Conjunctivae are normal. Right eye exhibits no discharge. Left eye exhibits no discharge.  Neck: Neck supple.  Cardiovascular: Normal rate, regular rhythm and normal heart sounds.  Exam reveals no gallop and no friction rub.   No murmur heard. Pulmonary/Chest: Effort normal and breath sounds normal. No respiratory distress.  Abdominal: Soft. He exhibits no distension. There is no tenderness.  Genitourinary:  Genitourinary Comments: Foley catheter in place. Grossly bloody urine noted in tubing and bag. Some small clots noted in the bag as well. Abdomen is soft and nontender. His bladder does not feel distended.  Musculoskeletal: He exhibits no edema or tenderness.  Neurological: He is alert.  Skin: Skin is warm and dry.  Psychiatric: He has a normal mood and affect. His behavior is normal. Thought content normal.  Nursing note and vitals reviewed.    ED Treatments / Results   Labs (all labs ordered are listed, but only abnormal results are displayed) Labs Reviewed - No data to display  EKG  EKG Interpretation None       Radiology No results found.  Procedures Procedures (including critical care time)  Medications Ordered in ED Medications - No data to display   Initial Impression / Assessment and Plan / ED Course  I have reviewed the triage vital signs and the nursing notes.  Pertinent labs & imaging results that were available during my care of the patient were reviewed by me and considered in my medical decision making (see chart for details).  Clinical Course   83 year old male with leakage around the Foley catheter. Likely secondary to hematuria. Cath was irrigated and appears to be functioning properly. He feels better. Patient has had a Foley catheter previously. He would like to be instructed how to irrigate it at home if needed. He was taught by nursing and supplies were provided. Follow-up with his urologistis recommended.  Final Clinical Impressions(s) / ED Diagnoses   Final diagnoses:  Urinary retention  Hematuria, unspecified type    New Prescriptions Discharge Medication List as of 04/01/2016 12:56 PM       Virgel Manifold, MD 04/05/16 [id number]

## 2016-04-09 ENCOUNTER — Ambulatory Visit (INDEPENDENT_AMBULATORY_CARE_PROVIDER_SITE_OTHER): Payer: Medicare HMO | Admitting: Urology

## 2016-04-09 DIAGNOSIS — C61 Malignant neoplasm of prostate: Secondary | ICD-10-CM

## 2016-04-09 DIAGNOSIS — C670 Malignant neoplasm of trigone of bladder: Secondary | ICD-10-CM | POA: Diagnosis not present

## 2016-04-09 DIAGNOSIS — R310 Gross hematuria: Secondary | ICD-10-CM | POA: Diagnosis not present

## 2016-04-12 ENCOUNTER — Emergency Department (HOSPITAL_COMMUNITY): Payer: Medicare HMO

## 2016-04-12 ENCOUNTER — Encounter (HOSPITAL_COMMUNITY): Payer: Self-pay | Admitting: Emergency Medicine

## 2016-04-12 ENCOUNTER — Emergency Department (HOSPITAL_COMMUNITY)
Admission: EM | Admit: 2016-04-12 | Discharge: 2016-04-25 | Disposition: E | Payer: Medicare HMO | Attending: Emergency Medicine | Admitting: Emergency Medicine

## 2016-04-12 DIAGNOSIS — Z951 Presence of aortocoronary bypass graft: Secondary | ICD-10-CM | POA: Insufficient documentation

## 2016-04-12 DIAGNOSIS — I25810 Atherosclerosis of coronary artery bypass graft(s) without angina pectoris: Secondary | ICD-10-CM | POA: Insufficient documentation

## 2016-04-12 DIAGNOSIS — Z87891 Personal history of nicotine dependence: Secondary | ICD-10-CM | POA: Insufficient documentation

## 2016-04-12 DIAGNOSIS — E039 Hypothyroidism, unspecified: Secondary | ICD-10-CM | POA: Diagnosis not present

## 2016-04-12 DIAGNOSIS — I110 Hypertensive heart disease with heart failure: Secondary | ICD-10-CM | POA: Diagnosis not present

## 2016-04-12 DIAGNOSIS — A419 Sepsis, unspecified organism: Secondary | ICD-10-CM | POA: Insufficient documentation

## 2016-04-12 DIAGNOSIS — R0602 Shortness of breath: Secondary | ICD-10-CM | POA: Diagnosis present

## 2016-04-12 DIAGNOSIS — I5023 Acute on chronic systolic (congestive) heart failure: Secondary | ICD-10-CM | POA: Diagnosis not present

## 2016-04-12 DIAGNOSIS — C7839 Secondary malignant neoplasm of other respiratory organs: Secondary | ICD-10-CM | POA: Diagnosis not present

## 2016-04-12 DIAGNOSIS — Z79899 Other long term (current) drug therapy: Secondary | ICD-10-CM | POA: Diagnosis not present

## 2016-04-12 DIAGNOSIS — Z7982 Long term (current) use of aspirin: Secondary | ICD-10-CM | POA: Insufficient documentation

## 2016-04-12 DIAGNOSIS — C799 Secondary malignant neoplasm of unspecified site: Secondary | ICD-10-CM

## 2016-04-12 DIAGNOSIS — J9601 Acute respiratory failure with hypoxia: Secondary | ICD-10-CM | POA: Insufficient documentation

## 2016-04-12 LAB — CBC WITH DIFFERENTIAL/PLATELET
BASOS PCT: 0 %
Basophils Absolute: 0 10*3/uL (ref 0.0–0.1)
EOS ABS: 0 10*3/uL (ref 0.0–0.7)
Eosinophils Relative: 0 %
HEMATOCRIT: 35.6 % — AB (ref 39.0–52.0)
HEMOGLOBIN: 10.7 g/dL — AB (ref 13.0–17.0)
Lymphocytes Relative: 10 %
Lymphs Abs: 0.9 10*3/uL (ref 0.7–4.0)
MCH: 25 pg — ABNORMAL LOW (ref 26.0–34.0)
MCHC: 30.1 g/dL (ref 30.0–36.0)
MCV: 83.2 fL (ref 78.0–100.0)
MONOS PCT: 9 %
Monocytes Absolute: 0.8 10*3/uL (ref 0.1–1.0)
NEUTROS ABS: 7.6 10*3/uL (ref 1.7–7.7)
NEUTROS PCT: 81 %
Platelets: 298 10*3/uL (ref 150–400)
RBC: 4.28 MIL/uL (ref 4.22–5.81)
RDW: 18.6 % — ABNORMAL HIGH (ref 11.5–15.5)
WBC: 9.4 10*3/uL (ref 4.0–10.5)

## 2016-04-12 LAB — COMPREHENSIVE METABOLIC PANEL
ALK PHOS: 116 U/L (ref 38–126)
ALT: 142 U/L — ABNORMAL HIGH (ref 17–63)
ANION GAP: 15 (ref 5–15)
AST: 195 U/L — ABNORMAL HIGH (ref 15–41)
Albumin: 3.1 g/dL — ABNORMAL LOW (ref 3.5–5.0)
BUN: 84 mg/dL — ABNORMAL HIGH (ref 6–20)
CALCIUM: 9 mg/dL (ref 8.9–10.3)
CHLORIDE: 106 mmol/L (ref 101–111)
CO2: 17 mmol/L — AB (ref 22–32)
CREATININE: 4.22 mg/dL — AB (ref 0.61–1.24)
GFR, EST AFRICAN AMERICAN: 14 mL/min — AB (ref >60)
GFR, EST NON AFRICAN AMERICAN: 12 mL/min — AB (ref >60)
Glucose, Bld: 92 mg/dL (ref 65–99)
Potassium: 7.7 mmol/L (ref 3.5–5.1)
SODIUM: 138 mmol/L (ref 135–145)
Total Bilirubin: 1.7 mg/dL — ABNORMAL HIGH (ref 0.3–1.2)
Total Protein: 6.5 g/dL (ref 6.5–8.1)

## 2016-04-12 LAB — I-STAT CG4 LACTIC ACID, ED: LACTIC ACID, VENOUS: 5.33 mmol/L — AB (ref 0.5–1.9)

## 2016-04-12 LAB — TROPONIN I: Troponin I: 0.07 ng/mL (ref <0.03)

## 2016-04-12 MED ORDER — DEXTROSE 5 % IV SOLN
500.0000 mg | Freq: Once | INTRAVENOUS | Status: AC
  Administered 2016-04-12: 500 mg via INTRAVENOUS
  Filled 2016-04-12: qty 500

## 2016-04-12 MED ORDER — SODIUM CHLORIDE 0.9 % IV BOLUS (SEPSIS)
1000.0000 mL | Freq: Once | INTRAVENOUS | Status: AC
  Administered 2016-04-12: 1000 mL via INTRAVENOUS

## 2016-04-12 MED ORDER — ATROPINE SULFATE 1 MG/ML IJ SOLN
INTRAMUSCULAR | Status: AC
  Filled 2016-04-12: qty 1

## 2016-04-12 MED ORDER — DOPAMINE-DEXTROSE 3.2-5 MG/ML-% IV SOLN
INTRAVENOUS | Status: AC
  Filled 2016-04-12: qty 250

## 2016-04-12 MED ORDER — DEXTROSE 5 % IV SOLN
1.0000 g | Freq: Once | INTRAVENOUS | Status: AC
  Administered 2016-04-12: 1 g via INTRAVENOUS
  Filled 2016-04-12: qty 10

## 2016-04-12 MED ORDER — SODIUM CHLORIDE 0.9 % IV BOLUS (SEPSIS)
500.0000 mL | Freq: Once | INTRAVENOUS | Status: AC
  Administered 2016-04-12: 500 mL via INTRAVENOUS

## 2016-04-16 ENCOUNTER — Ambulatory Visit: Payer: Medicare HMO | Admitting: Cardiology

## 2016-04-17 LAB — CULTURE, BLOOD (ROUTINE X 2)
CULTURE: NO GROWTH
Culture: NO GROWTH

## 2016-04-25 NOTE — ED Notes (Signed)
CRITICAL VALUE ALERT  Critical value received:  K 7.7, trop 0.07  Date of notification:  May 02, 2016  Time of notification:  1433  Critical value read back:Yes.    Nurse who received alert:  B. Olena Heckle, RN  MD notified (1st page):  Gilford Raid  Time of first page:  1433  MD notified (2nd page):  Time of second page:  Responding MD:  Gilford Raid  Time MD responded:  [phone]

## 2016-04-25 NOTE — ED Provider Notes (Signed)
[facility] DEPT Provider Note   CSN: [id number] Arrival date & time:        History   Chief Complaint Chief Complaint  Patient presents with  . Shortness of Breath    HPI [name] [name] is a 83 y.o. male.  Pt presents to the ED today with sob.  He has been sob for the past 2 days.  The pt said he's been sick since he required a CABG in June of 2017.  The pt also has a hx of metastatic cancer of the larynx.  He has urinary retention that has required foleys, but his foley has fallen out.  Pt is so sob that he is a poor historian.  The pt speaks in a whisper due to prior laryngeal cancer.      Past Medical History:  Diagnosis Date  . Bradycardia    a. 2/2 amio after CABG 2017  . CAD in native artery    a. NSTEMI/near-STEMI equivalent 08/2015 s/p CABG.  . Chronic combined systolic and diastolic CHF (congestive heart failure) ([place])   . DJD (degenerative joint disease)   . First degree heart block   . GERD (gastroesophageal reflux disease)   . H/O hiatal hernia   . History of cancer of larynx 1996--- S/P  REMOVAL 1/3 VOICE BOX  AND RADIATION TX   NO RECURRENCE---  RESIDUAL , PT SPEECH A WHISPER  . Hypertension   . Hypertensive heart disease   . Hyponatremia   . Hypothyroidism   . Non-STEMI (non-ST elevated myocardial infarction) ([place]) 09/18/2015  . Paroxysmal atrial fibrillation (HCC)    a. post op CABG 2017 -> brady/n/v on amiodarone; not anticoagulated due to advanced age per notes.  . Prostate cancer ([place]) STAGE T2a,     FOLLOWED BY DR Diona Fanti AND DR MANNING  . S/P CABG (coronary artery bypass graft)   . Weakness of voice PT CAN ONLY WHISPER---  SECONDARY TO VOCAL CORD CA  S/P REMOVAL 1/3  VOICE BOX    Patient Active Problem List   Diagnosis Date Noted  . Malnutrition of moderate degree 10/20/2015  . Elevated troponin I level 10/17/2015  . Hyponatremia 10/17/2015  . Anemia due to other cause 10/17/2015  . Hypothyroidism 10/17/2015  . Acute CHF (congestive  heart failure) ([place]) 10/17/2015  . S/P CABG (coronary artery bypass graft)   . CAD (coronary artery disease) 09/22/2015  . Coronary artery disease involving native coronary artery of native heart without angina pectoris   . Postinfarction angina ([place])   . Non-STEMI (non-ST elevated myocardial infarction) ([place]) 09/18/2015  . Hypertension 09/18/2015  . GERD (gastroesophageal reflux disease) 09/18/2015  . NSTEMI (non-ST elevated myocardial infarction) ([place]) 09/18/2015  . ST elevation (STEMI) myocardial infarction of unspecified site ([place]) 09/18/2015  . ST elevation myocardial infarction (STEMI) ([place])   . Malignant neoplasm of prostate ([place]) 08/22/2011    Past Surgical History:  Procedure Laterality Date  . CARDIAC CATHETERIZATION N/A 09/18/2015   Procedure: Left Heart Cath and Coronary Angiography;  Surgeon: Troy Sine, MD;  Location: [facility];  Service: Cardiovascular;  Laterality: N/A;  . CARDIAC CATHETERIZATION N/A 09/21/2015   Procedure: IABP Insertion;  Surgeon: Leonie Man, MD;  Location: [facility];  Service: Cardiovascular;  Laterality: N/A;  . CARDIOVASCULAR STRESS TEST  5 YRS AGO   PT STATES NORMAL  . CATARACT EXTRACTION W/ INTRAOCULAR LENS  IMPLANT, BILATERAL    . CORONARY ARTERY BYPASS GRAFT N/A 09/22/2015   Procedure: CORONARY  ARTERY BYPASS GRAFTING (CABG) TIMES  THREE  USING LEFT INTERNAL MAMMARY ARTERY AND RIGHT SAPHENOUS VEIN HARVESTED ENDOSCOPICALLY, CORONARY ENDARTERECTOMY;  Surgeon: Ivin Poot, MD;  Location: [facility];  Service: Open Heart Surgery;  Laterality: N/A;  . CYSTOSCOPY  08/02/2011   Procedure: CYSTOSCOPY FLEXIBLE;  Surgeon: Franchot Gallo, MD;  Location: [facility];  Service: Urology;  Laterality: N/A;  . LARYNX SURGERY  x3  1996   BX'S AND 1/3 OF VOICE BOX REMOVED DUE TO CANCER--  NO RECURRENCE---  (RESIDUAL , WHISPERS)  . RADIOACTIVE SEED IMPLANT  08/02/2011   Procedure: RADIOACTIVE SEED IMPLANT;  Surgeon: Franchot Gallo, MD;  [facility];  Service: Urology;  Laterality: N/A;  C-ARM RAD TECH OK PER [facility] AT MAIN  . TEE WITHOUT CARDIOVERSION N/A 09/22/2015   Procedure: TRANSESOPHAGEAL ECHOCARDIOGRAM (TEE);  Surgeon: Ivin Poot, MD;  Location: [facility];  Service: Open Heart Surgery;  Laterality: N/A;       Home Medications    Prior to Admission medications   Medication Sig Start Date End Date Taking? Authorizing Provider  albuterol (PROVENTIL) (2.5 MG/3ML) 0.083% nebulizer solution Take 2.5 mg by nebulization 3 (three) times daily.    Historical Provider, MD  aspirin EC 81 MG EC tablet Take 1 tablet (81 mg total) by mouth daily. 09/30/15   Erin R Barrett, PA-C  atorvastatin (LIPITOR) 80 MG tablet Take 1 tablet (80 mg total) by mouth daily at 6 PM. 02/12/16   Arnoldo Lenis, MD  Bil[place], Vaccinium myrtillus, (BILBERRY PO) Take 2 tablets by mouth daily.     Historical Provider, MD  carvedilol (COREG) 3.125 MG tablet Take 3.125 mg by mouth 2 (two) times daily with a meal.    Historical Provider, MD  ciprofloxacin (CIPRO) 500 MG tablet Take 500 mg by mouth 2 (two) times daily. 7 day course starting on 03/27/2015    Historical Provider, MD  GARLIC PO Take 1 tablet by mouth daily.    Historical Provider, MD  levothyroxine (SYNTHROID, LEVOTHROID) 50 MCG tablet Take 50 mcg by mouth every morning.    Historical Provider, MD  lisinopril (PRINIVIL,ZESTRIL) 20 MG tablet Take 1 tablet (20 mg total) by mouth daily. 10/21/15   Lucia Gaskins, MD  magnesium gluconate (MAGONATE) 500 MG tablet Take 500 mg by mouth at bedtime.     Historical Provider, MD  Multiple Vitamins-Minerals (PRESERVISION AREDS 2) CAPS Take 2 capsules by mouth daily.     Historical Provider, MD  omeprazole (PRILOSEC) 20 MG capsule Take 20 mg by mouth every morning.     Historical Provider, MD  potassium chloride (K-DUR) 10 MEQ tablet Take 20 mEq by mouth 2 (two) times daily.    Historical Provider, MD  torsemide  (DEMADEX) 20 MG tablet Take 1 tablet (20 mg total) by mouth every other day. Patient taking differently: Take 20 mg by mouth daily.  01/08/16 04/07/16  Arnoldo Lenis, MD    Family History Family History  Problem Relation Age of Onset  . Congestive Heart Failure Mother   . Stroke Father     Social History Social History  Substance Use Topics  . Smoking status: Former Smoker    Packs/day: 3.00    Years: 45.00    Types: Cigarettes    Quit date: 07/31/1990  . Smokeless tobacco: Never Used  . Alcohol use No     Allergies   Sulfa antibiotics and Contrast media [iodinated diagnostic agents]   Review of Systems Review  of Systems  Constitutional: Positive for fatigue.  Respiratory: Positive for cough and shortness of breath.   All other systems reviewed and are negative.    Physical Exam Updated Vital Signs BP (!) 59/40   Pulse 84   Temp (!) 94.5 F (34.7 C)   Resp (!) 0   Ht 6\' 1"  (1.854 m)   Wt 170 lb (77.1 kg)   SpO2 (!) 18%   BMI 22.43 kg/m   Physical Exam  Constitutional: He is oriented to person, place, and time. He appears well-developed. He appears distressed.  HENT:  Head: Normocephalic and atraumatic.  Right Ear: External ear normal.  Left Ear: External ear normal.  Nose: Nose normal.  Mouth/Throat: Oropharynx is clear and moist.  Eyes: Conjunctivae and EOM are normal. Pupils are equal, round, and reactive to light.  Neck: Normal range of motion. Neck supple.  Cardiovascular: Normal rate, regular rhythm, normal heart sounds and intact distal pulses.   Pulmonary/Chest: Accessory muscle usage present. Tachypnea noted. He is in respiratory distress. He has rhonchi.  Abdominal: Soft. Bowel sounds are normal.  Musculoskeletal: Normal range of motion.  Neurological: He is alert and oriented to person, place, and time.  Skin: Skin is warm.  Psychiatric: He has a normal mood and affect. His behavior is normal. Judgment and thought content normal.  Nursing  note and vitals reviewed.    ED Treatments / Results  Labs (all labs ordered are listed, but only abnormal results are displayed) Labs Reviewed  COMPREHENSIVE METABOLIC PANEL - Abnormal; Notable for the following:       Result Value   Potassium 7.7 (*)    CO2 17 (*)    BUN 84 (*)    Creatinine, Ser 4.22 (*)    Albumin 3.1 (*)    AST 195 (*)    ALT 142 (*)    Total Bilirubin 1.7 (*)    GFR calc non Af Amer 12 (*)    GFR calc Af Amer 14 (*)    All other components within normal limits  CBC WITH DIFFERENTIAL/PLATELET - Abnormal; Notable for the following:    Hemoglobin 10.7 (*)    HCT 35.6 (*)    MCH 25.0 (*)    RDW 18.6 (*)    All other components within normal limits  TROPONIN I - Abnormal; Notable for the following:    Troponin I 0.07 (*)    All other components within normal limits  I-STAT CG4 LACTIC ACID, ED - Abnormal; Notable for the following:    Lactic Acid, Venous 5.33 (*)    All other components within normal limits  CULTURE, BLOOD (ROUTINE X 2)  CULTURE, BLOOD (ROUTINE X 2)  URINE CULTURE  URINALYSIS, ROUTINE W REFLEX MICROSCOPIC  BLOOD GAS, ARTERIAL    EKG  EKG Interpretation  Date/Time:  04-19-16 13:30:59 EST Ventricular Rate:  49 PR Interval:    QRS Duration: 148 QT Interval:  519 QTC Calculation: 469 R Axis:   108 Text Interpretation:  Possible atrial arrhythmia IVCD, consider atypical RBBB Confirmed by [facility] MD, Anzel Kearse ([id number]) on 04/19/16 1:36:48 PM       Radiology Dg Chest Port 1 View  Result Date: April 19, 2016 CLINICAL DATA:  Dyspnea for 2 days. EXAM: PORTABLE CHEST 1 VIEW COMPARISON:  03/29/2016 chest radiograph. FINDINGS: Stable configuration of median sternotomy wires. Surgical clips overlie the right axilla. Stable cardiomediastinal silhouette with mild cardiomegaly. No pneumothorax. Small bilateral pleural effusions appear stable. Low lung volumes. Stable elevation of  the right hemidiaphragm. Probable mild pulmonary edema.  Increased hazy bibasilar lung opacities. IMPRESSION: 1. Mild cardiomegaly with probable mild pulmonary edema, suggesting mild congestive heart failure. 2. Stable small bilateral pleural effusions. 3. Low lung volumes. Increased hazy bibasilar lung opacities, favor atelectasis, cannot exclude a component of pneumonia or aspiration. Electronically Signed   By: Ilona Sorrel M.D.   On: Apr 14, 2016 13:28    Procedures Procedures (including critical care time)  Medications Ordered in ED Medications  DOPamine (INTROPIN) 3.2-5 MG/ML-% infusion (not administered)  atropine 1 MG/ML injection (not administered)  sodium chloride 0.9 % bolus 1,000 mL (0 mLs Intravenous Stopped Apr 14, 2016 1405)    And  sodium chloride 0.9 % bolus 1,000 mL (0 mLs Intravenous Stopped 04/14/16 1450)    And  sodium chloride 0.9 % bolus 500 mL (0 mLs Intravenous Stopped 2016-04-14 1450)  cefTRIAXone (ROCEPHIN) 1 g in dextrose 5 % 50 mL IVPB (0 g Intravenous Stopped 2016/04/14 1400)  azithromycin (ZITHROMAX) 500 mg in dextrose 5 % 250 mL IVPB (0 mg Intravenous Stopped 04/14/16 1450)     Initial Impression / Assessment and Plan / ED Course  I have reviewed the triage vital signs and the nursing notes.  Pertinent labs & imaging results that were available during my care of the patient were reviewed by me and considered in my medical decision making (see chart for details).  When pt arrived, I asked him if he wanted to be on a ventilator.  He initially said yes.  However, when family arrived, they said he would not want that.  We asked him again and he wrote down not right now.  The pt and the family was told that he would die if he was not intubated as his O2 sats were low on bipap.  The patient, family, and myself had multiple conversations regarding this.  The family again discussed it and decided to intubate him.  While we were gathering all the materials to intubate, pt stopped breathing and heart stopped.  Pt bagged and cpr started.  I  spoke with pt's wife and she said to stop.  We stopped and he was pronounced dead at 1450.  Pt d/w his PCP Dr. Lorriane Shire who will sign the death certificate.      CRITICAL CARE Performed by: Isla Pence   Total critical care time: 45 minutes  Critical care time was exclusive of separately billable procedures and treating other patients.  Critical care was necessary to treat or prevent imminent or life-threatening deterioration.  Critical care was time spent personally by me on the following activities: development of treatment plan with patient and/or surrogate as well as nursing, discussions with consultants, evaluation of patient's response to treatment, examination of patient, obtaining history from patient or surrogate, ordering and performing treatments and interventions, ordering and review of laboratory studies, ordering and review of radiographic studies, pulse oximetry and re-evaluation of patient's condition.   Final Clinical Impressions(s) / ED Diagnoses   Final diagnoses:  Acute respiratory failure with hypoxia (HCC)  Acute on chronic systolic congestive heart failure (HCC)  Sepsis, due to unspecified organism [facility])  Metastatic cancer [facility])    New Prescriptions New Prescriptions   No medications on file     Isla Pence, MD 2016-04-14 1534

## 2016-04-25 NOTE — ED Triage Notes (Signed)
Pt c/o sob x 2 days. Pt on NRB upon arrival. Pt ra saturation 60's on RA. NRB placed back on pt. Hypotensive. EDP at bedside.

## 2016-04-25 NOTE — ED Notes (Signed)
IV's removed and foley removed

## 2016-04-25 NOTE — ED Notes (Signed)
Time of death 1450, found that pt not breathing by EDP with HR in 50's, Code called and CPR started but within minutes CPR stopped due to family request to stop CPR

## 2016-04-25 DEATH — deceased

## 2018-09-09 IMAGING — CT CT CHEST W/O CM
2 of 3 series · 15 of 36 positions shown, 18 images · non-contrast
Comparison: Radiographs January 02, 2016. CT scan September 19, 2015.

CLINICAL DATA: Abnormal chest x-ray.

EXAM:
CT CHEST WITHOUT CONTRAST
TECHNIQUE: Multidetector CT imaging of the chest was performed following the
standard protocol without IV contrast.

[Series 3: thorax · axial · 0.70mm/px · z∈[-342,-90]mm · 12 of 150 slices shown, 15 images]
[im 12/150  mediastinal]
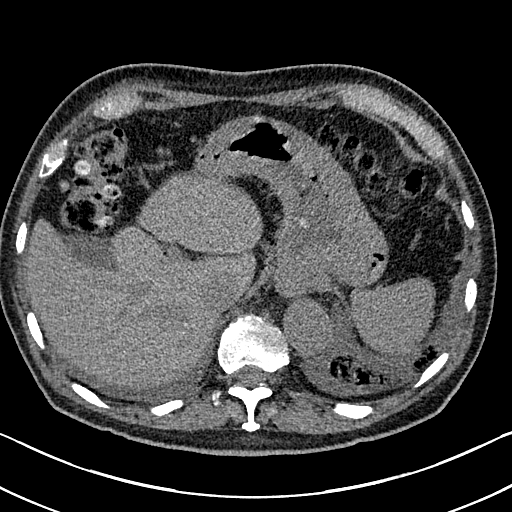
[im 12/150  lung]
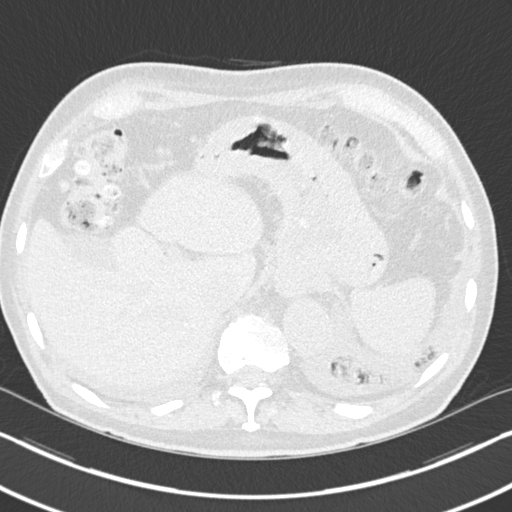
[im 23/150  lung]
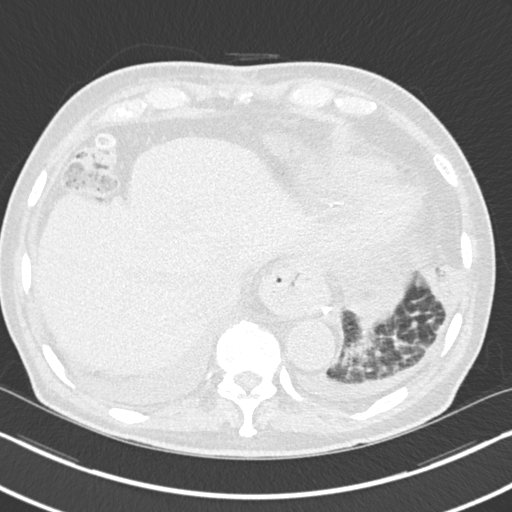
[im 34/150  lung]
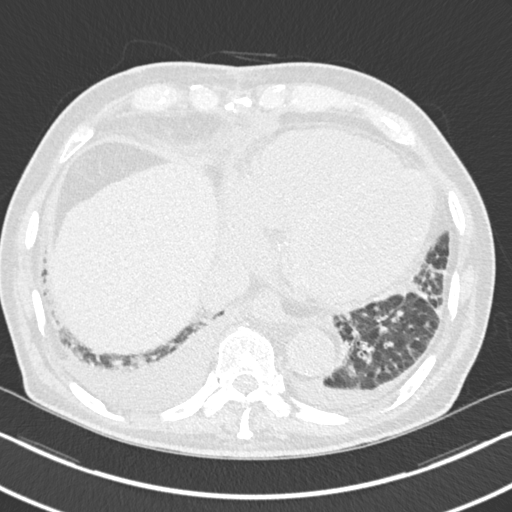
[im 45/150  lung]
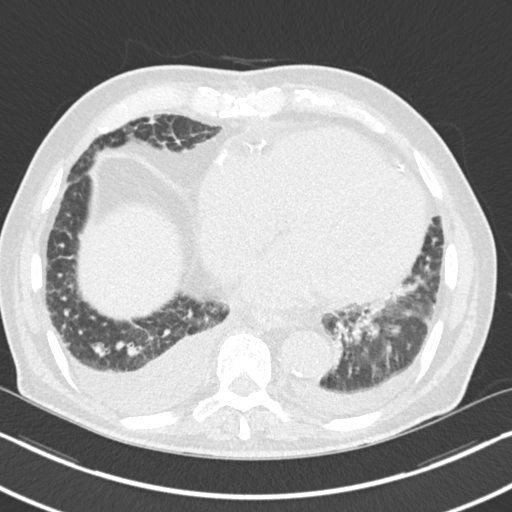
[im 56/150  mediastinal]
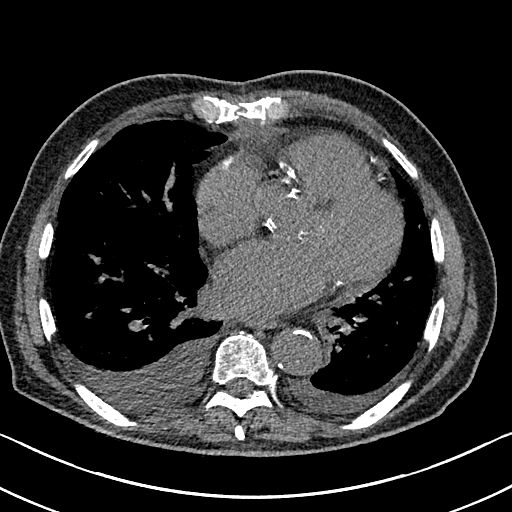
[im 56/150  lung]
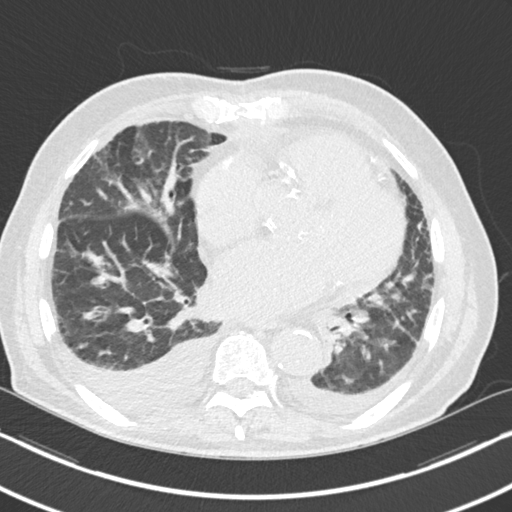
[im 67/150  lung]
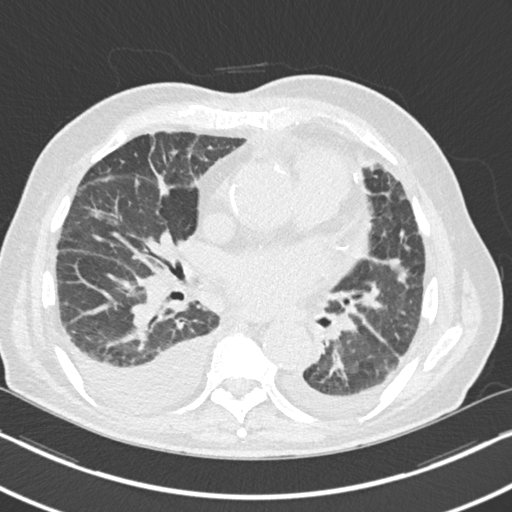
[im 83/150  lung]
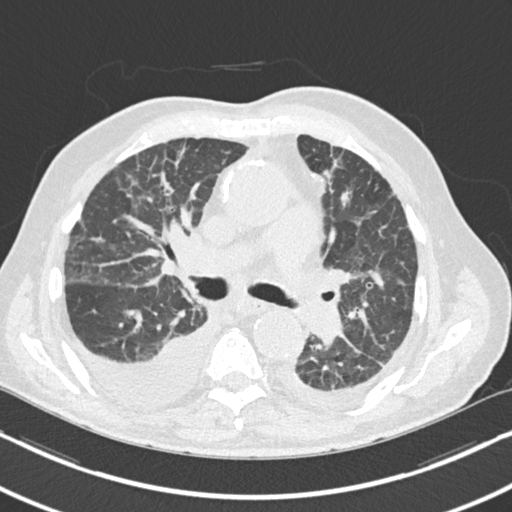
[im 94/150  lung]
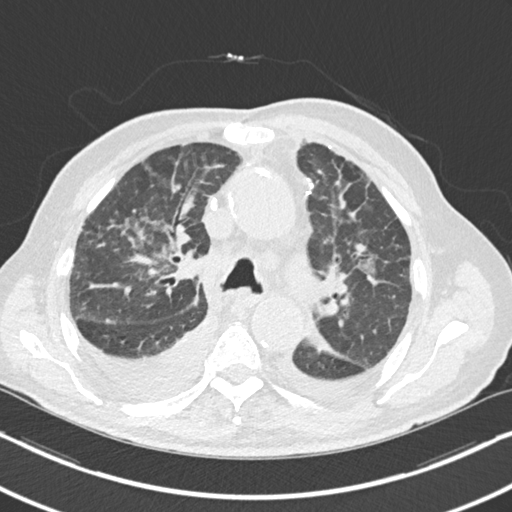
[im 105/150  mediastinal]
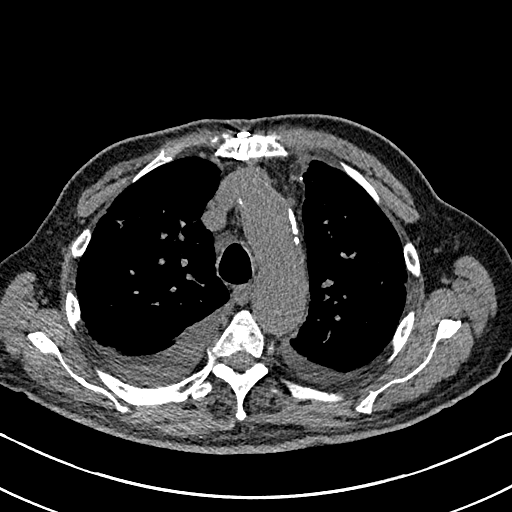
[im 105/150  lung]
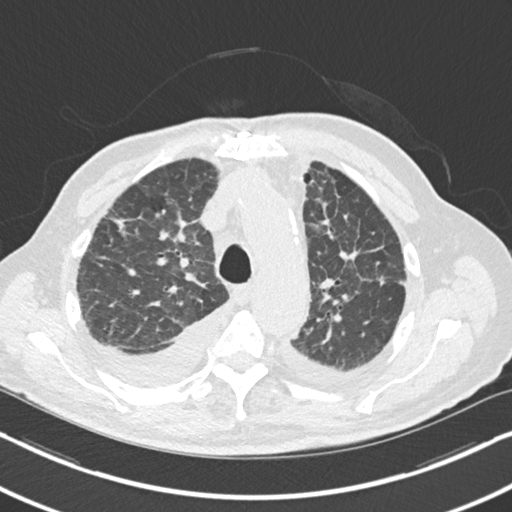
[im 116/150  lung]
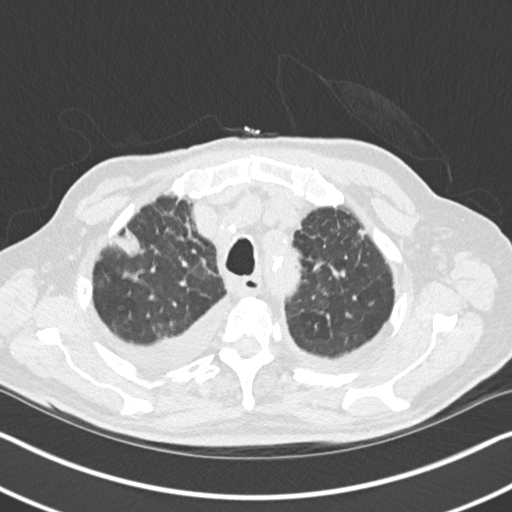
[im 127/150  lung]
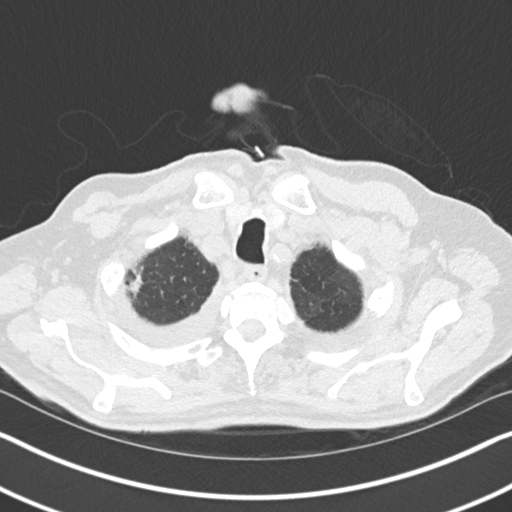
[im 138/150  lung]
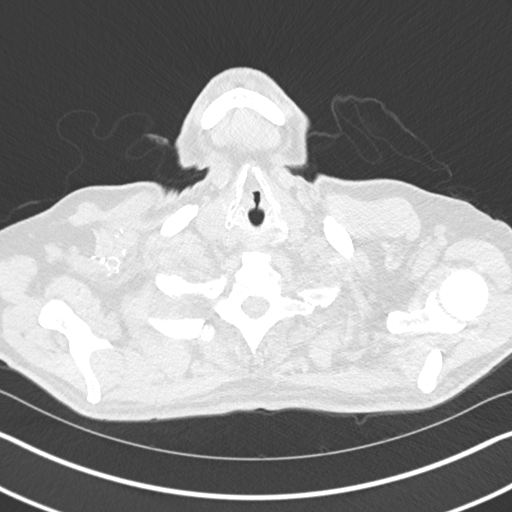

[Series 6: coronal · coronal · 0.61mm/px · 3 of 149 slices shown]
[im 30/149  lung]
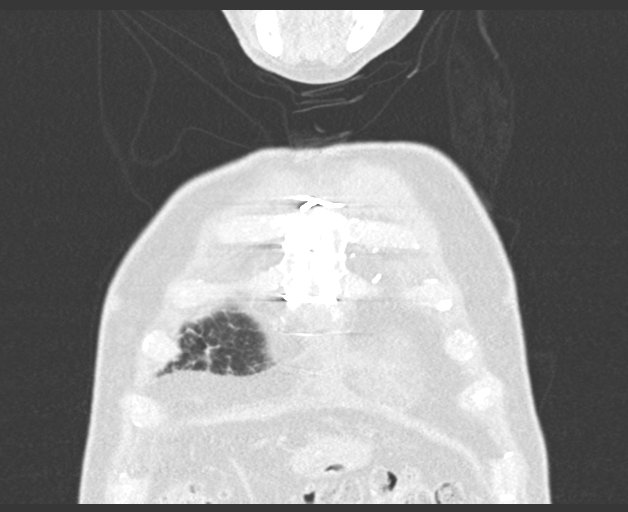
[im 60/149  lung]
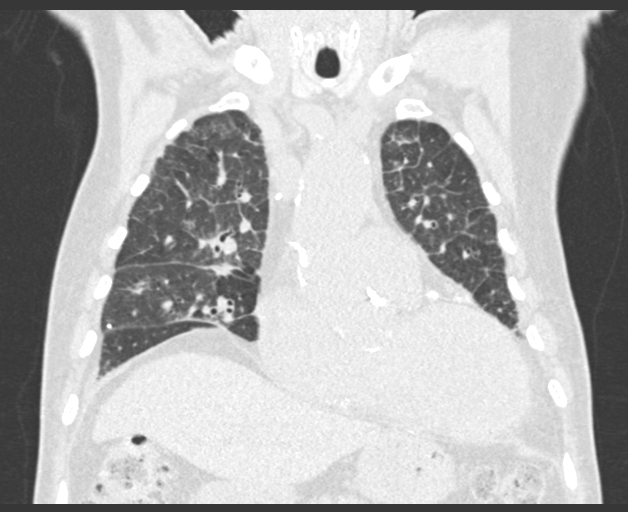
[im 89/149  lung]
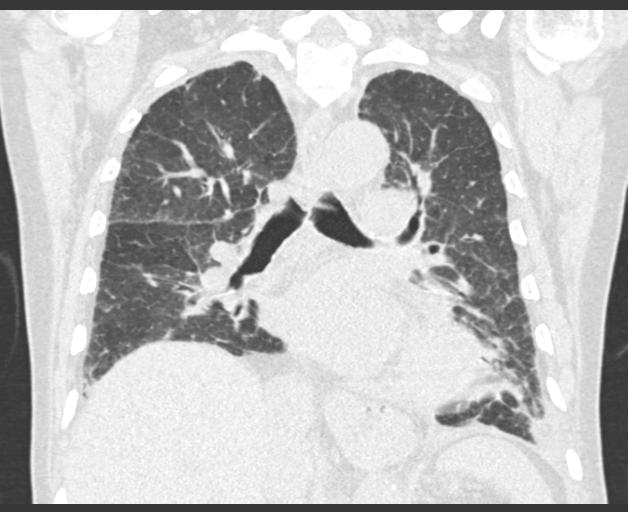

[15 of 36 positions shown; findings below may reference images not displayed]

FINDINGS: Cardiovascular: Atherosclerosis of thoracic aorta is noted. 4.8 cm
ascending thoracic aortic aneurysm is noted. Status post coronary
artery bypass graft.

Mediastinum/Nodes: Stable mildly enlarged adenopathy is noted which
most likely is reactive in etiology.

Lungs/Pleura: Moderate right pleural effusion is noted. Minimal left
pleural effusion is noted. Increased interstitial densities are
noted throughout both lungs which may represent edema or possibly
inflammation. 2.0 x 1.6 cm pleural-based density is seen laterally
in right upper lobe which was not present on prior exam and most
likely represents focal atelectasis or inflammation.

Upper Abdomen: Visualized portion of upper abdomen is unremarkable.

Musculoskeletal: No significant osseous abnormality is noted.
IMPRESSION: 4.8 cm ascending thoracic aortic aneurysm is noted. Ascending
thoracic aortic aneurysm. Recommend semi-annual imaging followup by
CTA or MRA and referral to cardiothoracic surgery if not already
obtained. This recommendation follows 2393
ACCF/AHA/AATS/ACR/ASA/SCA/NIEKY/KARTHIK/ENDJALA/TIGER Guidelines for the
Diagnosis and Management of Patients With Thoracic Aortic Disease.
Circulation. 2393; 121: [id number].

Stable mediastinal adenopathy is noted which most likely is reactive
in etiology.

Moderate right pleural effusion is noted.

Mildly increased interstitial densities are noted throughout both
lungs which may represent edema or possibly atypical inflammation.

2 cm pleural base density is seen laterally in right upper lobe
which was not present on prior exam and most likely represents focal
atelectasis or inflammation. However, follow-up CT scan in 2-3 weeks
is recommended to ensure resolution and rule out possible underlying
neoplasm.

## 2018-10-26 IMAGING — DX DG CHEST 2V
2 series · 2 of 2 positions shown · non-contrast
Comparison: Chest x-ray of 02/13/2016

CLINICAL DATA: Congestive heart failure, hoarseness, history of
carcinoma of the throat several years ago

EXAM:
CHEST  2 VIEW

[chest pa]
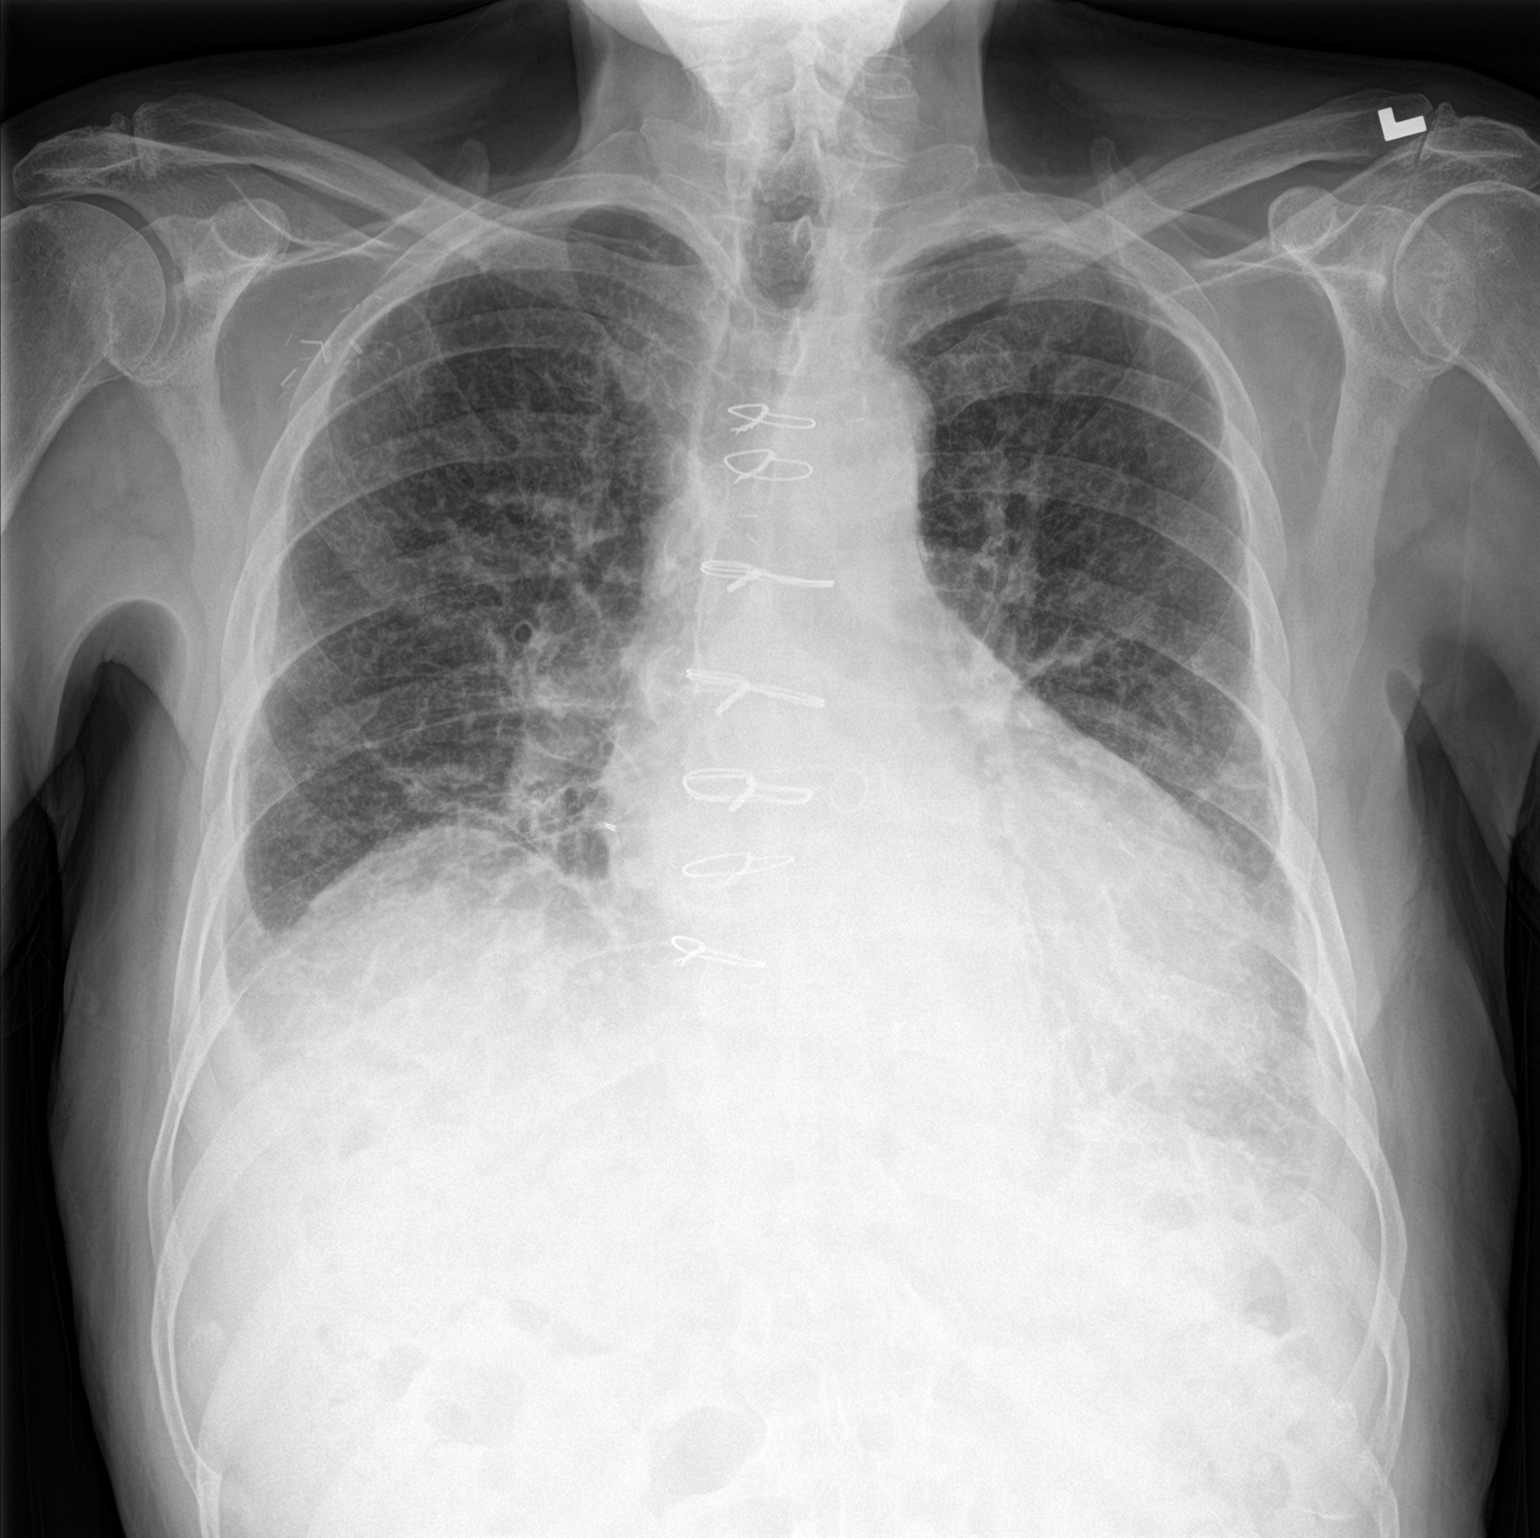

[chest lat]
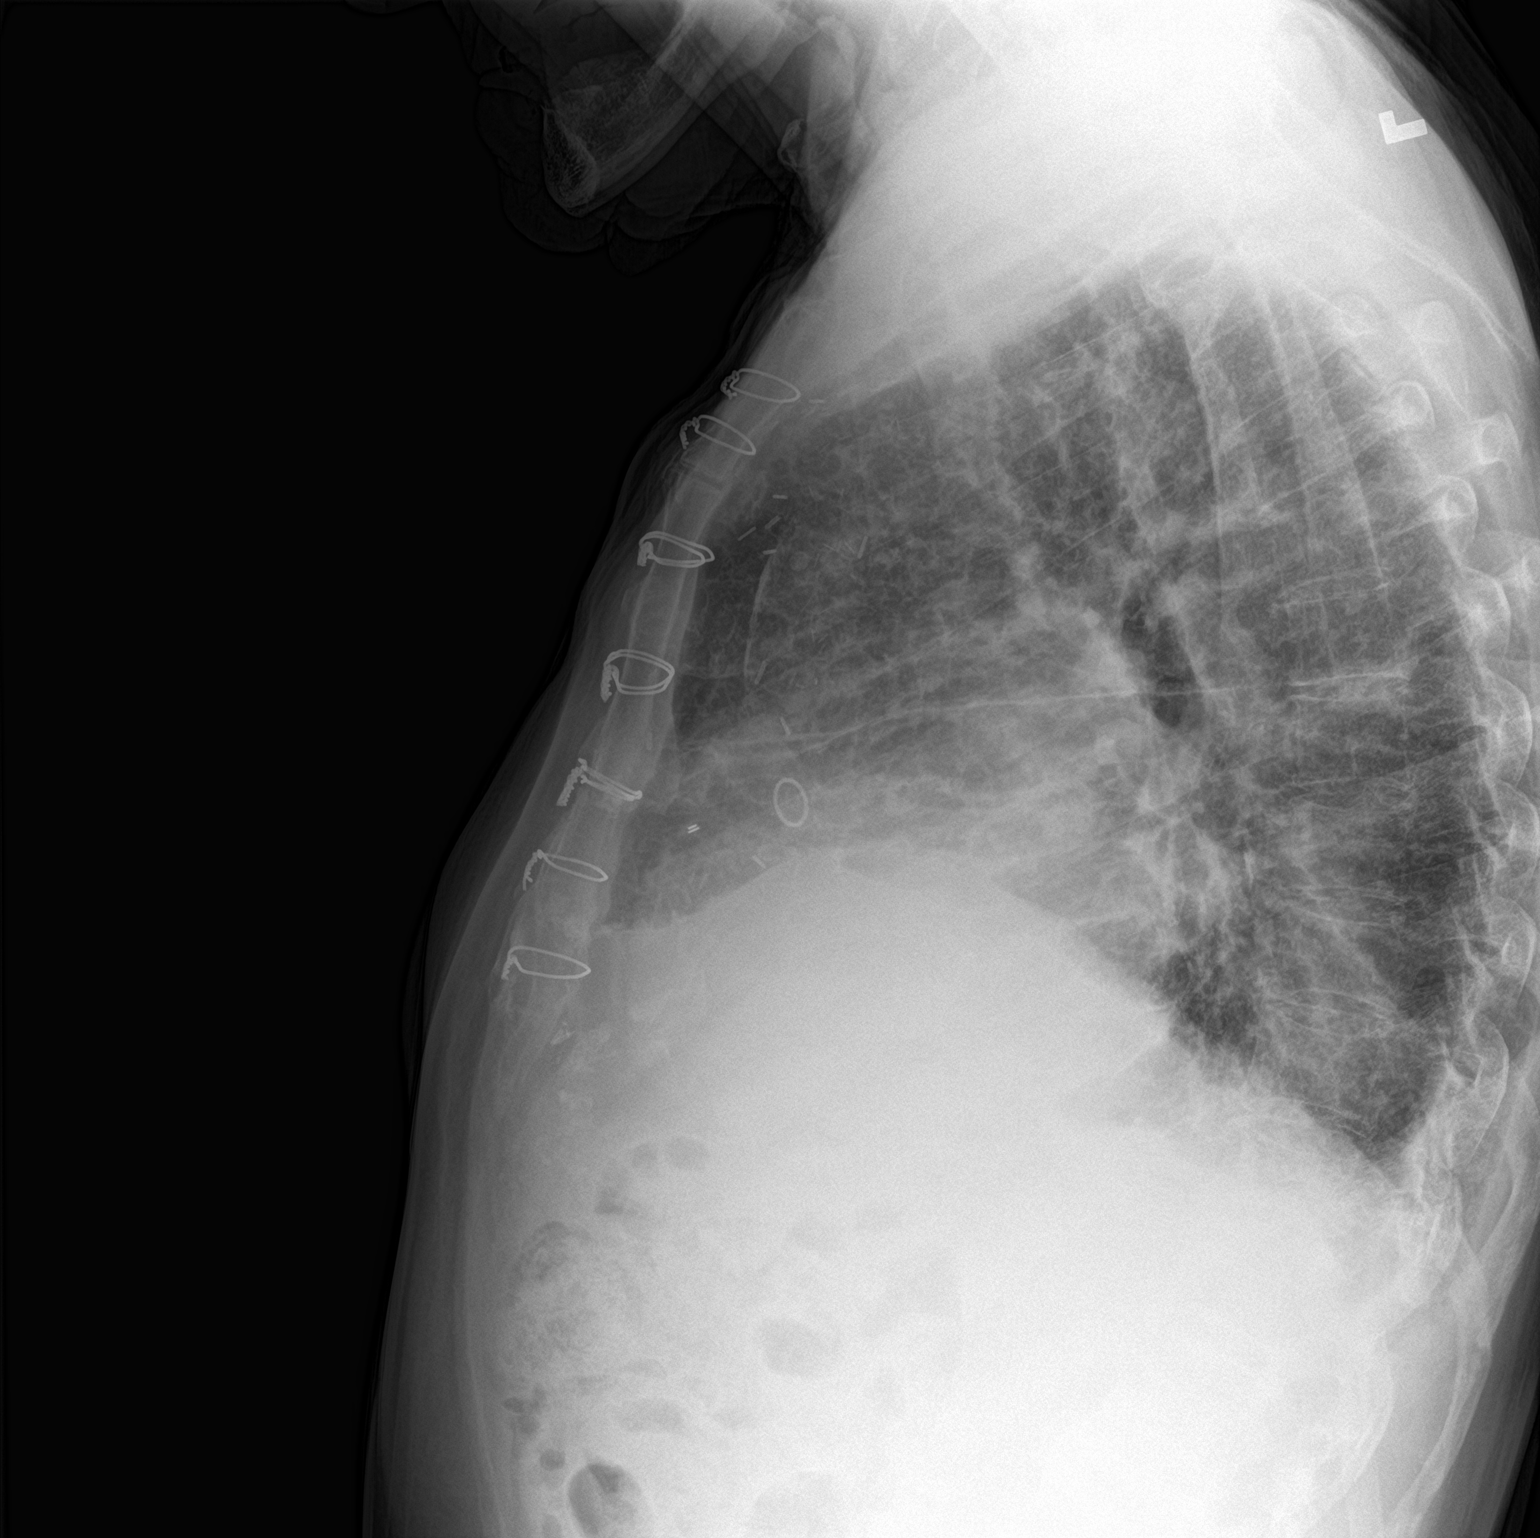

[2 of 2 positions shown; findings below may reference images not displayed]

FINDINGS: Coarse prominent interstitial markings remain diffusely most
consistent with chronic interstitial lung disease. No present
infiltrate or evidence of edema is seen. No pleural effusion is
noted. Cardiomegaly is stable. Median sternotomy sutures are noted
from prior CABG. No acute bony abnormality is seen.
IMPRESSION: Stable chronic fibrotic change. No active process. Stable
cardiomegaly.
